# Patient Record
Sex: Male | Born: 1960 | Race: White | Hispanic: No | Marital: Married | Smoking: Current every day smoker
Health system: Southern US, Community
[De-identification: ages and names within clinical notes are randomized; demographics above are authoritative.]

## PROBLEM LIST (undated history)

## (undated) DIAGNOSIS — D6959 Other secondary thrombocytopenia: Secondary | ICD-10-CM

## (undated) DIAGNOSIS — K7030 Alcoholic cirrhosis of liver without ascites: Secondary | ICD-10-CM

## (undated) DIAGNOSIS — G40209 Localization-related (focal) (partial) symptomatic epilepsy and epileptic syndromes with complex partial seizures, not intractable, without status epilepticus: Secondary | ICD-10-CM

## (undated) DIAGNOSIS — F1010 Alcohol abuse, uncomplicated: Secondary | ICD-10-CM

## (undated) DIAGNOSIS — C220 Liver cell carcinoma: Secondary | ICD-10-CM

## (undated) DIAGNOSIS — L03115 Cellulitis of right lower limb: Secondary | ICD-10-CM

## (undated) DIAGNOSIS — E669 Obesity, unspecified: Secondary | ICD-10-CM

## (undated) DIAGNOSIS — H359 Unspecified retinal disorder: Secondary | ICD-10-CM

## (undated) DIAGNOSIS — R569 Unspecified convulsions: Secondary | ICD-10-CM

## (undated) DIAGNOSIS — I10 Essential (primary) hypertension: Secondary | ICD-10-CM

## (undated) DIAGNOSIS — Z22322 Carrier or suspected carrier of Methicillin resistant Staphylococcus aureus: Secondary | ICD-10-CM

## (undated) DIAGNOSIS — F329 Major depressive disorder, single episode, unspecified: Secondary | ICD-10-CM

## (undated) DIAGNOSIS — Z720 Tobacco use: Secondary | ICD-10-CM

## (undated) DIAGNOSIS — Z95828 Presence of other vascular implants and grafts: Secondary | ICD-10-CM

## (undated) DIAGNOSIS — G4730 Sleep apnea, unspecified: Secondary | ICD-10-CM

## (undated) DIAGNOSIS — I719 Aortic aneurysm of unspecified site, without rupture: Secondary | ICD-10-CM

## (undated) DIAGNOSIS — H35381 Toxic maculopathy, right eye: Secondary | ICD-10-CM

## (undated) DIAGNOSIS — H02403 Unspecified ptosis of bilateral eyelids: Secondary | ICD-10-CM

## (undated) DIAGNOSIS — L97909 Non-pressure chronic ulcer of unspecified part of unspecified lower leg with unspecified severity: Secondary | ICD-10-CM

## (undated) DIAGNOSIS — K766 Portal hypertension: Secondary | ICD-10-CM

## (undated) DIAGNOSIS — G621 Alcoholic polyneuropathy: Secondary | ICD-10-CM

## (undated) DIAGNOSIS — F1210 Cannabis abuse, uncomplicated: Secondary | ICD-10-CM

## (undated) DIAGNOSIS — K7682 Hepatic encephalopathy: Secondary | ICD-10-CM

## (undated) DIAGNOSIS — Z961 Presence of intraocular lens: Secondary | ICD-10-CM

## (undated) DIAGNOSIS — N529 Male erectile dysfunction, unspecified: Secondary | ICD-10-CM

## (undated) DIAGNOSIS — J449 Chronic obstructive pulmonary disease, unspecified: Secondary | ICD-10-CM

## (undated) DIAGNOSIS — L03116 Cellulitis of left lower limb: Secondary | ICD-10-CM

## (undated) DIAGNOSIS — Z9109 Other allergy status, other than to drugs and biological substances: Secondary | ICD-10-CM

## (undated) DIAGNOSIS — K7290 Hepatic failure, unspecified without coma: Secondary | ICD-10-CM

## (undated) DIAGNOSIS — E785 Hyperlipidemia, unspecified: Secondary | ICD-10-CM

## (undated) DIAGNOSIS — F419 Anxiety disorder, unspecified: Secondary | ICD-10-CM

## (undated) DIAGNOSIS — F32A Depression, unspecified: Secondary | ICD-10-CM

## (undated) HISTORY — PX: EYE SURGERY: SHX253

## (undated) HISTORY — PX: COLONOSCOPY: SHX174

## (undated) HISTORY — PX: LIVER SURGERY: SHX698

## (undated) HISTORY — PX: OTHER SURGICAL HISTORY: SHX169

## (undated) HISTORY — PX: TIPS PROCEDURE: SHX808

## (undated) HISTORY — PX: ESOPHAGOGASTRODUODENOSCOPY: SHX1529

## (undated) HISTORY — PX: BREAST SURGERY: SHX581

---

## 2007-09-19 ENCOUNTER — Emergency Department (HOSPITAL_COMMUNITY): Admission: EM | Admit: 2007-09-19 | Discharge: 2007-09-19 | Payer: Self-pay | Admitting: Emergency Medicine

## 2007-09-20 ENCOUNTER — Encounter (INDEPENDENT_AMBULATORY_CARE_PROVIDER_SITE_OTHER): Payer: Self-pay | Admitting: Emergency Medicine

## 2007-09-20 ENCOUNTER — Ambulatory Visit (HOSPITAL_COMMUNITY): Admission: RE | Admit: 2007-09-20 | Discharge: 2007-09-20 | Payer: Self-pay | Admitting: Emergency Medicine

## 2007-09-20 ENCOUNTER — Ambulatory Visit: Payer: Self-pay | Admitting: Surgery

## 2008-02-04 ENCOUNTER — Emergency Department (HOSPITAL_COMMUNITY): Admission: EM | Admit: 2008-02-04 | Discharge: 2008-02-05 | Payer: Self-pay | Admitting: Emergency Medicine

## 2010-04-27 LAB — TROPONIN I: Troponin I: <0.01 ng/mL (ref 0.00–0.06)

## 2010-04-27 LAB — URINALYSIS, ROUTINE W REFLEX MICROSCOPIC
Glucose, UA: NEGATIVE mg/dL
Nitrite: NEGATIVE
Protein, ur: NEGATIVE mg/dL
pH: 7 (ref 5.0–8.0)

## 2010-04-27 LAB — BASIC METABOLIC PANEL
BUN: 11 mg/dL (ref 6–23)
CO2: 26 mEq/L (ref 19–32)
Chloride: 108 mEq/L (ref 96–112)
Glucose, Bld: 103 mg/dL — ABNORMAL HIGH (ref 70–99)
Potassium: 4.5 mEq/L (ref 3.5–5.1)

## 2010-04-27 LAB — DIFFERENTIAL
Eosinophils Absolute: 0.3 10*3/uL (ref 0.0–0.7)
Eosinophils Relative: 3 % (ref 0–5)
Lymphocytes Relative: 42 % (ref 12–46)
Lymphs Abs: 3.2 10*3/uL (ref 0.7–4.0)
Monocytes Relative: 12 % (ref 3–12)

## 2010-04-27 LAB — CBC
HCT: 32.3 % — ABNORMAL LOW (ref 39.0–52.0)
MCV: 100.3 fL — ABNORMAL HIGH (ref 78.0–100.0)
Platelets: 109 10*3/uL — ABNORMAL LOW (ref 150–400)
RDW: 13.7 % (ref 11.5–15.5)

## 2010-04-27 LAB — POCT CARDIAC MARKERS
CKMB, poc: 2.2 ng/mL (ref 1.0–8.0)
CKMB, poc: 2.4 ng/mL (ref 1.0–8.0)
Myoglobin, poc: 52.5 ng/mL (ref 12–200)

## 2010-04-27 LAB — D-DIMER, QUANTITATIVE: D-Dimer, Quant: 0.27 ug/mL-FEU (ref 0.00–0.48)

## 2010-04-27 LAB — PROTIME-INR: INR: 1.2 (ref 0.00–1.49)

## 2010-05-25 ENCOUNTER — Inpatient Hospital Stay: Payer: Self-pay | Admitting: Internal Medicine

## 2010-05-25 DIAGNOSIS — R7989 Other specified abnormal findings of blood chemistry: Secondary | ICD-10-CM

## 2010-05-25 DIAGNOSIS — I517 Cardiomegaly: Secondary | ICD-10-CM

## 2010-07-12 ENCOUNTER — Emergency Department: Payer: Self-pay | Admitting: Emergency Medicine

## 2010-07-19 ENCOUNTER — Inpatient Hospital Stay: Payer: Self-pay | Admitting: Internal Medicine

## 2010-07-20 DIAGNOSIS — R7989 Other specified abnormal findings of blood chemistry: Secondary | ICD-10-CM

## 2010-08-25 ENCOUNTER — Emergency Department: Payer: Self-pay | Admitting: Emergency Medicine

## 2010-10-14 LAB — COMPREHENSIVE METABOLIC PANEL
ALT: 59 — ABNORMAL HIGH
AST: 73 — ABNORMAL HIGH
Albumin: 3.4 — ABNORMAL LOW
CO2: 28
Chloride: 106
Creatinine, Ser: 0.5
GFR calc Af Amer: >60
GFR calc non Af Amer: >60
Potassium: 4.1
Sodium: 140
Total Bilirubin: 2.2 — ABNORMAL HIGH

## 2010-10-14 LAB — URINALYSIS, ROUTINE W REFLEX MICROSCOPIC
Hgb urine dipstick: NEGATIVE
Nitrite: NEGATIVE
Protein, ur: NEGATIVE
Specific Gravity, Urine: 1.012
Urobilinogen, UA: 1

## 2010-10-14 LAB — CBC
Platelets: 108 — ABNORMAL LOW
RBC: 3.79 — ABNORMAL LOW
WBC: 6.3

## 2010-10-14 LAB — AMMONIA: Ammonia: 63 — ABNORMAL HIGH

## 2010-10-14 LAB — DIFFERENTIAL
Basophils Absolute: 0.1
Eosinophils Absolute: 0.2
Eosinophils Relative: 3
Lymphocytes Relative: 38
Monocytes Absolute: 0.9

## 2010-10-14 LAB — APTT: aPTT: 36

## 2011-05-28 ENCOUNTER — Emergency Department: Payer: Self-pay | Admitting: Emergency Medicine

## 2011-05-28 LAB — CBC
HCT: 42.2 % (ref 40.0–52.0)
HGB: 14.7 g/dL (ref 13.0–18.0)
MCH: 37.6 pg — ABNORMAL HIGH (ref 26.0–34.0)
MCV: 108 fL — ABNORMAL HIGH (ref 80–100)
RBC: 3.91 10*6/uL — ABNORMAL LOW (ref 4.40–5.90)
RDW: 14.3 % (ref 11.5–14.5)

## 2011-05-28 LAB — URINALYSIS, COMPLETE
Bilirubin,UR: NEGATIVE
Ketone: NEGATIVE
Nitrite: NEGATIVE
Ph: 6 (ref 4.5–8.0)
Protein: 30
RBC,UR: 2 /HPF (ref 0–5)
Specific Gravity: 1.017 (ref 1.003–1.030)
Squamous Epithelial: NONE SEEN

## 2011-05-28 LAB — DRUG SCREEN, URINE
Cannabinoid 50 Ng, Ur ~~LOC~~: POSITIVE (ref ?–50)
Cocaine Metabolite,Ur ~~LOC~~: NEGATIVE (ref ?–300)
Methadone, Ur Screen: NEGATIVE (ref ?–300)
Phencyclidine (PCP) Ur S: NEGATIVE (ref ?–25)

## 2011-05-28 LAB — COMPREHENSIVE METABOLIC PANEL
Albumin: 3 g/dL — ABNORMAL LOW (ref 3.4–5.0)
Anion Gap: 9 (ref 7–16)
Chloride: 105 mmol/L (ref 98–107)
Co2: 27 mmol/L (ref 21–32)
Creatinine: 0.57 mg/dL — ABNORMAL LOW (ref 0.60–1.30)
EGFR (African American): >60
EGFR (Non-African Amer.): >60
Glucose: 102 mg/dL — ABNORMAL HIGH (ref 65–99)
Osmolality: 278 (ref 275–301)
SGOT(AST): 123 U/L — ABNORMAL HIGH (ref 15–37)
SGPT (ALT): 61 U/L
Total Protein: 7.6 g/dL (ref 6.4–8.2)

## 2011-05-28 LAB — SALICYLATE LEVEL: Salicylates, Serum: <1.7 mg/dL

## 2011-05-28 LAB — ETHANOL
Ethanol %: 0.261 % — ABNORMAL HIGH (ref 0.000–0.080)
Ethanol: 261 mg/dL
Ethanol: 155 mg/dL

## 2011-07-10 ENCOUNTER — Inpatient Hospital Stay: Payer: Self-pay | Admitting: Internal Medicine

## 2011-07-10 LAB — COMPREHENSIVE METABOLIC PANEL
Albumin: 2.7 g/dL — ABNORMAL LOW (ref 3.4–5.0)
Alkaline Phosphatase: 154 U/L — ABNORMAL HIGH (ref 50–136)
Anion Gap: 9 (ref 7–16)
Co2: 27 mmol/L (ref 21–32)
Creatinine: 0.58 mg/dL — ABNORMAL LOW (ref 0.60–1.30)
EGFR (African American): >60
Glucose: 123 mg/dL — ABNORMAL HIGH (ref 65–99)
Osmolality: 277 (ref 275–301)
Potassium: 3.3 mmol/L — ABNORMAL LOW (ref 3.5–5.1)
SGPT (ALT): 51 U/L

## 2011-07-10 LAB — CBC
HGB: 12.3 g/dL — ABNORMAL LOW (ref 13.0–18.0)
MCH: 37.4 pg — ABNORMAL HIGH (ref 26.0–34.0)
MCV: 105 fL — ABNORMAL HIGH (ref 80–100)
RDW: 13.7 % (ref 11.5–14.5)
WBC: 5.8 10*3/uL (ref 3.8–10.6)

## 2011-07-10 LAB — URINALYSIS, COMPLETE
Bilirubin,UR: NEGATIVE
Glucose,UR: NEGATIVE mg/dL (ref 0–75)
Ph: 8 (ref 4.5–8.0)
RBC,UR: 4 /HPF (ref 0–5)
Specific Gravity: 1.024 (ref 1.003–1.030)
Squamous Epithelial: NONE SEEN

## 2011-07-10 LAB — DRUG SCREEN, URINE
Amphetamines, Ur Screen: NEGATIVE (ref ?–1000)
Cocaine Metabolite,Ur ~~LOC~~: NEGATIVE (ref ?–300)
MDMA (Ecstasy)Ur Screen: NEGATIVE (ref ?–500)
Methadone, Ur Screen: NEGATIVE (ref ?–300)
Opiate, Ur Screen: NEGATIVE (ref ?–300)
Tricyclic, Ur Screen: NEGATIVE (ref ?–1000)

## 2011-07-10 LAB — ACETAMINOPHEN LEVEL: Acetaminophen: <2 ug/mL

## 2011-07-10 LAB — ETHANOL
Ethanol %: <0.003 % (ref 0.000–0.080)
Ethanol: <3 mg/dL

## 2011-07-10 LAB — MAGNESIUM: Magnesium: 1.8 mg/dL

## 2011-07-10 LAB — CK TOTAL AND CKMB (NOT AT ARMC)
CK, Total: 906 U/L — ABNORMAL HIGH (ref 35–232)
CK-MB: 3.9 ng/mL — ABNORMAL HIGH (ref 0.5–3.6)
CK-MB: 3.8 ng/mL — ABNORMAL HIGH (ref 0.5–3.6)

## 2011-07-10 LAB — TROPONIN I: Troponin-I: 0.26 ng/mL — ABNORMAL HIGH

## 2011-07-11 LAB — CBC WITH DIFFERENTIAL/PLATELET
Basophil #: 0.2 10*3/uL — ABNORMAL HIGH (ref 0.0–0.1)
Basophil %: 3.3 %
Eosinophil #: 0 10*3/uL (ref 0.0–0.7)
HCT: 30.1 % — ABNORMAL LOW (ref 40.0–52.0)
HGB: 10.6 g/dL — ABNORMAL LOW (ref 13.0–18.0)
Lymphocyte %: 32.5 %
MCH: 36.8 pg — ABNORMAL HIGH (ref 26.0–34.0)
Monocyte %: 11.7 %
Neutrophil %: 51.7 %
RBC: 2.89 10*6/uL — ABNORMAL LOW (ref 4.40–5.90)
RDW: 14.5 % (ref 11.5–14.5)
WBC: 4.7 10*3/uL (ref 3.8–10.6)

## 2011-07-11 LAB — COMPREHENSIVE METABOLIC PANEL
Anion Gap: 7 (ref 7–16)
BUN: 6 mg/dL — ABNORMAL LOW (ref 7–18)
Bilirubin,Total: 8.8 mg/dL — ABNORMAL HIGH (ref 0.2–1.0)
Calcium, Total: 7.5 mg/dL — ABNORMAL LOW (ref 8.5–10.1)
Creatinine: 0.74 mg/dL (ref 0.60–1.30)
EGFR (African American): >60
EGFR (Non-African Amer.): >60
Osmolality: 285 (ref 275–301)
SGPT (ALT): 44 U/L

## 2011-07-11 LAB — MAGNESIUM: Magnesium: 1.6 mg/dL — ABNORMAL LOW

## 2011-07-11 LAB — CK TOTAL AND CKMB (NOT AT ARMC): CK, Total: 590 U/L — ABNORMAL HIGH (ref 35–232)

## 2011-07-11 LAB — PHENYTOIN LEVEL, TOTAL: Dilantin: 8.7 ug/mL — ABNORMAL LOW (ref 10.0–20.0)

## 2011-07-11 LAB — POTASSIUM: Potassium: 3.6 mmol/L (ref 3.5–5.1)

## 2011-07-12 ENCOUNTER — Ambulatory Visit: Payer: Self-pay | Admitting: Internal Medicine

## 2011-07-12 LAB — CBC WITH DIFFERENTIAL/PLATELET
Basophil #: 0.1 10*3/uL (ref 0.0–0.1)
Eosinophil #: 0 10*3/uL (ref 0.0–0.7)
Eosinophil %: 1 %
HCT: 29.5 % — ABNORMAL LOW (ref 40.0–52.0)
Lymphocyte %: 28.1 %
MCH: 36.8 pg — ABNORMAL HIGH (ref 26.0–34.0)
MCHC: 34.6 g/dL (ref 32.0–36.0)
Monocyte %: 11.8 %
RDW: 14.8 % — ABNORMAL HIGH (ref 11.5–14.5)

## 2011-07-12 LAB — BASIC METABOLIC PANEL
BUN: 4 mg/dL — ABNORMAL LOW (ref 7–18)
Co2: 24 mmol/L (ref 21–32)
Creatinine: 0.66 mg/dL (ref 0.60–1.30)
EGFR (African American): >60
EGFR (Non-African Amer.): >60
Glucose: 108 mg/dL — ABNORMAL HIGH (ref 65–99)
Sodium: 141 mmol/L (ref 136–145)

## 2011-07-13 LAB — BASIC METABOLIC PANEL
BUN: 4 mg/dL — ABNORMAL LOW (ref 7–18)
Calcium, Total: 7.3 mg/dL — ABNORMAL LOW (ref 8.5–10.1)
Chloride: 109 mmol/L — ABNORMAL HIGH (ref 98–107)
Creatinine: 0.26 mg/dL — ABNORMAL LOW (ref 0.60–1.30)
EGFR (African American): >60
EGFR (Non-African Amer.): >60
Osmolality: 271 (ref 275–301)
Potassium: 3.8 mmol/L (ref 3.5–5.1)

## 2011-07-13 LAB — URINALYSIS, COMPLETE
Ketone: NEGATIVE
Leukocyte Esterase: NEGATIVE
Protein: NEGATIVE
Specific Gravity: 1.018 (ref 1.003–1.030)
Squamous Epithelial: NONE SEEN

## 2011-07-14 LAB — BASIC METABOLIC PANEL
BUN: 5 mg/dL — ABNORMAL LOW (ref 7–18)
Calcium, Total: 7.5 mg/dL — ABNORMAL LOW (ref 8.5–10.1)
Chloride: 104 mmol/L (ref 98–107)
Co2: 25 mmol/L (ref 21–32)
Creatinine: 0.88 mg/dL (ref 0.60–1.30)
EGFR (African American): >60
Sodium: 134 mmol/L — ABNORMAL LOW (ref 136–145)

## 2011-07-14 LAB — CBC WITH DIFFERENTIAL/PLATELET
Basophil #: 0.1 10*3/uL (ref 0.0–0.1)
Eosinophil %: 0.8 %
HGB: 10.4 g/dL — ABNORMAL LOW (ref 13.0–18.0)
Lymphocyte %: 22 %
Monocyte %: 19.7 %
Neutrophil %: 56.4 %
Platelet: 29 10*3/uL — CL (ref 150–440)
RBC: 2.87 10*6/uL — ABNORMAL LOW (ref 4.40–5.90)
RDW: 14.9 % — ABNORMAL HIGH (ref 11.5–14.5)
WBC: 4.8 10*3/uL (ref 3.8–10.6)

## 2011-07-14 LAB — URINE CULTURE

## 2011-07-15 LAB — CBC WITH DIFFERENTIAL/PLATELET
Basophil #: 0.1 10*3/uL (ref 0.0–0.1)
Eosinophil #: 0.1 10*3/uL (ref 0.0–0.7)
Lymphocyte #: 1.2 10*3/uL (ref 1.0–3.6)
Lymphocyte %: 23.6 %
MCH: 36.8 pg — ABNORMAL HIGH (ref 26.0–34.0)
MCHC: 34.4 g/dL (ref 32.0–36.0)
Neutrophil #: 2.5 10*3/uL (ref 1.4–6.5)
Neutrophil %: 48.2 %
Platelet: 34 10*3/uL — ABNORMAL LOW (ref 150–440)
RDW: 15.2 % — ABNORMAL HIGH (ref 11.5–14.5)
WBC: 5.1 10*3/uL (ref 3.8–10.6)

## 2011-07-15 LAB — SODIUM: Sodium: 138 mmol/L (ref 136–145)

## 2011-07-15 LAB — POTASSIUM: Potassium: 4.1 mmol/L (ref 3.5–5.1)

## 2011-07-16 LAB — CULTURE, BLOOD (SINGLE)

## 2011-07-17 LAB — CBC WITH DIFFERENTIAL/PLATELET
Basophil #: 0.1 10*3/uL (ref 0.0–0.1)
Eosinophil #: 0.1 10*3/uL (ref 0.0–0.7)
HGB: 9.3 g/dL — ABNORMAL LOW (ref 13.0–18.0)
Lymphocyte #: 1.9 10*3/uL (ref 1.0–3.6)
Lymphocyte %: 37 %
MCH: 37.1 pg — ABNORMAL HIGH (ref 26.0–34.0)
MCHC: 34.6 g/dL (ref 32.0–36.0)
Monocyte %: 19.9 %
Neutrophil #: 2 10*3/uL (ref 1.4–6.5)
Neutrophil %: 39.1 %
Platelet: 67 10*3/uL — ABNORMAL LOW (ref 150–440)
RDW: 15.3 % — ABNORMAL HIGH (ref 11.5–14.5)

## 2011-07-17 LAB — CULTURE, BLOOD (SINGLE)

## 2011-07-21 LAB — CULTURE, BLOOD (SINGLE)

## 2011-07-21 LAB — EXPECTORATED SPUTUM ASSESSMENT W GRAM STAIN, RFLX TO RESP C

## 2011-08-12 ENCOUNTER — Ambulatory Visit: Payer: Self-pay | Admitting: Internal Medicine

## 2011-10-19 HISTORY — PX: CATARACT EXTRACTION: SUR2

## 2011-11-14 ENCOUNTER — Emergency Department: Payer: Self-pay | Admitting: Internal Medicine

## 2011-11-14 LAB — DRUG SCREEN, URINE
Amphetamines, Ur Screen: NEGATIVE (ref ?–1000)
Barbiturates, Ur Screen: NEGATIVE (ref ?–200)
Cocaine Metabolite,Ur ~~LOC~~: NEGATIVE (ref ?–300)
Methadone, Ur Screen: NEGATIVE (ref ?–300)
Opiate, Ur Screen: NEGATIVE (ref ?–300)
Phencyclidine (PCP) Ur S: NEGATIVE (ref ?–25)
Tricyclic, Ur Screen: NEGATIVE (ref ?–1000)

## 2011-12-19 ENCOUNTER — Emergency Department: Payer: Self-pay | Admitting: Emergency Medicine

## 2011-12-19 LAB — COMPREHENSIVE METABOLIC PANEL
Albumin: 2.8 g/dL — ABNORMAL LOW (ref 3.4–5.0)
Anion Gap: 5 — ABNORMAL LOW (ref 7–16)
BUN: 3 mg/dL — ABNORMAL LOW (ref 7–18)
Calcium, Total: 8.2 mg/dL — ABNORMAL LOW (ref 8.5–10.1)
EGFR (African American): >60
Glucose: 104 mg/dL — ABNORMAL HIGH (ref 65–99)
Potassium: 4 mmol/L (ref 3.5–5.1)
SGOT(AST): 30 U/L (ref 15–37)
Sodium: 140 mmol/L (ref 136–145)

## 2011-12-19 LAB — CBC
HCT: 38.1 % — ABNORMAL LOW (ref 40.0–52.0)
HGB: 13.4 g/dL (ref 13.0–18.0)
MCH: 35.5 pg — ABNORMAL HIGH (ref 26.0–34.0)
MCHC: 35.1 g/dL (ref 32.0–36.0)
MCV: 101 fL — ABNORMAL HIGH (ref 80–100)
RDW: 14.4 % (ref 11.5–14.5)

## 2011-12-19 LAB — AMMONIA: Ammonia, Plasma: 59 mcmol/L — ABNORMAL HIGH (ref 11–32)

## 2012-02-22 HISTORY — PX: CATARACT EXTRACTION: SUR2

## 2012-03-04 ENCOUNTER — Emergency Department: Payer: Self-pay | Admitting: Emergency Medicine

## 2012-03-04 LAB — HEPATIC FUNCTION PANEL A (ARMC)
Albumin: 2.9 g/dL — ABNORMAL LOW (ref 3.4–5.0)
Bilirubin, Direct: 0.6 mg/dL — ABNORMAL HIGH (ref 0.00–0.20)
Bilirubin,Total: 1.7 mg/dL — ABNORMAL HIGH (ref 0.2–1.0)
SGOT(AST): 47 U/L — ABNORMAL HIGH (ref 15–37)
SGPT (ALT): 28 U/L (ref 12–78)
Total Protein: 6 g/dL — ABNORMAL LOW (ref 6.4–8.2)

## 2012-03-04 LAB — BASIC METABOLIC PANEL
Anion Gap: 7 (ref 7–16)
Chloride: 113 mmol/L — ABNORMAL HIGH (ref 98–107)
Co2: 25 mmol/L (ref 21–32)
Creatinine: 0.73 mg/dL (ref 0.60–1.30)
EGFR (African American): >60
EGFR (Non-African Amer.): >60
Sodium: 145 mmol/L (ref 136–145)

## 2012-03-04 LAB — CBC
HCT: 35.1 % — ABNORMAL LOW (ref 40.0–52.0)
MCH: 34.6 pg — ABNORMAL HIGH (ref 26.0–34.0)
MCHC: 34.8 g/dL (ref 32.0–36.0)
MCV: 99 fL (ref 80–100)
Platelet: 84 10*3/uL — ABNORMAL LOW (ref 150–440)
RBC: 3.54 10*6/uL — ABNORMAL LOW (ref 4.40–5.90)

## 2012-03-04 LAB — PROTIME-INR
INR: 1.2
Prothrombin Time: 16 secs — ABNORMAL HIGH (ref 11.5–14.7)

## 2012-03-04 LAB — APTT: Activated PTT: 36.9 secs — ABNORMAL HIGH (ref 23.6–35.9)

## 2012-05-08 ENCOUNTER — Emergency Department: Payer: Self-pay | Admitting: Emergency Medicine

## 2012-05-08 LAB — CBC WITH DIFFERENTIAL/PLATELET
Basophil %: 1.3 %
Eosinophil #: 0.5 10*3/uL (ref 0.0–0.7)
Eosinophil %: 6.8 %
HCT: 37 % — ABNORMAL LOW (ref 40.0–52.0)
HGB: 12.9 g/dL — ABNORMAL LOW (ref 13.0–18.0)
Lymphocyte %: 40.5 %
MCH: 34.8 pg — ABNORMAL HIGH (ref 26.0–34.0)
MCHC: 34.9 g/dL (ref 32.0–36.0)
MCV: 100 fL (ref 80–100)
Monocyte #: 1 x10 3/mm (ref 0.2–1.0)
Monocyte %: 13.4 %
Neutrophil #: 2.7 10*3/uL (ref 1.4–6.5)
Neutrophil %: 38 %
Platelet: 87 10*3/uL — ABNORMAL LOW (ref 150–440)
RBC: 3.71 10*6/uL — ABNORMAL LOW (ref 4.40–5.90)
RDW: 15.5 % — ABNORMAL HIGH (ref 11.5–14.5)
WBC: 7.2 10*3/uL (ref 3.8–10.6)

## 2012-05-08 LAB — COMPREHENSIVE METABOLIC PANEL
Albumin: 3.2 g/dL — ABNORMAL LOW (ref 3.4–5.0)
Alkaline Phosphatase: 134 U/L (ref 50–136)
Bilirubin,Total: 2.9 mg/dL — ABNORMAL HIGH (ref 0.2–1.0)
Calcium, Total: 8.7 mg/dL (ref 8.5–10.1)
Co2: 30 mmol/L (ref 21–32)
EGFR (African American): >60
EGFR (Non-African Amer.): >60
Glucose: 92 mg/dL (ref 65–99)
SGOT(AST): 42 U/L — ABNORMAL HIGH (ref 15–37)
SGPT (ALT): 31 U/L (ref 12–78)
Total Protein: 6.3 g/dL — ABNORMAL LOW (ref 6.4–8.2)

## 2012-08-31 ENCOUNTER — Emergency Department: Payer: Self-pay | Admitting: Unknown Physician Specialty

## 2012-08-31 LAB — DRUG SCREEN, URINE
Benzodiazepine, Ur Scrn: NEGATIVE (ref ?–200)
Cocaine Metabolite,Ur ~~LOC~~: NEGATIVE (ref ?–300)
Methadone, Ur Screen: NEGATIVE (ref ?–300)
Opiate, Ur Screen: NEGATIVE (ref ?–300)
Tricyclic, Ur Screen: NEGATIVE (ref ?–1000)

## 2012-08-31 LAB — CBC
HCT: 38.9 % — ABNORMAL LOW (ref 40.0–52.0)
MCHC: 36.1 g/dL — ABNORMAL HIGH (ref 32.0–36.0)
Platelet: 116 10*3/uL — ABNORMAL LOW (ref 150–440)
RBC: 3.89 10*6/uL — ABNORMAL LOW (ref 4.40–5.90)
RDW: 14 % (ref 11.5–14.5)
WBC: 10 10*3/uL (ref 3.8–10.6)

## 2012-08-31 LAB — COMPREHENSIVE METABOLIC PANEL
Albumin: 3.2 g/dL — ABNORMAL LOW (ref 3.4–5.0)
Anion Gap: 8 (ref 7–16)
BUN: 8 mg/dL (ref 7–18)
Calcium, Total: 8.6 mg/dL (ref 8.5–10.1)
Co2: 26 mmol/L (ref 21–32)
Creatinine: 0.66 mg/dL (ref 0.60–1.30)
Glucose: 85 mg/dL (ref 65–99)
Osmolality: 283 (ref 275–301)
Potassium: 3.5 mmol/L (ref 3.5–5.1)
Sodium: 143 mmol/L (ref 136–145)
Total Protein: 6.4 g/dL (ref 6.4–8.2)

## 2012-08-31 LAB — URINALYSIS, COMPLETE
Bilirubin,UR: NEGATIVE
Blood: NEGATIVE
Ketone: NEGATIVE
Leukocyte Esterase: NEGATIVE
Nitrite: NEGATIVE
Protein: 30

## 2012-08-31 LAB — TSH: Thyroid Stimulating Horm: 0.32 u[IU]/mL — ABNORMAL LOW

## 2012-08-31 LAB — ETHANOL: Ethanol: 362 mg/dL

## 2012-09-02 ENCOUNTER — Emergency Department: Payer: Self-pay | Admitting: Emergency Medicine

## 2012-09-02 LAB — COMPREHENSIVE METABOLIC PANEL
Anion Gap: 8 (ref 7–16)
BUN: 5 mg/dL — ABNORMAL LOW (ref 7–18)
Calcium, Total: 8 mg/dL — ABNORMAL LOW (ref 8.5–10.1)
EGFR (African American): >60
EGFR (Non-African Amer.): >60
Glucose: 109 mg/dL — ABNORMAL HIGH (ref 65–99)
Potassium: 3.3 mmol/L — ABNORMAL LOW (ref 3.5–5.1)
SGPT (ALT): 44 U/L (ref 12–78)
Total Protein: 6.7 g/dL (ref 6.4–8.2)

## 2012-09-02 LAB — CBC
HCT: 39 % — ABNORMAL LOW (ref 40.0–52.0)
MCH: 36 pg — ABNORMAL HIGH (ref 26.0–34.0)
MCHC: 36.2 g/dL — ABNORMAL HIGH (ref 32.0–36.0)
WBC: 8.6 10*3/uL (ref 3.8–10.6)

## 2012-09-02 LAB — URINALYSIS, COMPLETE
Bacteria: NONE SEEN
Blood: NEGATIVE
Ketone: NEGATIVE
Nitrite: NEGATIVE
Ph: 5 (ref 4.5–8.0)
RBC,UR: 1 /HPF (ref 0–5)
Specific Gravity: 1.01 (ref 1.003–1.030)
WBC UR: 2 /HPF (ref 0–5)

## 2012-09-02 LAB — ETHANOL
Ethanol %: 0.399 % (ref 0.000–0.080)
Ethanol: 399 mg/dL

## 2012-09-03 LAB — ETHANOL: Ethanol %: 0.184 % — ABNORMAL HIGH (ref 0.000–0.080)

## 2012-10-21 ENCOUNTER — Inpatient Hospital Stay: Payer: Self-pay | Admitting: Internal Medicine

## 2012-10-21 LAB — COMPREHENSIVE METABOLIC PANEL
Bilirubin,Total: 1.6 mg/dL — ABNORMAL HIGH (ref 0.2–1.0)
Chloride: 108 mmol/L — ABNORMAL HIGH (ref 98–107)
EGFR (Non-African Amer.): >60
Glucose: 182 mg/dL — ABNORMAL HIGH (ref 65–99)
Osmolality: 281 (ref 275–301)
Potassium: 3.6 mmol/L (ref 3.5–5.1)
SGOT(AST): 38 U/L — ABNORMAL HIGH (ref 15–37)
Sodium: 139 mmol/L (ref 136–145)
Total Protein: 5.6 g/dL — ABNORMAL LOW (ref 6.4–8.2)

## 2012-10-21 LAB — PROTIME-INR: INR: 1.3

## 2012-10-21 LAB — CBC
HCT: 32.9 % — ABNORMAL LOW (ref 40.0–52.0)
Platelet: 67 10*3/uL — ABNORMAL LOW (ref 150–440)
WBC: 6 10*3/uL (ref 3.8–10.6)

## 2012-10-22 LAB — CBC WITH DIFFERENTIAL/PLATELET
Basophil #: 0.1 10*3/uL (ref 0.0–0.1)
Basophil %: 0.9 %
HCT: 30.9 % — ABNORMAL LOW (ref 40.0–52.0)
HGB: 11.3 g/dL — ABNORMAL LOW (ref 13.0–18.0)
Lymphocyte #: 2.3 10*3/uL (ref 1.0–3.6)
MCV: 97 fL (ref 80–100)
Monocyte #: 1.1 x10 3/mm — ABNORMAL HIGH (ref 0.2–1.0)
Monocyte %: 15.1 %
Neutrophil #: 3.3 10*3/uL (ref 1.4–6.5)
RBC: 3.19 10*6/uL — ABNORMAL LOW (ref 4.40–5.90)
RDW: 13.9 % (ref 11.5–14.5)
WBC: 7.3 10*3/uL (ref 3.8–10.6)

## 2012-10-22 LAB — BASIC METABOLIC PANEL
BUN: 7 mg/dL (ref 7–18)
Calcium, Total: 7.5 mg/dL — ABNORMAL LOW (ref 8.5–10.1)
Co2: 26 mmol/L (ref 21–32)
EGFR (African American): >60
Glucose: 113 mg/dL — ABNORMAL HIGH (ref 65–99)
Osmolality: 273 (ref 275–301)
Potassium: 3.4 mmol/L — ABNORMAL LOW (ref 3.5–5.1)

## 2012-10-23 LAB — BASIC METABOLIC PANEL
Anion Gap: 4 — ABNORMAL LOW (ref 7–16)
BUN: 8 mg/dL (ref 7–18)
Calcium, Total: 8.3 mg/dL — ABNORMAL LOW (ref 8.5–10.1)
Co2: 28 mmol/L (ref 21–32)
EGFR (African American): >60
Glucose: 100 mg/dL — ABNORMAL HIGH (ref 65–99)
Osmolality: 278 (ref 275–301)
Potassium: 3.5 mmol/L (ref 3.5–5.1)
Sodium: 140 mmol/L (ref 136–145)

## 2012-10-23 LAB — CBC WITH DIFFERENTIAL/PLATELET
Basophil %: 0.8 %
Eosinophil #: 0.4 10*3/uL (ref 0.0–0.7)
HCT: 29.4 % — ABNORMAL LOW (ref 40.0–52.0)
HGB: 10.9 g/dL — ABNORMAL LOW (ref 13.0–18.0)
Monocyte %: 16.1 %
Neutrophil #: 2.7 10*3/uL (ref 1.4–6.5)
Platelet: 70 10*3/uL — ABNORMAL LOW (ref 150–440)
RBC: 3.09 10*6/uL — ABNORMAL LOW (ref 4.40–5.90)
RDW: 13.5 % (ref 11.5–14.5)
WBC: 6.1 10*3/uL (ref 3.8–10.6)

## 2012-10-23 LAB — VANCOMYCIN, TROUGH: Vancomycin, Trough: 6 ug/mL — ABNORMAL LOW (ref 10–20)

## 2012-10-24 ENCOUNTER — Emergency Department: Payer: Self-pay | Admitting: Emergency Medicine

## 2012-10-24 LAB — URINALYSIS, COMPLETE
Bacteria: NONE SEEN
Bilirubin,UR: NEGATIVE
Leukocyte Esterase: NEGATIVE
Ph: 6 (ref 4.5–8.0)
Protein: NEGATIVE
RBC,UR: 8 /HPF (ref 0–5)
Specific Gravity: 1.028 (ref 1.003–1.030)
Squamous Epithelial: NONE SEEN
WBC UR: 1 /HPF (ref 0–5)

## 2012-10-24 LAB — CBC
HGB: 11.3 g/dL — ABNORMAL LOW (ref 13.0–18.0)
MCH: 34.8 pg — ABNORMAL HIGH (ref 26.0–34.0)
MCHC: 36.3 g/dL — ABNORMAL HIGH (ref 32.0–36.0)
MCV: 96 fL (ref 80–100)
RBC: 3.25 10*6/uL — ABNORMAL LOW (ref 4.40–5.90)
RDW: 13.5 % (ref 11.5–14.5)
WBC: 8.8 10*3/uL (ref 3.8–10.6)

## 2012-10-24 LAB — COMPREHENSIVE METABOLIC PANEL
Albumin: 2.5 g/dL — ABNORMAL LOW (ref 3.4–5.0)
BUN: 8 mg/dL (ref 7–18)
Co2: 26 mmol/L (ref 21–32)
EGFR (Non-African Amer.): >60
SGOT(AST): 40 U/L — ABNORMAL HIGH (ref 15–37)
SGPT (ALT): 26 U/L (ref 12–78)

## 2012-10-26 LAB — CULTURE, BLOOD (SINGLE)

## 2013-04-06 ENCOUNTER — Emergency Department: Payer: Self-pay | Admitting: Emergency Medicine

## 2013-04-06 LAB — BASIC METABOLIC PANEL
ANION GAP: 5 — AB (ref 7–16)
BUN: 5 mg/dL — AB (ref 7–18)
CALCIUM: 8.8 mg/dL (ref 8.5–10.1)
CHLORIDE: 108 mmol/L — AB (ref 98–107)
Co2: 26 mmol/L (ref 21–32)
Creatinine: 0.72 mg/dL (ref 0.60–1.30)
EGFR (African American): >60
EGFR (Non-African Amer.): >60
Glucose: 103 mg/dL — ABNORMAL HIGH (ref 65–99)
OSMOLALITY: 275 (ref 275–301)
Potassium: 4.1 mmol/L (ref 3.5–5.1)
Sodium: 139 mmol/L (ref 136–145)

## 2013-04-06 LAB — CBC WITH DIFFERENTIAL/PLATELET
Basophil #: 0.1 10*3/uL (ref 0.0–0.1)
Basophil %: 1 %
EOS ABS: 0.2 10*3/uL (ref 0.0–0.7)
EOS PCT: 2.6 %
HCT: 40.3 % (ref 40.0–52.0)
HGB: 14.2 g/dL (ref 13.0–18.0)
Lymphocyte #: 1.6 10*3/uL (ref 1.0–3.6)
Lymphocyte %: 26.7 %
MCH: 33.1 pg (ref 26.0–34.0)
MCHC: 35.2 g/dL (ref 32.0–36.0)
MCV: 94 fL (ref 80–100)
MONOS PCT: 9.7 %
Monocyte #: 0.6 x10 3/mm (ref 0.2–1.0)
Neutrophil #: 3.7 10*3/uL (ref 1.4–6.5)
Neutrophil %: 60 %
Platelet: 122 10*3/uL — ABNORMAL LOW (ref 150–440)
RBC: 4.28 10*6/uL — AB (ref 4.40–5.90)
RDW: 15.2 % — ABNORMAL HIGH (ref 11.5–14.5)
WBC: 6.2 10*3/uL (ref 3.8–10.6)

## 2013-04-06 LAB — URINALYSIS, COMPLETE
BACTERIA: NONE SEEN
Bilirubin,UR: NEGATIVE
Blood: NEGATIVE
Glucose,UR: NEGATIVE mg/dL (ref 0–75)
KETONE: NEGATIVE
Leukocyte Esterase: NEGATIVE
Nitrite: NEGATIVE
PH: 8 (ref 4.5–8.0)
Protein: 30
RBC,UR: 1 /HPF (ref 0–5)
SPECIFIC GRAVITY: 1.02 (ref 1.003–1.030)
Squamous Epithelial: NONE SEEN
WBC UR: 1 /HPF (ref 0–5)

## 2013-04-06 LAB — ETHANOL: Ethanol %: <0.003 % (ref 0.000–0.080)

## 2013-04-06 LAB — DRUG SCREEN, URINE
Amphetamines, Ur Screen: NEGATIVE (ref ?–1000)
BENZODIAZEPINE, UR SCRN: NEGATIVE (ref ?–200)
Barbiturates, Ur Screen: NEGATIVE (ref ?–200)
CANNABINOID 50 NG, UR ~~LOC~~: NEGATIVE (ref ?–50)
Cocaine Metabolite,Ur ~~LOC~~: NEGATIVE (ref ?–300)
MDMA (ECSTASY) UR SCREEN: NEGATIVE (ref ?–500)
METHADONE, UR SCREEN: NEGATIVE (ref ?–300)
OPIATE, UR SCREEN: NEGATIVE (ref ?–300)
Phencyclidine (PCP) Ur S: NEGATIVE (ref ?–25)
Tricyclic, Ur Screen: NEGATIVE (ref ?–1000)

## 2013-04-06 LAB — AMMONIA: Ammonia, Plasma: 86 mcmol/L — ABNORMAL HIGH (ref 11–32)

## 2013-04-06 LAB — TROPONIN I: TROPONIN-I: 0.13 ng/mL — AB

## 2013-06-06 DIAGNOSIS — Z9109 Other allergy status, other than to drugs and biological substances: Secondary | ICD-10-CM

## 2013-06-06 HISTORY — DX: Other allergy status, other than to drugs and biological substances: Z91.09

## 2013-08-16 ENCOUNTER — Inpatient Hospital Stay: Payer: Self-pay | Admitting: Internal Medicine

## 2013-08-16 LAB — COMPREHENSIVE METABOLIC PANEL
ALK PHOS: 109 U/L
ALT: 39 U/L
Albumin: 2.9 g/dL — ABNORMAL LOW (ref 3.4–5.0)
Anion Gap: 7 (ref 7–16)
BILIRUBIN TOTAL: 1.8 mg/dL — AB (ref 0.2–1.0)
BUN: 7 mg/dL (ref 7–18)
CALCIUM: 8.2 mg/dL — AB (ref 8.5–10.1)
CHLORIDE: 109 mmol/L — AB (ref 98–107)
Co2: 24 mmol/L (ref 21–32)
Creatinine: 0.69 mg/dL (ref 0.60–1.30)
EGFR (Non-African Amer.): >60
GLUCOSE: 110 mg/dL — AB (ref 65–99)
Osmolality: 278 (ref 275–301)
POTASSIUM: 4.2 mmol/L (ref 3.5–5.1)
SGOT(AST): 42 U/L — ABNORMAL HIGH (ref 15–37)
Sodium: 140 mmol/L (ref 136–145)
TOTAL PROTEIN: 6.2 g/dL — AB (ref 6.4–8.2)

## 2013-08-16 LAB — URINALYSIS, COMPLETE
Bacteria: NONE SEEN
Bilirubin,UR: NEGATIVE
GLUCOSE, UR: NEGATIVE mg/dL (ref 0–75)
Ketone: NEGATIVE
Leukocyte Esterase: NEGATIVE
Nitrite: NEGATIVE
Ph: 7 (ref 4.5–8.0)
Protein: 30
RBC,UR: 2 /HPF (ref 0–5)
Specific Gravity: 1.018 (ref 1.003–1.030)
Squamous Epithelial: <1
WBC UR: NONE SEEN /HPF (ref 0–5)

## 2013-08-16 LAB — CBC WITH DIFFERENTIAL/PLATELET
Basophil #: 0.1 10*3/uL (ref 0.0–0.1)
Basophil %: 0.8 %
EOS ABS: 0.2 10*3/uL (ref 0.0–0.7)
Eosinophil %: 3.2 %
HCT: 38.1 % — ABNORMAL LOW (ref 40.0–52.0)
HGB: 13.1 g/dL (ref 13.0–18.0)
LYMPHS ABS: 1.9 10*3/uL (ref 1.0–3.6)
LYMPHS PCT: 25.9 %
MCH: 34.3 pg — ABNORMAL HIGH (ref 26.0–34.0)
MCHC: 34.5 g/dL (ref 32.0–36.0)
MCV: 100 fL (ref 80–100)
Monocyte #: 0.7 x10 3/mm (ref 0.2–1.0)
Monocyte %: 9.7 %
NEUTROS ABS: 4.4 10*3/uL (ref 1.4–6.5)
Neutrophil %: 60.4 %
PLATELETS: 106 10*3/uL — AB (ref 150–440)
RBC: 3.83 10*6/uL — ABNORMAL LOW (ref 4.40–5.90)
RDW: 14.9 % — AB (ref 11.5–14.5)
WBC: 7.2 10*3/uL (ref 3.8–10.6)

## 2013-08-16 LAB — AMMONIA: Ammonia, Plasma: 151 mcmol/L — ABNORMAL HIGH (ref 11–32)

## 2013-08-16 LAB — PROTIME-INR
INR: 1.3
PROTHROMBIN TIME: 15.7 s — AB (ref 11.5–14.7)

## 2013-08-16 LAB — TROPONIN I: TROPONIN-I: 0.1 ng/mL — AB

## 2013-08-16 LAB — APTT: Activated PTT: 34.3 secs (ref 23.6–35.9)

## 2013-08-17 LAB — DRUG SCREEN, URINE
Amphetamines, Ur Screen: NEGATIVE (ref ?–1000)
BARBITURATES, UR SCREEN: NEGATIVE (ref ?–200)
Benzodiazepine, Ur Scrn: POSITIVE (ref ?–200)
COCAINE METABOLITE, UR ~~LOC~~: NEGATIVE (ref ?–300)
Cannabinoid 50 Ng, Ur ~~LOC~~: POSITIVE (ref ?–50)
MDMA (ECSTASY) UR SCREEN: NEGATIVE (ref ?–500)
Methadone, Ur Screen: NEGATIVE (ref ?–300)
Opiate, Ur Screen: NEGATIVE (ref ?–300)
Phencyclidine (PCP) Ur S: NEGATIVE (ref ?–25)
TRICYCLIC, UR SCREEN: NEGATIVE (ref ?–1000)

## 2013-08-17 LAB — TROPONIN I
Troponin-I: 0.1 ng/mL — ABNORMAL HIGH
Troponin-I: 0.12 ng/mL — ABNORMAL HIGH

## 2013-08-17 LAB — CK-MB
CK-MB: 5.4 ng/mL — AB (ref 0.5–3.6)
CK-MB: 4.9 ng/mL — ABNORMAL HIGH (ref 0.5–3.6)
CK-MB: 3.6 ng/mL (ref 0.5–3.6)

## 2013-08-17 LAB — MAGNESIUM
MAGNESIUM: 1.3 mg/dL — AB
MAGNESIUM: 1.9 mg/dL

## 2013-08-17 LAB — ETHANOL
Ethanol %: <0.003 % (ref 0.000–0.080)
Ethanol: <3 mg/dL

## 2013-08-17 LAB — PHOSPHORUS: Phosphorus: 2.8 mg/dL (ref 2.5–4.9)

## 2013-08-18 LAB — AMMONIA: Ammonia, Plasma: 79 mcmol/L — ABNORMAL HIGH (ref 11–32)

## 2013-08-18 LAB — CBC WITH DIFFERENTIAL/PLATELET
BASOS PCT: 0.5 %
Basophil #: 0 10*3/uL (ref 0.0–0.1)
EOS ABS: 0.4 10*3/uL (ref 0.0–0.7)
Eosinophil %: 5.4 %
HCT: 36.1 % — AB (ref 40.0–52.0)
HGB: 12.7 g/dL — ABNORMAL LOW (ref 13.0–18.0)
LYMPHS ABS: 1.9 10*3/uL (ref 1.0–3.6)
LYMPHS PCT: 28.7 %
MCH: 35.1 pg — ABNORMAL HIGH (ref 26.0–34.0)
MCHC: 35 g/dL (ref 32.0–36.0)
MCV: 100 fL (ref 80–100)
MONO ABS: 0.9 x10 3/mm (ref 0.2–1.0)
Monocyte %: 13.5 %
Neutrophil #: 3.5 10*3/uL (ref 1.4–6.5)
Neutrophil %: 51.9 %
PLATELETS: 79 10*3/uL — AB (ref 150–440)
RBC: 3.61 10*6/uL — ABNORMAL LOW (ref 4.40–5.90)
RDW: 14.6 % — ABNORMAL HIGH (ref 11.5–14.5)
WBC: 6.8 10*3/uL (ref 3.8–10.6)

## 2013-08-18 LAB — MAGNESIUM: MAGNESIUM: 1.8 mg/dL

## 2013-08-18 LAB — BASIC METABOLIC PANEL
Anion Gap: 6 — ABNORMAL LOW (ref 7–16)
BUN: 6 mg/dL — ABNORMAL LOW (ref 7–18)
Calcium, Total: 7.6 mg/dL — ABNORMAL LOW (ref 8.5–10.1)
Chloride: 109 mmol/L — ABNORMAL HIGH (ref 98–107)
Co2: 24 mmol/L (ref 21–32)
Creatinine: 0.7 mg/dL (ref 0.60–1.30)
EGFR (African American): >60
EGFR (Non-African Amer.): >60
Glucose: 88 mg/dL (ref 65–99)
Osmolality: 275 (ref 275–301)
Potassium: 3.4 mmol/L — ABNORMAL LOW (ref 3.5–5.1)
Sodium: 139 mmol/L (ref 136–145)

## 2013-08-18 LAB — PHOSPHORUS: Phosphorus: 3.1 mg/dL (ref 2.5–4.9)

## 2013-08-19 LAB — PLATELET COUNT: Platelet: 85 10*3/uL — ABNORMAL LOW (ref 150–440)

## 2013-08-20 ENCOUNTER — Ambulatory Visit: Payer: Self-pay | Admitting: Neurology

## 2013-08-20 LAB — PLATELET COUNT: PLATELETS: 80 10*3/uL — AB (ref 150–440)

## 2013-09-21 ENCOUNTER — Ambulatory Visit: Payer: Self-pay | Admitting: Internal Medicine

## 2013-09-24 LAB — CBC CANCER CENTER
Bands: 3 %
Basophil: 1 %
Comment - H1-Com1: NORMAL
Eosinophil: 5 %
HCT: 40.6 % (ref 40.0–52.0)
HGB: 14.1 g/dL (ref 13.0–18.0)
LYMPHS PCT: 34 %
MCH: 33.5 pg (ref 26.0–34.0)
MCHC: 34.7 g/dL (ref 32.0–36.0)
MCV: 97 fL (ref 80–100)
Monocytes: 9 %
Platelet: 110 x10 3/mm — ABNORMAL LOW (ref 150–440)
RBC: 4.2 10*6/uL — ABNORMAL LOW (ref 4.40–5.90)
RDW: 13.7 % (ref 11.5–14.5)
Segmented Neutrophils: 45 %
Variant Lymphocyte: 3 %
WBC: 7.8 x10 3/mm (ref 3.8–10.6)

## 2013-09-24 LAB — PROTIME-INR
INR: 1.3
Prothrombin Time: 16.4 secs — ABNORMAL HIGH (ref 11.5–14.7)

## 2013-09-24 LAB — FIBRIN DEGRADATION PROD.(ARMC ONLY): Fibrin Degradation Prod.: <10 ug/ml (ref 2.1–7.7)

## 2013-09-24 LAB — APTT: ACTIVATED PTT: 35.8 s (ref 23.6–35.9)

## 2013-09-24 LAB — FOLATE: Folic Acid: 21.1 ng/mL (ref 3.1–100.0)

## 2013-09-24 LAB — LACTATE DEHYDROGENASE: LDH: 224 U/L (ref 85–241)

## 2013-09-24 LAB — FIBRINOGEN: Fibrinogen: 269 mg/dL (ref 210–470)

## 2013-09-24 LAB — IRON AND TIBC
IRON BIND. CAP.(TOTAL): 206 ug/dL — AB (ref 250–450)
Iron Saturation: 82 %
Iron: 169 ug/dL (ref 65–175)
UNBOUND IRON-BIND. CAP.: 37 ug/dL

## 2013-09-24 LAB — RETICULOCYTES
ABSOLUTE RETIC COUNT: 0.0694 10*6/uL (ref 0.019–0.186)
Reticulocyte: 1.65 % (ref 0.4–3.1)

## 2013-09-24 LAB — FERRITIN: FERRITIN (ARMC): 318 ng/mL (ref 8–388)

## 2013-09-29 LAB — URINE IEP, RANDOM

## 2013-10-02 LAB — PROT IMMUNOELECTROPHORES(ARMC)

## 2013-10-11 ENCOUNTER — Ambulatory Visit: Payer: Self-pay | Admitting: Internal Medicine

## 2013-11-09 ENCOUNTER — Inpatient Hospital Stay: Payer: Self-pay | Admitting: Internal Medicine

## 2013-11-09 LAB — URINALYSIS, COMPLETE
Bilirubin,UR: NEGATIVE
Glucose,UR: NEGATIVE mg/dL (ref 0–75)
Hyaline Cast: 3
KETONE: NEGATIVE
Leukocyte Esterase: NEGATIVE
Nitrite: NEGATIVE
PH: 5 (ref 4.5–8.0)
Protein: NEGATIVE
RBC,UR: <1 /HPF (ref 0–5)
Specific Gravity: 1.006 (ref 1.003–1.030)
Squamous Epithelial: NONE SEEN
WBC UR: 1 /HPF (ref 0–5)

## 2013-11-09 LAB — CBC
HCT: 40.4 % (ref 40.0–52.0)
HGB: 14.1 g/dL (ref 13.0–18.0)
MCH: 34.1 pg — ABNORMAL HIGH (ref 26.0–34.0)
MCHC: 34.9 g/dL (ref 32.0–36.0)
MCV: 98 fL (ref 80–100)
Platelet: 124 10*3/uL — ABNORMAL LOW (ref 150–440)
RBC: 4.14 10*6/uL — AB (ref 4.40–5.90)
RDW: 14 % (ref 11.5–14.5)
WBC: 8.4 10*3/uL (ref 3.8–10.6)

## 2013-11-09 LAB — COMPREHENSIVE METABOLIC PANEL
ALBUMIN: 3.4 g/dL (ref 3.4–5.0)
ANION GAP: 6 — AB (ref 7–16)
AST: 39 U/L — AB (ref 15–37)
Alkaline Phosphatase: 109 U/L
BUN: 5 mg/dL — AB (ref 7–18)
Bilirubin,Total: 1.7 mg/dL — ABNORMAL HIGH (ref 0.2–1.0)
CALCIUM: 8.2 mg/dL — AB (ref 8.5–10.1)
CHLORIDE: 107 mmol/L (ref 98–107)
CO2: 29 mmol/L (ref 21–32)
Creatinine: 0.72 mg/dL (ref 0.60–1.30)
EGFR (African American): >60
GLUCOSE: 118 mg/dL — AB (ref 65–99)
OSMOLALITY: 281 (ref 275–301)
Potassium: 4.1 mmol/L (ref 3.5–5.1)
SGPT (ALT): 40 U/L
Sodium: 142 mmol/L (ref 136–145)
Total Protein: 6.7 g/dL (ref 6.4–8.2)

## 2013-11-09 LAB — AMMONIA: AMMONIA, PLASMA: 69 umol/L — AB (ref 11–32)

## 2013-11-10 LAB — BASIC METABOLIC PANEL
Anion Gap: 8 (ref 7–16)
BUN: 7 mg/dL (ref 7–18)
CALCIUM: 7.8 mg/dL — AB (ref 8.5–10.1)
CHLORIDE: 108 mmol/L — AB (ref 98–107)
CREATININE: 0.86 mg/dL (ref 0.60–1.30)
Co2: 25 mmol/L (ref 21–32)
EGFR (African American): >60
Glucose: 123 mg/dL — ABNORMAL HIGH (ref 65–99)
Osmolality: 281 (ref 275–301)
POTASSIUM: 3.2 mmol/L — AB (ref 3.5–5.1)
SODIUM: 141 mmol/L (ref 136–145)

## 2013-11-10 LAB — MAGNESIUM: MAGNESIUM: 1.2 mg/dL — AB

## 2013-11-10 LAB — CBC WITH DIFFERENTIAL/PLATELET
BASOS PCT: 0.7 %
Basophil #: 0.1 10*3/uL (ref 0.0–0.1)
Eosinophil #: 0.1 10*3/uL (ref 0.0–0.7)
Eosinophil %: 0.8 %
HCT: 36.3 % — AB (ref 40.0–52.0)
HGB: 12.6 g/dL — ABNORMAL LOW (ref 13.0–18.0)
Lymphocyte #: 2.6 10*3/uL (ref 1.0–3.6)
Lymphocyte %: 25.5 %
MCH: 33.4 pg (ref 26.0–34.0)
MCHC: 34.7 g/dL (ref 32.0–36.0)
MCV: 96 fL (ref 80–100)
Monocyte #: 1.2 x10 3/mm — ABNORMAL HIGH (ref 0.2–1.0)
Monocyte %: 11.4 %
Neutrophil #: 6.3 10*3/uL (ref 1.4–6.5)
Neutrophil %: 61.6 %
Platelet: 108 10*3/uL — ABNORMAL LOW (ref 150–440)
RBC: 3.77 10*6/uL — ABNORMAL LOW (ref 4.40–5.90)
RDW: 14.1 % (ref 11.5–14.5)
WBC: 10.2 10*3/uL (ref 3.8–10.6)

## 2013-11-10 LAB — AMMONIA: Ammonia, Plasma: 43 mcmol/L — ABNORMAL HIGH (ref 11–32)

## 2013-11-15 LAB — CULTURE, BLOOD (SINGLE)

## 2014-03-20 ENCOUNTER — Ambulatory Visit: Payer: Self-pay | Admitting: Podiatry

## 2014-05-03 NOTE — Discharge Summary (Signed)
PATIENT NAME:  Adrian Hale, Adrian Hale MR#:  [id number] DATE OF BIRTH:  07/08/1960  DATE OF ADMISSION:  10/21/2012 DATE OF DISCHARGE:  10/24/2012  ADMITTING DIAGNOSIS:  Left lower extremity swelling and redness.   DISCHARGE DIAGNOSES: 1.  Left lower extremity swelling and redness due to cellulitis, now improved.  2.  Bilateral headaches, possible tension headaches. Previous history of migraines but appears atypical for migraines. CT scan of the head was negative.  3.  End-stage liver disease with liver cirrhosis.  4.  Hypertension.  5.  History of alcohol abuse, none now.  6.  Mild anemia, likely due to anemia of chronic disease.  7.  Status post failed TIPS procedure.  8.  History of recurrent admissions for alcoholic gastritis and intoxication in the past.  9.  Tobacco abuse. The patient was counseled regarding smoking cessation.  10.  History of right breast lump status post partial resection.   CONSULTANTS: None.   PERTINENT LABS AND EVALUATIONS: Admitting glucose 182, BUN 10, creatinine 0.78, sodium 139, potassium 3.6, chloride was 108, CO2 is 28, calcium 281. LFTs showed a total protein of 5.6, albumin at 2.8, bilirubin total 1.6, AST is 36. WBC 6.0, hemoglobin 11.8, platelet count was 67, on discharge it was 94. INR 1.3. Blood cultures x 2 no growth. CT scan of the head showed no acute abnormality.  Ultrasound of the left lower extremity showed no DVT.   HOSPITAL COURSE: Please refer to H and P done by the admitting physician. The patient is a 54 year old white male with history of liver cirrhosis, hepatic encephalopathy, status post failed TIPS procedure in the past, hypertension, who presented with left lower extremity swelling and erythema and pain. The patient was diagnosed with acute cellulitis. He was started on IV antibiotics. He had a Doppler of his lower extremity that was negative. The patient's swelling and erythema improved slowly as the days progressed. Now his leg is doing much  better. Pain is significantly reduced. Currently, he is stable for discharge to home.   DISCHARGE MEDICATIONS:  Nadolol 20 daily, Xifaxan 550 one tab p.o. b.i.d., Nexium 40 mg 1 tab p.o. daily, folic acid 1 tab p.o. daily, Cialis 5 mg daily as needed, calcium plus vitamin D 600 one tab p.o. daily, lactulose 30 mL b.i.d., Lasix 40 daily, melatonin 10 mg at bedtime, milk thistle capsules 1 capsule daily, potassium gluconate 1 tab p.o. daily, trazodone 100 at bedtime, sertraline 25 daily, Proventil 2 puffs 4 times a day as needed, Lidocaine 5% topical ointment to apply as needed, amoxicillin and clavulanic acid 1 tab p.o. q. 12, Cleocin 300 q. 6, antibiotics to be continued for 7 days, probiotic while on antibiotics.   DIET: Low-sodium, low-fat, low-cholesterol.   ACTIVITY: As tolerated.   FOLLOW-UP: With primary M.D. in 1 to 2 weeks. Follow-up with [facility] neurology in 1 to 2 weeks for headaches.   TIME SPENT: 35 minutes spent on the discharge.     ____________________________ Lafonda Mosses. Posey Pronto, MD shp:dp D: 10/25/2012 08:27:02 ET T: 10/25/2012 08:56:45 ET JOB#: [id number]  cc: Leylany Nored H. Posey Pronto, MD, <Dictator> Alric Seton MD ELECTRONICALLY SIGNED 11/04/2012 10:23

## 2014-05-03 NOTE — Consult Note (Signed)
PATIENT NAME:  Adrian Hale, Adrian Hale MR#:  [id number] DATE OF BIRTH:  1960/07/30  DATE OF CONSULTATION:  09/01/2012  CONSULTING PHYSICIAN:  Gonzella Lex, MD  IDENTIFYING INFORMATION: A 54 year old man who was brought into the Emergency Room last night by law enforcement after being found intoxicated in his automobile.   CHIEF COMPLAINT: "I started drinking."  REASON FOR CONSULT:  For  evaluation for appropriate disposition.   HISTORY OF PRESENT ILLNESS: The patient tells me that yesterday he got in a fight with his wife. Actually it was probably the day before yesterday. In any case, they got into some kind of an argument. He says that he then went out for a drive and decided impulsively he was not going to go home. Instead, he pulled over to the side of the road and drank a pint of liquor and passed out. After giving a little more history, he clarified that there was a little bit more to it. He has actually relapsed into drinking for the last couple of days and that was what the fight with his wife was about. Apparently he got agitated and damaged some property at home but was not assaultive or threatening to her. The patient was then picked up by law enforcement who found him intoxicated. By the time I interviewed him, the patient had sobered up. He tells me that up until the last couple of days, his mood has been good. He had been sleeping well. He has not had any kind of suicidal or homicidal ideation and felt like things were going well. He cannot really pinpoint why he relapsed, but he did. He had been sober for about nine months prior to that and had been going to [employer] regularly. It sounds like since he just got married about a month ago, maybe there has been some extra stress on him related to that. When he was intoxicated at least one document says that he had made suicidal statements, but he totally denies any suicidal ideation to me and his wife does not report any concern that he is  dangerous to himself or others. He is not taking any other psychiatric medicine that he can recall right now. He is just getting primary care treatment. Denies that he is using any other drugs.   PAST PSYCHIATRIC HISTORY: Long history of alcohol dependence but says that he has been able to maintain extended periods of sobriety. He is very familiar with 12 step programs and has a sponsor in place. He was at [facility] about a year ago, which is where met his wife. He has had detox admissions here at our hospital, in the past. He denies that he has been in psychiatric hospitals for anything except detox. He says he has taken Zoloft in the past, but he cannot really explain what for. Denies any history of suicide attempts. Denies ever being violent in the past.   SOCIAL HISTORY: The patient used to work as an Chief Financial Officer, but has not worked in the last several years. He is applying for disability, possibly related to his liver disease. His wife is also on disability. They live by themselves. No one else in the home.   PAST MEDICAL HISTORY: The patient has liver disease, possibly cirrhosis related to his history of alcohol abuse. He says he is supposed to be taking lactulose, but he has not been able to afford it recently and so he has been somewhat noncompliant with his medicine. He could not remember his other  medicine but we have a more full list here, most of which are supplements, but also says Zoloft 25 mg a day, trazodone 100 mg at night, Lasix, Nadolol, Xifaxan and Nexium.   ALLERGIES: No known drug allergies.   FAMILY HISTORY: Denies any family history.   SUBSTANCE ABUSE HISTORY: As noted above, long history of alcohol dependence, however, he says he has never had an alcohol withdrawal seizure and denies ever having had delirium tremens. Denies abusing any other drugs.   REVIEW OF SYSTEMS: Feeling guilty and sorry for himself about sorry what he did. A little bit fatigued. Not feeling tremulous, however,  not nauseous.  He has been able to eat fine. He is not dizzy. No sign of delirium. No other acute physical symptoms.   MENTAL STATUS EXAMINATION: The patient is a somewhat disheveled gentleman, who is alert and oriented and cooperative with the interview. Eye contact good. Psychomotor activity normal. Speech normal rate, tone and volume. Affect is a little bit anxious and sad, but reactive. Mood stated as being okay. Thoughts are lucid. No sign of loosening of associations or delusions. Denies hallucinations. Denies any suicidal or homicidal ideation. Able to state multiple positive things in his life to live for. Alert and oriented x4. Normal intelligence reasonable judgment and insight.   LABORATORY RESULTS: Drug screen is all negative. Urinalysis unremarkable. Chest x-ray unremarkable. TSH low at 0.32, but free thyroxine normal. When he came in last night his alcohol level was 362. He is bilirubin is elevated at 2.9, AST elevated at 51, albumin low at 3.2. All potentially consistent with cirrhosis. Platelet count low at 116, hematocrit low at 38.9.   ASSESSMENT: A 54 year old man with alcohol dependence recent relapse, but currently no sign of seizure or oncoming delirium. He is sorry for what he did and has an appropriate plan and understanding of the importance of sobriety. He is not acting in a dangerous manner currently. We spoke to his wife, who thinks that the best thing would be for him to come home. She is not afraid that he is going to be a danger to her or himself. She is familiar with his substance abuse problem. She intends to drive him to his AA meetings and make sure that he is not drinking. The patient has been educated about the dangers of substance abuse and the importance of sobriety and agrees to continue following up. No change to medicine. I have reviewed the case with the Emergency Room doctor who agrees and I have taken him off of involuntary commitment papers.   DIAGNOSIS,  PRINCIPAL AND PRIMARY:  AXIS I: Alcohol dependence.   SECONDARY DIAGNOSES: AXIS I: Substance-induced mood disorder.  AXIS II: No diagnosis.  AXIS III: Cirrhosis.  AXIS IV: Moderate to severe from this conflict with his wife and his chronic illness and lack of a resources.  AXIS V: Functioning at time of evaluation is 35.     ____________________________ Gonzella Lex, MD jtc:dp D: 09/01/2012 13:04:41 ET T: 09/01/2012 13:22:06 ET JOB#: [id number]  cc: Gonzella Lex, MD, <Dictator> Gonzella Lex MD ELECTRONICALLY SIGNED 09/04/2012 9:25

## 2014-05-03 NOTE — Consult Note (Signed)
Brief Consult Note: Diagnosis: alcohol dependence.   Patient was seen by consultant.   Recommend further assessment or treatment.   Comments: Psychiatry: PAtient seen and evaluated. He wants to go home but I am not inclined to discharge him without talking to his wife. Called her and had to leave voice mail requesting call back. Pt under IVC but sobered up and not delirious. Will table decision while waiting to get in touch with wife.  Electronic Signatures: Gonzella Lex (MD)  (Signed 22-Aug-14 12:16)  Authored: Brief Consult Note   Last Updated: 22-Aug-14 12:16 by Gonzella Lex (MD)

## 2014-05-03 NOTE — H&P (Signed)
PATIENT NAME:  Adrian Hale, Adrian Hale MR#:  [id number] DATE OF BIRTH:  [birth year]  DATE OF ADMISSION:  10/21/2012  PRIMARY CARE PHYSICIAN: Dr. Mariana Arn.  REFERRING PHYSICIAN: Dr. Joni Fears.   CHIEF COMPLAINT: Left lower extremity swelling and redness.   HISTORY OF PRESENT ILLNESS: The patient is a 54 year old Caucasian male with past medical history of cirrhosis, hepatic encephalopathy, status post failed TIPS procedure in the past, hypertension and history of alcohol intoxication, who is presenting to the ER with the chief complaint of left lower extremity swelling and redness. The patient is reporting that until Thursday, his left lower extremity was intact and  he could not recall the any traumas or any injuries. Thursday evening, he started developing pain in his left lower extremity and subsequently he developed redness. Progressively, it is getting more and more swollen and the patient is having difficulty with ambulation. Denies any fever, but complaining of chills. The patient was evaluated by dermatologist at [facility] on Thursday afternoon regarding his left great toe callus and he has suggested the patient to follow up with podiatry as an outpatient. In the ER, the patient was given morphine for pain, and also left lower extremity venous Dopplers were done to rule out DVT. Eventually, hospitalist team was called to admit the patient for left lower extremity cellulitis after ruling out DVT. The patient denies any chest pain or shortness of breath. No other complaints. No similar complaints in the past.   PAST MEDICAL HISTORY:  1.  Liver cirrhosis with history of hepatic encephalopathy, status post failed TIPS procedure. 2.  Recurrent admissions for alcoholic gastritis and intoxication. 3.  Hypertension. 4.  Tobacco abuse. 5.  Prior history of behavior health admission for alcohol detox.   PAST SURGICAL HISTORY:  1.  History of TIPS procedure, which apparently failed. 2.  History of right  breast lump resection. Records unavailable.  ALLERGIES: No known drug allergies.   PSYCHOSOCIAL HISTORY: Lives at home with wife. Still smokes 1/2 pack a day. Still drinking alcohol and his last drink was on August 22. Denies any illicit drug usage.   FAMILY HISTORY: Mother and sisters have history of hypertension.   HOME MEDICATIONS: Xifaxan 550 mg 1 tablet 2 times a day, trazodone 100 mg once daily,  Proventil 2 puff inhalations q.6 hours as needed, Nexium 40 mg once daily, nadolol 20 mg once daily, milk thistle 1 capsule p.o. once daily, melatonin 5 mg 2 tablets once daily, lactulose 30 mL p.o. 2 times a day, furosemide 20 mg once daily, folic acid 0.8 mg once a day, Cialis 5 mg once daily, calcium 1 tablet p.o. once daily.  REVIEW OF SYSTEMS:   CONSTITUTIONAL: Denies any fever. Complaining of chills. Denies any fatigue.  EYES: Denies blurry vision or glaucoma.  ENT: Denies epistaxis or discharge.  RESPIRATORY:  Denies cough or COPD.  CARDIOVASCULAR: No chest pain or palpitations.  GASTROINTESTINAL: Denies nausea, vomiting, diarrhea.  GENITOURINARY: No dysuria or hematuria.  ENDOCRINE: Denies any polyuria, nocturia or thyroid problems.  HEMATOLOGIC AND LYMPHATIC: Denies any anemia or easy bruising.  INTEGUMENTARY: Denies acne, rash, lesions, except for left lower extremity swelling and redness.  MUSCULOSKELETAL: Complaining of left lower extremity pain. Denies any gout.  NEUROLOGIC: Denies vertigo, ataxia, dementia.  PSYCHIATRIC: Denies any ADD or OCD.   PHYSICAL EXAMINATION: VITAL SIGNS: Temperature 98.5, pulse 94, respirations 18, blood pressure 145/67, pulse oximetry 96%.  GENERAL APPEARANCE: Not in acute distress. Moderately built and nourished.  HEENT: Normocephalic, atraumatic. Pupils are equally  reacting to light and accommodation. No scleral icterus. No sinus tenderness. No postnasal drip. Moist mucous membranes.  NECK: Supple. No JVD. No thyromegaly. No lymphadenopathy.   LUNGS: Clear to auscultation bilaterally. No accessory muscle usage. No anterior chest wall tenderness on palpation.  CARDIAC: S1, S2 normal. Regular rate and rhythm. No murmurs.  GASTROINTESTINAL: Soft, obese. Bowel sounds are positive in all 4 quadrants. Nontender. Positive hepatosplenomegaly. No rebound tenderness.  NEUROLOGIC: Awake, alert, oriented x 3. Motor and sensory grossly intact. Following verbal commands.  EXTREMITIES: Left lower extremity is edematous, tender, erythematous. No discharge is noticed.  LABORATORY AND IMAGING STUDIES: Glucose 182, BUN 10, creatinine 0.78, sodium 139, potassium 3.6, chloride 108, CO2 of 28, GFR greater than 60. Anion gap is 3. Serum osmolality 281. Calcium 8.1. LFTs: Total protein is 5.6, albumin 2.8; bilirubin total is 1.6, AST 38, ALT 28, alkaline phosphatase 91. WBC 6.0, hemoglobin 11.8, hematocrit 32.9. PT 16.6. INR is 1.3. Activated PTT is 38.5. Left lower extremity venous Dopplers: No DVT is present.   ASSESSMENT AND PLAN: A 54 year old Caucasian male brought into the Emergency Room for left lower extremity swelling and redness associated with pain, will be admitted with the following assessment and plan:  1.  Left lower extremity cellulitis: Ruled out deep vein thrombosis. Will admit him to med/surg floor and will give him vancomycin IV.  2.  End-stage liver disease/liver cirrhosis: Will resume his home medications including Xifaxan.  3.  Hypertension: Continue home medications.  4.  History of alcohol and tobacco abuse.  5.  He is full code. Wife is the medical power of attorney.   Diagnosis and plan of care was in detail with the patient. He is aware of the plan.   ____________________________ Nicholes Mango, MD ag:jm D: 10/21/2012 13:42:14 ET T: 10/21/2012 17:13:16 ET JOB#: [id number]  cc: Nicholes Mango, MD, <Dictator> Dr. Erick Blinks MD ELECTRONICALLY SIGNED 10/22/2012 16:16

## 2014-05-04 NOTE — H&P (Signed)
PATIENT NAME:  Adrian Hale, Adrian Hale MR#:  [id number] DATE OF BIRTH:  Dec 26, 1960  DATE OF ADMISSION:  08/16/2013  REFERRING PHYSICIAN: Dr. Delman Kitten.   PRIMARY CARE PHYSICIAN:  [facility].   GASTROINTESTINAL: [facility] as well.  CHIEF COMPLAINT: Altered mental status, confusion, aphasia.   HISTORY OF PRESENT ILLNESS: This is a 54 year old male with known history of cirrhosis, hepatic encephalopathy, status post failed TIPS procedure in the past, hypertension, history of alcohol abuse intoxication in the past. The patient quit drinking more than 1 year ago, the patient was brought by his wife today due to complaints of confusion, altered mental status and aphasia. The wife reports the patient slept at 3:00 p.m., woke up at 5:00. He sounded more confused to her commands. This kept progressing through the day. As well, he had some generalized weakness. As well, she reports she noticed some aphasia and questionable weakness in the right side, but when the patient presented to the ED, he was noticed only to have aphasia, but his neurologic exam was nonfocal secondary to his altered mental status. The patient during CT of the head he did have generalized tonic seizures, witnessed by the staff. The patient required intubation. After that, CT head did not show any acute findings. The patient was loaded with Keppra, his ammonia level was found to be significantly high at 151, wife thought he was taking his lactulose, but at the same time she does not follow. In the past he has history of noncompliance of  not taking his lactulose as well. She denies him having any fever, any chills, any cough, any productive sputum any chest pain as well. The patient was found to have mildly elevated troponin level at 0.1. Reviewing his old labs it appears to be chronically elevated. He has no acute changes in his EKG.   PAST MEDICAL HISTORY:  1. Liver cirrhosis with hepatic encephalopathy status post failed TIPS procedure.   2. Recurrent admissions for alcoholic gastritis intoxication in the past, but he quit alcohol 1 year.  3. Hypertension.  4. Tobacco abuse.  5. History of admissions for alcohol detox in the past. 6. History of hepatic encephalopathy.  7. Hepatitis.  8. Cataracts.  9. History of compliance issues with his medication.   SOCIAL HISTORY: Lives at home with his wife. Quit drinking 1 year ago. Still smokes.   ALLERGIES: No known drug allergies.   FAMILY HISTORY: Significant for hypertension.   HOME MEDICATIONS: Wife does not have a list of his home medication. She does not know what he is taking. She only knows he is on lactulose, but she does not know the rest of his medications.   REVIEW OF SYSTEMS: Unable to obtain secondary to patient's altered mental status and sedation.   PHYSICAL EXAMINATION:  VITAL SIGNS: Pulse 76, respiratory rate 20, blood pressure 126/60, saturating 100% on oxygen, assist control mode 550 tidal volume of, PEEP of 5, respiratory rate 16.  GENERAL: Well-nourished male who looks comfortable in bed, sedated, intubated.  HEENT: Head atraumatic, normocephalic. Pupils are equal and reactive to light. Pink conjunctivae. Anicteric sclerae. Intubated with oral gastric tube. No nasal discharge or bleed noted.  NECK: Supple. No thyromegaly. No JVD. No carotid bruits, no lymphoadenopathy. CHEST: Good air entry bilaterally. No wheezing, rales, rhonchi.  CARDIOVASCULAR: S1, S2 heard. No rubs, murmur or gallops.  ABDOMEN: Obese, soft, nontender, nondistended. Bowel sounds present. No fluid wave or shifting, dullness noticed.  EXTREMITIES: No edema. No clubbing. No cyanosis. Pedal pulses +  2 bilaterally. The patient had a small ulceration at the left great toe.  PSYCHIATRIC: Unable to evaluate as the patient is sedated and unable to evaluate as the patient is intubated.  MUSCULOSKELETAL: No joint effusion or erythema.  SKIN: Normal skin turgor. Warm and dry.   PERTINENT  LABORATORY DATA: Glucose 110, BUN 7, creatinine 0.69, sodium 140, potassium 4.2, chloride 109, CO2 24, ammonia 151, total protein 6.2, total bilirubin 1.8, albumin 2.9, alkaline phosphatase 109, AST 42. ALT 39. Troponin 0.10. White blood cells 7.2, hemoglobin 13.1, hematocrit 38.1, platelets 106,000. INR 1.3. Urinalysis negative for leukocyte esterase and nitrite.   IMAGING STUDIES: CT head without contrast showing no acute intracranial finding or intracranial atherosclerosis.   ASSESSMENT AND PLAN:  1. Acute encephalopathy. The patient presents with altered mental status with initially thought to be due to CVA as he has had some aphasia and questionable weakness, so he was started on aspirin per rectum. As well, he had questionable seizure activity while in the CT scan, so he was started on IV Keppra as ED physician discussed with oncology on call, Dr. Tamala Julian. As well, he had elevated ammonia level most likely due to noncompliance with lactulose, so his acute encephalopathy can be multifactorial. So we will continue him on aspirin. We will continue with Keppra, with await neurology evaluation to see if they want to do EEG while the patient is on sedation. As well as consulting neurology service we will continue with lactulose and (Dictation Anomaly) <MISSING TEXT> . We will monitor ammonia levels closely.  2. Acute respiratory failure. The patient was intubated for airway protection secondary to his encephalopathy. We will continue with full ventilatory support. ABG was noted, we will consult pulmonary service for ventilator management. We will continue the patient on sedation. We will repeat ABG in a.m.  3. Elevated troponins. Chronically elevated at baseline. Will monitor closely. No EKG changes. Will cycle cardiac enzymes.  4. Tobacco abuse.   5. History of hepatic encephalopathy. We will continue with Zithromax and lactulose.  6. Deep vein thrombosis prophylaxis. Subcutaneous heparin.   CODE STATUS:  Discussed with wife. The patient is full code.   TOTAL CRITICAL CARE TIME SPENT ON ADMISSION AND PATIENT CARE: 60 minutes.     ____________________________ Albertine Patricia, MD dse:jh D: 08/17/2013 00:56:58 ET T: 08/17/2013 01:28:59 ET JOB#: [id number]  cc: Albertine Patricia, MD, <Dictator> Yoshito Gaza Graciela Husbands MD ELECTRONICALLY SIGNED 08/17/2013 7:35

## 2014-05-04 NOTE — H&P (Signed)
PATIENT NAME:  Adrian Hale, Adrian Hale MR#:  [id number] DATE OF BIRTH:  [birth year]  DATE OF ADMISSION:  11/09/2013  PRIMARY CARE PROVIDER: [facility].  EMERGENCY DEPARTMENT REFERRING PHYSICIAN: Dr. Joni Fears.   CHIEF COMPLAINT: Confusion.  HISTORY OF PRESENT ILLNESS: The patient is a 54 year old white male with history of liver cirrhosis, hepatic encephalopathy, status post failed TIPS procedure in the past, hypertension, history of alcohol abuse, who is brought to the hospital acting confused. The patient according to his wife is not making any sense. Here in the ED, the patient is confused, not able to give any history. According to her he does take his medications by himself and normally has 3-4 bowel movements with the lactulose. The patient's ammonia during last hospitalization was in the 151 range. Today was 35. The patient at this point is confused and unable to give me any other history.   PAST MEDICAL HISTORY: Significant for:  1. Liver cirrhosis with hepatic encephalopathy, status post failed TIPS procedure. 2. Recurrent admissions for alcoholic gastritis in the past. 3. History of alcohol abuse, quit about a year ago.  4. Hypertension.  5. Tobacco abuse.  6. History of hepatic encephalopathy.  7. History of hepatitis.  8. Cataracts.  9. COPD. 10. History of noncompliance with medications in the past.   SOCIAL HISTORY: Lives at home with his wife. Quit drinking about more than a year ago. Still smoking.   ALLERGIES: None.   FAMILY HISTORY: Significant for hypertension.   MEDICATIONS: He is on vitamin E 1000 international units daily, vitamin B12 at 1000 mcg daily, Ventolin 2 puffs q.4 p.r.n., trazodone 100 mg 1-1/2 to 2 tabs at bedtime, spironolactone 400 mg at bedtime, propranolol 20 daily, once a day vitamin daily, Nexium 40 daily, Nasonex 50 mcg 2 sprays to each nostril daily, milk thistle oral tablets 1 tab p.o. daily, melatonin 10 mg at bedtime, lactulose 30 mL 1-2 times a  day, Lasix 40 mg 1 tab p.o. b.i.d., folic acid 0.8 one tab p.o. daily, iron sulfate 325 p.o. daily, chondroitin and glucosamine 1 tab p.o. daily, Cal-Citrate with vitamin D 1 tab p.o. daily,   PAST MEDICAL HISTORY: History of sleep apnea does not wear a CPAP at nighttime.   REVIEW OF SYSTEMS: Unobtainable due to the patient being confused.   PHYSICAL EXAMINATION:  VITAL SIGNS: Temperature 98.9, pulse 55, respirations 18, blood pressure 143/75, O2 of 96%. GENERAL: The patient is an obese white male in no acute distress.  HEENT: Head atraumatic, normocephalic. Pupils equally round, reactive to light and accommodation. There is no conjunctival pallor. No scleral icterus. Nasal exam shows no drainage or ulceration. External ear exam shows no erythema or drainage.  NECK: Supple without any thyromegaly. No JVD. No carotid bruits. No lymphadenopathy.  LUNGS: Clear to auscultation bilaterally without any rales, rhonchi, wheezing.  CARDIOVASCULAR: S1, S2 positive. No murmurs, rubs, clicks, or gallops.  ABDOMEN: Obese, soft, nontender, nondistended. Positive bowel sounds x 4, no fluid  of shifting, dullness noticed.  EXTREMITIES: No clubbing, cyanosis, edema, no cyanosis, 2+ pulses.  PSYCHIATRIC: The patient currently not anxious, but not oriented to place, person, or time.  NEUROLOGICAL: Cranial nerves II through XII grossly appear intact. Moving all extremities.  MUSCULOSKELETAL: There is no erythema or swelling.  SKIN: No rash.   LABORATORY DATA: Glucose 118, BUN 5, creatinine 0.772, sodium 142, potassium 4.1, chloride 107, CO2 is 29, calcium 8.2, ammonia level is 69. LFTs: Total protein 6.7, albumin 3.4, bilirubin total 1.7, alkaline phosphatase 109,  AST 39, ALT 40. WBC 8.4, hemoglobin 14.1, platelet count 124,000. Urinalysis: Nitrites negative. Leukocytes negative.   CT scan of the head without contrast showed no acute abnormality.   ASSESSMENT AND PLAN: The patient is a 54 year old white male  with history of liver cirrhosis with confusion.  1. Acute encephalopathy, likely due to hepatic encephalopathy. At this time, we will increase his lactulose to t.i.d., start him on Xifaxan. Monitor his mental status.  2. Liver cirrhosis. Continue spironolactone and Lasix.  3. Gastroesophageal reflux disease. We will continue his proton pump inhibitors.  4. Hypertension. We will continue propranolol.  5. Chronic obstructive pulmonary disease. We will continue p.r.n. albuterol.  6. Miscellaneous. He will be on sequential compression devices for deep vein thrombosis.  TIME SPENT: 55 minutes on this patient.    ____________________________ Lafonda Mosses. Posey Pronto, MD shp:lt D: 11/09/2013 17:36:39 ET T: 11/09/2013 18:36:38 ET JOB#: [id number]  cc: Jahson Emanuele H. Posey Pronto, MD, <Dictator> Alric Seton MD ELECTRONICALLY SIGNED 11/28/2013 19:23

## 2014-05-04 NOTE — Consult Note (Signed)
PATIENT NAME:  Adrian Hale, Adrian Hale MR#:  [id number] DATE OF BIRTH:  1960-07-22  DATE OF CONSULTATION:  04/06/2013  CONSULTING PHYSICIAN:  Robert Sunga A. Posey Pronto, MD  PRIMARY CARE PHYSICIAN: Dr. Hortencia Pilar at [facility].   CHIEF COMPLAINT: Confusion and slurred speech.   HISTORY OF PRESENT ILLNESS: [name] is a 54 year old Caucasian gentleman with past medical history of chronic alcoholism with history of hepatic encephalopathy, cirrhosis, COPD, sleep apnea, history of hepatitis and ongoing tobacco abuse, comes to the Emergency Room accompanied by the wife. The patient is supposed to take lactulose on a daily basis; however, he quit taking the last 3 to 4 days, and the patient's wife was at home after breakfast this morning, the patient went to bed and took a nap, woke up with feeling somewhat confused and wife  noticed that the patient was trying to find words, but was having some slurred speech. The wife felt the patient was a little confused, gave him a dose of lactulose. The patient did not have any further symptoms. He did feel weak overall; however, did not have any focal weakness. Currently, in the Emergency Room, the patient is feeling back to baseline. He tells me he is feeling better and wants to consider going home. On evaluation in Emergency Room, CT scan of the head was negative. The patient is hemodynamically stable. There is no focal neuro deficit suggestive of CVA.   PAST MEDICAL HISTORY: 1.  Sleep apnea.  2.  Chronic obstructive pulmonary disease.  3.  Cirrhosis of liver secondary to alcohol abuse.  4.  Chronic alcoholism, quit 7 months ago.  5.  History of hepatic encephalopathy. The patient has [facility] where he follows. He is on chronic Xifaxan and lactulose.  6.  Hepatic encephalopathy.  7.  History of hepatitis.  8.  Cataracts.  9.  History of compliance issues with his medications, per daughter's report.   SOCIAL HISTORY: Lives at home with wife. Quit drinking 7 months  ago. Smokes around a pack a day. Denies any other drug use.   MEDICATIONS:  Needs to be reconciled by pharmacy tech at this time.   ALLERGIES: No known drug allergies.   FAMILY HISTORY: Mother and sister have history of hypertension   REVIEW OF SYSTEMS:   CONSTITUTIONAL:  No fever, fatigue, weakness. EYES:  No blurred or double vision, glaucoma. ENT: No tinnitus, ear pain, hearing loss or difficulty swallowing.  RESPIRATORY: No cough, wheeze, hemoptysis or COPD.  CARDIOVASCULAR: No chest pain, orthopnea, edema or hypertension.  GASTROINTESTINAL: No nausea, vomiting, diarrhea, abdominal pain, hematemesis or GERD.  GENITOURINARY: No dysuria, hematuria or frequency.  ENDOCRINE: No polyuria, nocturia or thyroid problems.  HEMATOLOGY: No anemia, easy bruising or bleeding.  MUSCULOSKELETAL: No arthritis, cramps, gout or swelling.  NEUROLOGIC: Positive for some confusion earlier, which has cleared up. No dysarthria noted. No numbness or vertigo.  PSYCHIATRIC: No anxiety, depression or bipolar disorder. All other systems reviewed and negative.   PHYSICAL EXAMINATION: GENERAL: The patient is awake, alert, oriented x 3, not in acute distress.  VITAL SIGNS: Afebrile. Pulse 70. Blood pressure is 166/70. Pulse ox is 96% on room air.  HEENT: Atraumatic, normocephalic. Pupils: PERRLA. EOM intact. Oral mucosa is moist.  NECK: Supple. No JVD. No carotid bruit.  LUNGS: Clear to auscultation bilaterally. No rales, rhonchi, respiratory distress or labored breathing.  CARDIOVASCULAR: Both heart sounds are normal. Rate, rhythm regular. PMI not lateralized. Chest nontender.  EXTREMITIES: Good pedal pulses, good femoral pulses. Trace  lower extremity edema.  ABDOMEN: Obese, soft, nontender. No organomegaly. Positive bowel sounds. Cannot appreciate any fluid thrill.  NEUROLOGIC: The patient is right-handed. Speech clear. No dysarthria. Cranial nerves III through XII appear grossly intact. No facial droop. No  slurred speech. Motor system: 4+/5 in all  4 extremities. Reflexes 1+ in both upper and lower extremities. Plantars are downgoing.    Essentially, neuro exam appears grossly nonfocal.  SKIN: Warm and dry.   LABORATORY DATA: UA negative for UTI. Urine drug screen negative.   CT of the head shows chronic ethmoid and sphenoid sinusitis. No significant intracranial abnormality. 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 CBC within normal limits and platelet count 122. Glucose is 103. BUN is 5. Creatinine is 0.72. Troponin is 0.1. Ammonia is 86. Serum ethanol is less than 0.003.   EKG shows normal sinus rhythm.   ASSESSMENT: A 54 year old [name] with history of chronic cirrhosis of liver with chronic hepatitis and history of hepatic encephalopathy, along with history of sleep apnea and chronic obstructive pulmonary disease, comes into the Emergency Room with:  1.  Confusion with mild dysarthria, likely suspected due to mild hepatic encephalopathy. The patient's ammonia level stays anywhere from 70s to 100; however, the patient has stopped taking his lactulose for the last several days. The patient's wife resumed lactulose today. Currently, the patient is back to his baseline without any symptoms of slurring or any weakness. Neuro exam appears intact without any neuro deficit. It appears to be more likely due to his mild hepatic encephalopathy than TIA. The patient will receive 1 dose of aspirin, enteric-coated, in the Emergency Room. CT of the head was negative. The patient was again advised on continuing his  lactulose as directed, along with his Xifaxan, which is prescribed by primary GI physicians for his chronic alcoholic cirrhosis and history of chronic hepatic encephalopathy.  2.  History of chronic alcoholic cirrhosis of  liver. We will resume home meds, which is Lasix, nadolol, spironolactone, Xifaxan and lactulose.  3.  Gastroesophageal reflux disease. Continue  Nexium.     4.  Chronic obstructive pulmonary disease. The patient appears to be stable, as far as his  sats.  The patient has history of tobacco abuse. He is recommended on smoking cessation, about 3 minutes spent counseling.   The patient requested he be discharged to go home, since he is feeling back to baseline. He is worried about his social and financial situation at home. Wife feels comfortable  taking the patient home. I did advise the patient to come back to the Emergency Room if his symptoms worsen. At present, he appears at baseline to be discharged to go home. The patient's daughter, who was present in the Emergency Room, did mention about the patient's noncompliance with lactulose. I did again advise the patient to keep his routine of his lactulose and other meds for his cirrhosis of liver. The patient will be discharged back to home. He will follow up with Dr. Hortencia Pilar next week and keep up appointment with GI doctors at [facility] on his scheduled appointment in May.   The above was discussed with the patient. The patient's wife and daughter were agreeable to it. No change in medications. The patient will be resuming back all his home medications.   TIME SPENT: 50 minutes.   ____________________________ Hart Rochester Posey Pronto, MD sap:dmm D: 04/06/2013 19:32:37 ET T: 04/06/2013 20:08:17 ET JOB#: [id number]  cc: Jishnu Jenniges A. Posey Pronto, MD, <Dictator> Kerin Perna,  MD Ilda Basset MD ELECTRONICALLY SIGNED 04/19/2013 16:23

## 2014-05-04 NOTE — Discharge Summary (Signed)
PATIENT NAME:  Adrian Hale, Adrian Hale MR#:  [id number] DATE OF BIRTH:  1960-05-29  DATE OF ADMISSION:  11/09/2013 DATE OF DISCHARGE:  11/10/2013  ADMISSION DIAGNOSIS: Hepatic encephalopathy.   DISCHARGE DIAGNOSES:  1.  Hepatic encephalopathy.  2.  History of liver cirrhosis due to alcohol disease.   CONSULTATIONS: None.   PERTINENT LABORATORY DATA: At discharge ammonia level was 43, sodium 141, potassium 3.2, chloride 108, bicarbonate 25, BUN 7, creatinine 0.86, glucose 123. White blood cells 10.2, hemoglobin 12.6, hematocrit 37, platelets are 108,000. Magnesium 1.2. Blood cultures negative to date.   HOSPITAL COURSE: A 54 year old male who was slightly confused at home, brought in by his wife due to encephalopathy. He was found to have hepatic encephalopathy with elevated ammonia level. For further details, please refer to the H and P.   Hepatic encephalopathy: The patient's lactulose was increased from b.i.d. to t.i.d.  His mental status has actually improved. He is back to baseline. We will increase lactulose just for a few more days to t.i.d. and the go back to b.i.d. Within this time frame, he should follow up with his PCP.   Liver cirrhosis secondary to alcohol dependence: The patient was continued on Lasix and Aldactone. He no longer drinks alcohol.   Hypomagnesemia and hypokalemia, which was repleted prior to discharge.   Low-grade fever: No signs of infection. He was on empiric antibiotics, which we will continue.   Essential hypertension: At this time, we will not adjust any medications, but will need close followup with PCP.   DISCHARGE MEDICATIONS:  1.  Rifaximin 550 mg b.i.d.  2.  Nexium 40 mg daily.  3.  Folic acid 0.8 daily.  4.  Milk thistle daily.  5.  Propanolol 20 mg daily.  6.  Nasonex 50 mcg 2 sprays daily as needed.  7.  Chondroitin glucosamine 1 tablet daily.  8.  Vitamin B12 at 1000 mcg daily.  9.  Calcium with D 1 tablet daily.  10.  Lasix 40 mg b.i.d.  11.   Melatonin 10 mg at bedtime.  12.  Ventolin HFA 2 puffs 4 times a day p.r.n.  13.  Trazodone 100 mg 1.5 tablets to 2 tablets at bedtime.  14.  Spironolactone 25 mg 4 tablets daily.  15.  One-A-Day multivitamin 1 tablet daily.  16.  Ferrous sulfate 325 mg daily.  17.  Vitamin D at 1000 international units daily.  18.  Lactulose 30 mL q. 8 hours x 6 days then b.i.d.  19.  Augmentin 1 tablet p.o. b.i.d. for 5 days.   DISCHARGE DIET: Low sodium, low fat, low cholesterol.   DISCHARGE ACTIVITY: As tolerated.   DISCHARGE FOLLOWUP: The patient will follow up Dr. Hortencia Pilar in 1 week.   TIME SPENT: Approximately 35 minutes.   The patient was stable for discharge.    ____________________________ Quanita Barona P. Benjie Karvonen, MD spm:ts D: 11/10/2013 11:25:02 ET T: 11/10/2013 23:18:19 ET JOB#: [id number]  cc: Jeffrey Voth P. Benjie Karvonen, MD, <Dictator> Kerin Perna, MD Donell Beers Ryosuke Ericksen MD ELECTRONICALLY SIGNED 11/11/2013 10:36

## 2014-05-04 NOTE — Consult Note (Signed)
Referring Physician:  Albertine Patricia   Primary Care Physician:  Albertine Patricia Bells, [address] [place], [place] [address], [place] [phone]  Reason for Consult: Admit Date: 17-Aug-2013  Chief Complaint: confusion  Reason for Consult: behavior changes   History of Present Illness: History of Present Illness:   54 yo RHD M presents to [facility] secondary to behavior changes over the past 2-3 days.  Wife states that he was acting funny with behavior.  She also notes that he did have some R sided weakness as well.  These episodes are also associated with headaches in which he had multiple episodes in the past as well.  He had an outpatient EEG for these episodes at [facility] and was pending results.  These episodes are different from when he becomes sick from his liver dysfunction.  In the ER, pt had a witnessed tonic clonic seizure which required him to get 77m of Versed.  ROS:  Review of Systems   unobtainable secondary to mental status  Past Medical/Surgical Hx:  Sleep Apnea:   COPD:   Cirrhosis:   alcoholism:   hepatic encephalopathy:   Hepatitis:   cateracts: 2013  Other, see comments: liver stent.  Past Medical/ Surgical Hx:  Past Medical History reviewed by me as above   Past Surgical History reviewed by me as above   Home Medications: Medication Instructions Last Modified Date/Time  folic acid 0.8 mg oral tablet 1 tab(s) orally once a day 07-Aug-15 00:08  milk thistle oral capsule 1 cap(s) orally once a day 07-Aug-15 00:08  Xifaxan 550 mg oral tablet 1 tab(s) orally 2 times a day 07-Aug-15 00:08  Nexium 40 mg oral delayed release capsule 1 cap(s) orally once a day (in the morning) 07-Aug-15 00:08  propranolol 20 mg oral tablet 1 tab(s) orally once a day (in the morning) 07-Aug-15 00:08  Nasonex 50 mcg/inh nasal spray 2 spray(s) in each nostril once a day as needed 07-Aug-15 00:08  chondroitin-glucosamine 1 tab(s) orally once a  day 07-Aug-15 00:08  Vitamin B12 1000 mcg oral tablet 1 tab(s) orally once a day 07-Aug-15 00:08  Calcitrate with D 1 tab(s) orally once a day *6388m 07-Aug-15 00:08  furosemide 40 mg oral tablet 1 tab(s) orally 2 times a day 07-Aug-15 00:08  lactulose 10 g/15 mL oral syrup 30 milliliter(s) orally 1 to 2 times a day 07-Aug-15 00:08  lidocaine topical 5% topical ointment Apply topically to affected area once a day 07-Aug-15 00:08  melatonin 10 mg oral tablet, disintegrating 1 tab(s) orally once a day (at bedtime) 07-Aug-15 00:08  potassium chloride 20 mEq oral tablet, extended release 1 tab(s) orally 2 times a day 07-Aug-15 00:08  Ventolin HFA CFC free 90 mcg/inh inhalation aerosol 2 puff(s) inhaled 4 times a day as needed for shortness of breath/ wheezing  07-Aug-15 00:08  trazodone 100 mg oral tablet 1.5 to 2 tab(s) orally once a day (at bedtime) 07-Aug-15 00:08  spironolactone 25 mg oral tablet 4 tabs (10012morally once a day with food. 07-Aug-15 00:08  One-A-Day 50+ Multiple Vitamins oral tablet 1 tab(s) orally once a day 07-Aug-15 00:08  ferrous sulfate 325 mg oral tablet 1 tab(s) orally once a day with breakfast. 07-Aug-15 00:08   Allergies:  No Known Allergies:   Allergies:  Allergies NKDA   Social/Family History: Employment Status: disabled  Lives With: spouse  Living Arrangements: apartment  Social History: formed EtOH abuse but quite about 2 years ago, no  illicits, no tob  Family History: no seizures or stroke per wife   Vital Signs: **Vital Signs.:   07-Aug-15 04:52  Vital Signs Type Routine  Temperature Temperature (F) 97.5  Celsius 36.3  Temperature Source oral  Pulse Pulse 54  Pulse source if not from Vital Sign Device per cardiac monitor  Respirations Respirations 16  Systolic BP Systolic BP 443  Diastolic BP (mmHg) Diastolic BP (mmHg) 65  Mean BP 78  BP Source  if not from Vital Sign Device non-invasive  Pulse Ox % Pulse Ox % 100  Pulse Ox Activity Level   At rest  Oxygen Delivery Ventilator Assisted    13:00  Vital Signs Type Routine   Physical Exam: General: overweight, moderate distress  HEENT: normocephalic, sclera nonicteric, oropharynx clear  Neck: supple, no JVD, no bruits  Chest: CTA B, no wheezing, good movement  Cardiac: tachycardic, no murmur, good pulses  Extremities: no C/C/E, FROM   Neurologic Exam: Mental Status: intubated, sedated, does not open or follow, localizes, GCS 7T  Cranial Nerves: pupils 90m B and reactive, corneals B, good Dolls, intact cough  Motor Exam: localizes L>R, nl tone, no tremor  Deep Tendon Reflexes: slightly more brisk on R, mute Plantars B  Sensory Exam: grimaces to pain in all 4 ext  Coordination: untestable   Lab Results: Hepatic:  06-Aug-15 21:11   Bilirubin, Total  1.8  Alkaline Phosphatase 109 (46-116 NOTE: New Reference Range 07/31/13)  SGPT (ALT) 39 (14-63 NOTE: New Reference Range 07/31/13)  SGOT (AST)  42  Total Protein, Serum  6.2  Albumin, Serum  2.9  Routine Chem:  06-Aug-15 21:11   Ammonia, Plasma  151 (Result(s) reported on 16 Aug 2013 at 09:52PM.)  Glucose, Serum  110  BUN 7  Creatinine (comp) 0.69  Sodium, Serum 140  Potassium, Serum 4.2  Chloride, Serum  109  CO2, Serum 24  Calcium (Total), Serum  8.2  Osmolality (calc) 278  eGFR (African American) >60  eGFR (Non-African American) >60 (eGFR values <674mmin/1.73 m2 may be an indication of chronic kidney disease (CKD). Calculated eGFR is useful in patients with stable renal function. The eGFR calculation will not be reliable in acutely ill patients when serum creatinine is changing rapidly. It is not useful in  patients on dialysis. The eGFR calculation may not be applicable to patients at the low and high extremes of body sizes, pregnant women, and vegetarians.)  Anion Gap 7  07-Aug-15 05:45   Magnesium, Serum  1.3 (1.8-2.4 THERAPEUTIC RANGE: 4-7 mg/dL TOXIC: > 10 mg/dL  -----------------------)   Phosphorus, Serum 2.8 (Result(s) reported on 17 Aug 2013 at 11:02AM.)  Result Comment TROPONIN - RESULTS VERIFIED BY REPEAT TESTING.  - PREV. C/ 08-16-13 '@2218'  BY JEM..AMarland KitchenO  Result(s) reported on 17 Aug 2013 at 06:27AM.  Cardiac:  07-Aug-15 05:45   CPK-MB, Serum 3.6 (Result(s) reported on 17 Aug 2013 at 06:24AM.)  Troponin I  0.12 (0.00-0.05 0.05 ng/mL or less: NEGATIVE  Repeat testing in 3-6 hrs  if clinically indicated. >0.05 ng/mL: POTENTIAL  MYOCARDIAL INJURY. Repeat  testing in 3-6 hrs if  clinically indicated. NOTE: An increase or decrease  of 30% or more on serial  testing suggests a  clinically important change)  Routine UA:  06-Aug-15 21:46   Color (UA) Yellow  Clarity (UA) Clear  Glucose (UA) Negative  Bilirubin (UA) Negative  Ketones (UA) Negative  Specific Gravity (UA) 1.018  Blood (UA) 1+  pH (UA) 7.0  Protein (UA) 30 mg/dL  Nitrite (UA) Negative  Leukocyte Esterase (UA) Negative (Result(s) reported on 16 Aug 2013 at 10:10PM.)  RBC (UA) 2 /HPF  WBC (UA) NONE SEEN  Bacteria (UA) NONE SEEN  Epithelial Cells (UA) <1 /HPF (Result(s) reported on 16 Aug 2013 at 10:10PM.)  Routine Coag:  06-Aug-15 21:11   Prothrombin  15.7  INR 1.3 (INR reference interval applies to patients on anticoagulant therapy. A single INR therapeutic range for coumarins is not optimal for all indications; however, the suggested range for most indications is 2.0 - 3.0. Exceptions to the INR Reference Range may include: Prosthetic heart valves, acute myocardial infarction, prevention of myocardial infarction, and combinations of aspirin and anticoagulant. The need for a higher or lower target INR must be assessed individually. Reference: The Pharmacology and Management of the Vitamin K  antagonists: the seventh ACCP Conference on Antithrombotic and Thrombolytic Therapy. IBBCW.8889 Sept:126 (3suppl): [id number]. A HCT value >55% may artifactually increase the PT.  In one study,  the  increase was an average of 25%. Reference:  "Effect on Routine and Special Coagulation Testing Values of Citrate Anticoagulant Adjustment in Patients with High HCT Values." American Journal of Clinical Pathology 2006;126:400-405.)  Activated PTT (APTT) 34.3 (A HCT value >55% may artifactually increase the APTT. In one study, the increase was an average of 19%. Reference: "Effect on Routine and Special Coagulation Testing Values of Citrate Anticoagulant Adjustment in Patients with High HCT Values." American Journal of Clinical Pathology 2006;126:400-405.)  Routine Hem:  06-Aug-15 21:11   WBC (CBC) 7.2  RBC (CBC)  3.83  Hemoglobin (CBC) 13.1  Hematocrit (CBC)  38.1  Platelet Count (CBC)  106  MCV 100  MCH  34.3  MCHC 34.5  RDW  14.9  Neutrophil % 60.4  Lymphocyte % 25.9  Monocyte % 9.7  Eosinophil % 3.2  Basophil % 0.8  Neutrophil # 4.4  Lymphocyte # 1.9  Monocyte # 0.7  Eosinophil # 0.2  Basophil # 0.1 (Result(s) reported on 16 Aug 2013 at 09:42PM.)   Radiology Results: CT:    06-Aug-15 22:06, CT Head Without Contrast  CT Head Without Contrast   REASON FOR EXAM:    aphasia  COMMENTS:       PROCEDURE: CT  - CT HEAD WITHOUT CONTRAST  - Aug 16 2013 10:06PM     CLINICAL DATA:  Code stroke.Aphasia.    EXAM:  CT HEAD WITHOUT CONTRAST    TECHNIQUE:  Contiguous axial images were obtained from the base of the skull  through the vertex without intravenous contrast.    COMPARISON:  04/06/2013  FINDINGS:  Skull and Sinuses:Negative for fracture or destructive process.  Diffuse inflammatory mucosal thickening in the paranasal sinuses.  Nasopharyngealfluid related to intubation.    Orbits: Minimal imaging.  No abnormality.    Brain: No evidence of acute abnormality, such as acute infarction,  hemorrhage, hydrocephalus, or mass lesion/mass effect. 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 No surrounding soft  tissue mass. Carotid siphon  atherosclerosis.    These results were called by telephone at the time of interpretation  on 08/16/2013 at 10:31 pm to Dr. Delman Kitten , who verbally  acknowledged these results.   IMPRESSION:  1. No acute intracranial findings.  2. Intracranial atherosclerosis.      Electronically Signed    By: Jorje Guild M.D.    On: 08/16/2013 22:32         Verified By: Gilford Silvius, M.D.,   Radiology Impression: Radiology Impression: CT of  head personally reviewed by me and normal   Impression/Recommendations: Recommendations:   prior notes reviewed by me reviewed by me   Seizure-  these appear to be complex partial after seeing pts previous EEG and would explain headaches and behavior changes; concern for underlying lesion as cause Encephalopathy-  moderate, likely liver and sedation related R sided weakness-  likely Todds from 1.  but must r/o underlying mass continue Keppra 570m BID for now but plan to wean load Vimpat 2045mIV now then 10050m12h must keep Mg > 2 continue Lactulose and Xifafin needs MRI of brain w/wo contrast once extubated would wean sedation tomorrow and try to extubate pt in am even if he is not following commands will follow  Electronic Signatures: SmiJamison NeighborD)  (Signed 07-Aug-15 14:06)  Authored: REFERRING PHYSICIAN, Primary Care Physician, Consult, History of Present Illness, Review of Systems, PAST MEDICAL/SURGICAL HISTORY, HOME MEDICATIONS, ALLERGIES, Social/Family History, NURSING VITAL SIGNS, Physical Exam-, LAB RESULTS, RADIOLOGY RESULTS, Recommendations   Last Updated: 07-Aug-15 14:06 by SmiJamison NeighborD)

## 2014-05-04 NOTE — Discharge Summary (Signed)
PATIENT NAME:  Adrian Hale, Adrian Hale MR#:  [id number] DATE OF BIRTH:  03-13-1960  DATE OF ADMISSION:  08/16/2013 DATE OF DISCHARGE:  08/21/2013  PRESENTING COMPLAINT: Altered mental status, confusion and aphasia.   DISCHARGE DIAGNOSES:  1. New onset seizures.  2. History of hepatic encephalopathy.  3. History of cirrhosis.  4. History of chronic obstructive pulmonary disease.   CONDITION ON DISCHARGE: Fair.   MEDICATIONS:  1. Xifaxan 550 one tablet b.i.d.  2. Nexium 40 mg daily.  3. Folic acid 0.8 mg p.o. daily.  4. Milk thistle 1 p.o. daily.  5. Propanolol 20 mg daily.  6. Nasonex 50 mcg 2 sprays each nostril daily.  7. Chondroitin/glucosamine 1 tablet daily.  8. Vitamin B12 1000 mcg p.o. daily.  9. Calcium with vitamin D 1 tablet daily.  10. Lasix 40 mg b.i.d.  11. Lactulose 30 mL 1 to 2 times a day.  12. Lidocaine topical apply to affected area daily.  13. Melatonin 10 mg at bedtime.  14. K-Dur 20 mEq b.i.d.  15. Ventolin HFA 2 puffs 4 times a day as needed.  16. Trazodone 100 mg 1/2 tablet to 2 tablets at bedtime.  17. Spirolactone 25 mg 4 tablets p.o. daily.  18. Multivitamin p.o. daily.  19. Ferrous sulfate 325 p.o. daily.  20. Keppra 500 mg b.i.d.   DISCHARGE DIET: Low-sodium.   FOLLOWUP: With [facility] primary care physician. The patient has an appointment for next week.   CONSULTS:  1. Neurology consultation, Dr. Tamala Julian, Dr. Mallie Mussel.  2. Pulmonary consultation, Dr. Mortimer Fries, for ventilator management.   BRIEF SUMMARY OF HOSPITAL COURSE: Adrian Hale is a 54 year old gentleman with history of liver cirrhosis, hepatic encephalopathy, history of TIPS, which failed, came to the hospital due to altered mental status and confusion. He was admitted with:   1. Acute encephalopathy/altered mental status likely multifactorial related to hepatic encephalopathy and postictal from a seizure. He was intubated on the ventilator for airway protection. CT head was negative. He was continued on  lactulose and Xifaxan with decreasing ammonia levels. He was also on IV Keppra and IV Vimpat; however, Vimpat was discontinued after discussing with neurology. EEG results were noted, no seizure, no epileptiform activity was noted. MRI brain showed small left cerebellar cavernous angioma which per Neuro not a focus of seizures. Overall, the patient improved.  2. History of cirrhosis of liver with hepatic encephalopathy. He remained stable. Ammonia trending down. Continue lactulose and Xifaxan along with spironolactone and Lasix.  3. Acute respiratory failure in the setting of seizure disorder. The patient was intubated for airway protection, extubated and doing well.  4. Chronic thrombocytopenia due to chronic liver disease, stable.   Hospital stay otherwise remained stable.   CODE STATUS: The patient remained a Full Code.   TIME SPENT: 40 minutes.   ____________________________ Hart Rochester Posey Pronto, MD sap:JT D: 08/21/2013 14:58:21 ET T: 08/21/2013 18:01:01 ET JOB#: [id number]  cc: Gabriella Guile A. Posey Pronto, MD, <Dictator> [facility] [place] Ilda Basset MD ELECTRONICALLY SIGNED 08/22/2013 13:54

## 2014-05-05 NOTE — Consult Note (Signed)
Brief Consult Note: Diagnosis: Alcohol dependence, hepatic encephalopathy.   Patient was seen by consultant.   Consult note dictated.   Recommend further assessment or treatment.   Orders entered.   Comments: Adrian Hale has a long h/o alcoholism. He continues to drink in spite of alcoholic liver disease and encephalopathy. He was admitted for alcohol withdrawal seizures and AMS. The patient has legal problems and could take treatment instead of going to jail but prefers to go to jail.  Adrian Hale does not feel like "talking" today. He feels "good".  PLAN: 1. PLease continue CIWA protocol.  2. The patient seems marginally interested in participation in alcohol rehab program. Given his situation, I will make a referral to [facility] facility. He will likely be discharged before bed available but will have a choice to participate.   3. We contacted his probation officer. There is likely arest warrant.   4. I will follow up.  Electronic Signatures: Orson Slick (MD)  (Signed 02-Jul-13 13:04)  Authored: Brief Consult Note   Last Updated: 02-Jul-13 13:04 by Orson Slick (MD)
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
## 2014-05-05 NOTE — Consult Note (Signed)
Comments   I met with pt and his sister, Adrian Hale. Pt will either go to jail at discharge or home with his sister, Adrian Hale. Pt says that ultimately he will return to his home in [place]. Sister feels that it is doubtful that pt can maintain sobriety if he returns to this environment but she also realizes that this is pt's decision. We discussed pt's request to be a full code but not to be maintained by artificial means if care were futile. Sister is in agreement with this. At present, pt's decision-maker would be his wife (legally separated but not divorced). Pt would like for sisiter, Adrian Hale, to be his HCPOA. Chaplain to see pt to complete paper work.   Electronic Signatures: Estephania Licciardi, Izora Gala (MD)  (Signed 01-Jul-13 13:13)  Authored: Palliative Care   Last Updated: 01-Jul-13 13:13 by Rosangela Fehrenbach, Izora Gala (MD)

## 2014-05-05 NOTE — Discharge Summary (Signed)
PATIENT NAME:  Adrian Hale, Adrian Hale MR#:  [id number] DATE OF BIRTH:  December 12, 1960  DATE OF ADMISSION:  07/10/2011 DATE OF DISCHARGE:  07/17/2011  PRIMARY CARE PHYSICIAN: Dr. Robyn Haber in [place]:  1. Alcohol withdrawal.  2. Alcohol withdrawal seizure.  3. Bacteremia with Lactococcus garvieae and possible aspiration pneumonia.  4. Tobacco abuse.  5. Cirrhosis with thrombocytopenia and coagulopathy and hepatic encephalopathy.  6. Elevated troponin.  7. Hypokalemia and hypomagnesemia.  MEDICATIONS ON DISCHARGE:  1. Calcium and vitamin D daily.  2. Folic acid 0.8 mg daily. 3. Milk thistle oral capsule daily.  4. Nadolol 20 mg daily.  5. Xifaxan 550 mg twice a day. 6. Red yeast rice 600 mg daily. 7. Potassium gluconate 595 mg daily.  8. Melatonin 3 mg orally daily.  9. Nexium 40 mg daily.  10. Keppra 500 mg daily for six days then stop.  11. Lactulose 30 mL b.i.d.  12. Amoxicillin 500 mg every eight hours for 10 more days.  13. Nicotine patch one patch transdermal daily.   DIET: Low sodium diet.   ACTIVITY: Activity as tolerated.   FOLLOW UP: Follow up in one week with Dr. Robyn Haber in [place].  REASON FOR ADMISSION: Patient is admitted 06/29, came in with possible delirium tremens.   HISTORY OF PRESENT ILLNESS: 54 year old man with alcoholic cirrhosis, hepatic encephalopathy, failed TIPS procedure, ongoing alcohol and tobacco abuse, came in after recurrent seizures at home. Patient was given Ativan in the Emergency Room. Was also noted to have a fever and positive troponin. Is admitted possible delirium tremens with alcohol withdrawal seizure, hepatic encephalopathy. Admitted to the Intensive Care Unit, started on Precedex drip and CIWA protocol. He was given IV fosphenytoin for seizure. Lactulose enemas were ordered for hepatic encephalopathy. Potassium was replaced.   CONSULTATIONS DURING THE HOSPITAL COURSE:  1. Dr. Ermalinda Memos from palliative care.   2. Dr. Bary Leriche from psychiatry. 3. Dr. Michaela Corner from neurology. 4. Dr. Clayborn Bigness from infectious disease.   LABORATORY, DIAGNOSTIC, AND RADIOLOGICAL DATA: EKG showed normal sinus rhythm, 97 beats per minute, nonspecific ST-T wave changes. Magnesium 0.9 on admission. Troponin 9.79, salicylates less than 1.7. INR 1.9. White blood cell count 5.8, hemoglobin and hematocrit 12.3 and 34.7, platelet count 41. Ethanol level less than 3. BMP showed glucose 123, BUN 6, creatinine 0.58, sodium 139, potassium 3.3, chloride 103, CO2 27, calcium 7.9. Liver function tests: Total bilirubin up at 9.1, alkaline phosphatase 154, ALT 51, AST 122, albumin 2.7. Urine toxicology positive for cannabis. Acetaminophen less than 2. Urinalysis 1+ blood. Initial blood cultures on 06/29 were negative. Chest x-ray: No acute cardiopulmonary disease. Right ribs unilaterally no displaced rib fracture. CT scan of the head showed no acute intracranial process. Next troponin borderline at 0.34. Ammonia 48. Magnesium replaced up to 1.7. Dilantin on the 30th 8.7. Third troponin 0.29. On 06/30 platelet count dipped down to 25. Blood cultures on 07/02 grew out Lactococcus garvieae in all four bottles pansensitive. Urine culture repeat was negative. MRI of the brain showed no acute intracranial pathology; that was with and without contrast. Repeat chest x-ray showed findings suggest developing pneumonia left upper lobe. Ultrasound of the abdomen showed liver is enlarged, multiple cysts, varices present, enlarged spleen, sludge in the gallbladder but no Murphy's sign. Repeat blood cultures on 07/04 were negative. Platelet count upon discharge 67. Hemoglobin upon discharge 9.3. White blood cell count upon discharge 5.2.   HOSPITAL COURSE PER PROBLEM LIST:  1. For the patient's alcohol withdrawal,  possible delirium tremens, the patient was put on CIWA protocol. Nursing protocol driven. Upon discharge no signs of withdrawal. Walked around well  with me in the room and yesterday in the hallway. 2. Alcohol withdrawal seizure. Initially patient was placed on fosphenytoin and when the patient had a fever this was changed over to [place] in case this was a drug fever. Dr. Loletta Specter from neurology saw and believed there was no reason to keep the seizure medication going. He advised to taper down off the Keppra over a week period and then stop.  3. Bacteremia with Lactococcus garvieae and possible aspiration pneumonia. I am not sure how he developed this. He spiked a fever of 102 on 07/02 and blood cultures were drawn and positive. Patient was seen in consultation by Dr. Clayborn Bigness. Patient was initially placed on Zosyn and Zithromax and then vancomycin was added. Once the cultures were positive the Zosyn was stopped and he was kept on vancomycin and Zithromax by Dr. Clayborn Bigness and then Dr. Clayborn Bigness changed it over to amoxicillin upon discharge since this is pansensitive. Patient's white count is normal. Will need another 10 days of antibiotic. Patient was afebrile. Temperature 98.4 upon discharge.  4. Tobacco abuse. Smoking cessation counseling done during the hospitalization. Nicotine patch applied.  5. Cirrhosis with thrombocytopenia, coagulopathy, hepatic encephalopathy: I explained to the patient if he wants to stay above ground he must stop drinking, the alcohol is slowly killing him. He will continue his Nadolol, Xifaxan, lactulose and follow up with his medical doctor.  6. Elevated troponin, most likely supply demand ischemia, from the seizure.  7. Hypokalemia and hypomagnesemia, this was replaced IV and orally during the hospitalization and he will go on his usual oral medications.   PROGNOSIS: Overall prognosis poor especially if he goes back to drinking.   TIME SPENT ON DISCHARGE: 35 minutes.   ____________________________ Tana Conch. Leslye Peer, MD rjw:cms D: 07/17/2011 13:25:38 ET T: 07/19/2011 12:15:13 ET JOB#: [id number]  cc: Tana Conch. Leslye Peer,  MD, <Dictator> Dr. Robyn Haber in [place] MD ELECTRONICALLY SIGNED 07/26/2011 13:14

## 2014-05-05 NOTE — Consult Note (Signed)
PATIENT NAME:  Adrian Hale, Adrian Hale MR#:  [id number] DATE OF BIRTH:  [birth year]  DATE OF CONSULTATION:  07/16/2011  REFERRING PHYSICIAN:  Dr. Ether Griffins CONSULTING PHYSICIAN:  Heinz Knuckles. Akiko Schexnider, MD  REASON FOR CONSULTATION: Bacteremia.   HISTORY OF PRESENT ILLNESS: The patient is a 54 year old white man with a past history significant for alcohol abuse with alcoholic cirrhosis who was admitted on 06/29 with alcohol withdrawal seizures and DTs. He was found to be having seizures at home and was brought by EMS to the Emergency Room. He was given Ativan and was noted to have a fever on admission as well. He was subsequently admitted to the hospital. He was initially not on any antibiotics and blood cultures from admission were negative. His temperature curve showed some low-grade temperatures in the 99 to 100 range until 07/02 when he spiked a fever to 102.2 and then subsequently went as high as 103. He has been started on aztreonam and Zosyn and vancomycin has been added. He had blood cultures at that time which grew Lactococcus garvieae. A urinalysis both on 07/02 and on admission showed no evidence for infection. Urine culture from 07/02 was negative. He had repeat blood cultures from 07/04 which show no growth to date. The patient was alert and oriented, but had difficulty giving much of a symptom report from the time that he was admitted due to seizures and his confusion at that time. Currently, he has complaints of pain on the right side of his chest and is concerned with a possible rib fracture. He denies any current change in his bowels. He denies any further seizures since admission.   ALLERGIES: None.   PAST MEDICAL HISTORY:  1. Alcohol abuse.  2. Alcoholic cirrhosis with encephalopathy.  3. Status post TIPS which was unsuccessful.  4. Hypertension.   SOCIAL HISTORY: The patient owns a home and he has boarders who live with him. He drinks about a pint of hard liquor per day. He smokes 1/2 pack  of cigarettes per day. He has a cat at home.   FAMILY HISTORY: Positive for hypertension and stroke.  REVIEW OF SYSTEMS: He was unable to provide history of what was going on prior to his admission. He had fevers very recently and currently has some malaise. Despite being able to talk and answer questions, it was difficult to get a good history from him.   PHYSICAL EXAMINATION:  VITAL SIGNS: T-max of 103, T-current 99.5, pulse 73, blood pressure 106/67, 95% on room air.   GENERAL: A 54 year old white man in no acute distress.   HEENT: Normocephalic, atraumatic. Pupils equal and reactive to light. Extraocular motion intact. Sclerae, conjunctivae, and lids without evidence for emboli or petechiae. Oropharynx shows no erythema or exudate. Teeth and gums are in fair condition.   NECK: Supple. Full range of motion. Midline trachea. No lymphadenopathy. No thyromegaly.   LUNGS: Clear to auscultation bilaterally with good air movement. No focal consolidation.   HEART: Regular rate and rhythm without murmur, rub, or gallop.   ABDOMEN: Soft, mildly distended. No hepatosplenomegaly. No hernia is noted. No tenderness.   EXTREMITIES: No evidence for tenosynovitis.   SKIN: Positive scleral icterus. No significant jaundice. No rashes noted. No stigmata of endocarditis, specifically no Janeway lesions or Osler nodes.   NEUROLOGIC: The patient was awake and interactive. He was oriented x3.   PSYCHIATRIC: Mood and affect appeared normal.   LABORATORY, DIAGNOSTIC, AND RADIOLOGICAL DATA: BUN 5. Creatinine 0.88, bicarbonate 25, anion gap of 5. AST  110, ALT 44, alkaline phosphatase 132, total bilirubin 8.8, total protein 5.6, albumin 2.4. Tox screen on admission was positive for cannabinoids. White count from yesterday was 5.1, hemoglobin 9.9, platelet count of 34, ANC of 2.5. Blood cultures from admission show no growth. Blood cultures from 07/02 are growing four out of four bottles positive for Lactococcus  garvieae. Urine culture from 07/02 was negative. Urinalysis from admission and 07/02 are unremarkable. Blood cultures from 07/04 show no growth to date. A chest x-ray from admission shows no acute cardiopulmonary disease. Right rib x-rays showed no displaced rib fracture. A CT scan of the head without contrast showed no acute intracranial process. MRI of the brain with and without contrast from 07/02 showed no acute intracranial process. Chest x-ray from 07/02 showed possible mild congestive heart failure present and possible left upper lobe infiltrate developing. Abdominal ultrasound showed enlarged liver with heterogeneous echotexture and multiple cysts with bidirectional portal venous flow and varices present. The spleen was enlarged. There was sludge in the gallbladder, but no positive Murphy sign. Pancreas was unremarkable.   IMPRESSION: This is a 54 year old white man with a history of alcoholic cirrhosis admitted with alcohol withdrawal seizures who had Lactococcus garvieae in the blood.   RECOMMENDATIONS:  1. His initial blood cultures were negative. He had fever of 102 on 07/02. Repeat blood cultures grew Lactococcus. He was started on antibiotics and subsequent blood cultures are negative to date. Lactococcus garvieae is often found as a pathogen in fish, but has been found to cause human disease including endocarditis (see article placed in the chart). There is little to suggest that he has endocarditis, given that he had a negative culture on admission, but with his cirrhosis and his fever spike, I would treat him for bacteremia.  2. His fever is resolved and he has never had leukocytosis. Susceptibilities are pending. They are strains of Lactococcus garvieae which are very resistant to antibiotics (see second article in chart). We will need to wait for the sensitivities to return. Macrolides and vancomycin tend to have good activity.  3. Will continue vancomycin and azithromycin. Will stop the  Zosyn.  4. We will follow his fever curve.   This is a moderately complex infectious disease case. Thank you very much for involving me in Mr. Mastro care.   ____________________________ Heinz Knuckles. Hunter Bachar, MD meb:ap D: 07/16/2011 14:23:44 ET T: 07/16/2011 15:12:55 ET JOB#: [id number]  cc: Heinz Knuckles. Delcia Spitzley, MD, <Dictator> Jnyah Brazee E Curtiss Mahmood MD ELECTRONICALLY SIGNED 07/20/2011 12:09

## 2014-05-05 NOTE — Consult Note (Signed)
Impression: 54yo WM w/ h/o alcoholic cirrhosis admitted with EtOH withdrawal seizures who has Lactococcus garvieae in the blood.  His initial blood cultures were negative.  He had fever >102 on 7/2.  Repeat BCx grew the Lactococcus.  He was started on antibiotics and subsequent BCx are negative.  L. garvieae is often found as a pathogen in fish, but has been found to cause human disease, including endocarditis (see article placed in chart).  There is little to suggest that the has endocarditis given that he had negative cultures on admission, but with his cirrhosis and his fever spike, I would treat him for bacteremia. His fever has resolved and he never had leukocytosis.  Susceptibilities are pending.  There are strains of Lactococcus garvieae which are very resistant to antibiotics.  Will need to wait until the sensitivities return.  Macrolides and vanco tend to have good activity. Will continue vanco and azithro.  Will stop the zosyn. Follow fever curve.  Electronic Signatures: Shankar Silber, Heinz Knuckles (MD)  (Signed on 05-Jul-13 14:09)  Authored  Last Updated: 05-Jul-13 14:09 by Io Dieujuste, Heinz Knuckles (MD)

## 2014-05-05 NOTE — H&P (Signed)
PATIENT NAME:  Adrian Hale, AGUAS MR#:  [id number] DATE OF BIRTH:  1960/11/09  DATE OF ADMISSION:  07/10/2011  CHIEF COMPLAINT: Delirium tremens.   HISTORY OF PRESENT ILLNESS: Mr. Stai is a 54 year old white male with a history of alcoholic cirrhosis with hepatic encephalopathy and prior failed TIPS procedures, with ongoing alcohol and tobacco abuse who was brought from home by EMS after his nephew witnessed 8 or 9 hours of recurrent seizures at home. Per nephew the gentleman had a court date on Wednesday, which he did not keep. Apparently he showed up for his court date but left before he was summoned and walked home. There was no witnessed alcohol use at that time, but there was opportunity per nephew. The nephew followed him home and at some point on Wednesday evening forced him to refrain from any continued alcohol use and has been with him apparently since Wednesday. For the last 12 hours, he has noticed recurrent seizures, but the patient has been conversant and cooperative. It is unclear what finally instigated the call to EMS. Apparently there is no other closer family member due to the patient's ongoing alcoholism. In the ED, he was noted to have a witnessed seizure and has received four doses of Ativan 1 mg x3 followed by a 2 mg dose by the ER physician. He was also noted to have a fever to 103 rectally and positive troponins. He underwent a head CT which was negative and a chest x-ray and rib films which were also negative for fractures and pneumonia. In the ED, he was observed to have altered mental status. He was able to answer simple questions in one-word answers, but continues to exhibit signs and symptoms of delirium tremens.   PAST MEDICAL HISTORY:  1. Cirrhosis with hepatic encephalopathy status post failed TIPS procedure, year unclear.  2. Recurrent admissions for alcoholic gastritis and alcoholic intoxication.  3. Prior behavioral health admissions for detox.  4. Hypertension.   5. Tobacco abuse.   PAST SURGICAL HISTORY:  1. History of TIPS procedure which apparently failed. 2. History of right breast lump resection, records unavailable.  DRUG ALLERGIES: No known drug allergies.   HOME MEDICATIONS:  1. Xifaxan 550 mg 1 tablet twice daily.  2. Vitamin B1. 3. Vitamin B12. 4. Pantoprazole 40 mg daily.  5. Neomycin 1000 mg every six hours. 6. Nadolol 20 mg once daily.  7. Multivitamin. 8. Milk thistle. 9. Lactulose 30 mL every six hours. 10. Glucosamine/chondroitin sulfate. 11. Folic acid 1 mg daily.  12. Ferrous sulfate 1 tablet daily. 13. Calcium 600+ daily.   HOSPITALIZATIONS: His last hospitalization was in July 2012 for alcohol intoxication. He was referred for outpatient rehab as he did not meet criteria for inpatient detox at that time. He also had an admission in May 3500 for alcoholic gastritis.   FAMILY HISTORY: Notable for hypertension and stroke. There are no first degree family members available for further questioning at this time. He does have a sister who is a Therapist, sports who is en route from [place].   SOCIAL HISTORY: He lives alone. He continues to drink about a pint of vodka daily and smoke a 1/2 pack of cigarettes daily. His last alcohol use is unknown, but may have been Wednesday, which is three days prior to admission.  REVIEW OF SYSTEMS: Not available due to the patient's mental status.   PHYSICAL EXAMINATION:   GENERAL: This is a middle-aged male who is currently delirious, lying in bed. He does make eye contact.  ADMISSION VITAL SIGNS: Temperature 100.9 and repeat 103 rectal, pulse sinus tach at 108 to 120, respirations 24 to 30, systolic blood pressure initially 148/84 and repeat 114/51, and pulse oximetry ranges from 98 to 91% with 2 liters.   HEENT: Pupils are pinpoint and reactive. Sclerae are mildly icteric. Oropharynx is notable for dry mucosa and poor dentition.   NECK: Supple without lymphadenopathy, JVD, thyromegaly carotids, or  carotid bruits.   LUNGS: Clear anteriorly.   CARDIOVASCULAR: Tachycardic with a systolic ejection murmur, nonradiating. Chest wall appears to be nontender. He has intact pedal pulses and no lower extremity edema.   ABDOMEN: Distended. Appears to be nontender. Hypoactive bowel sounds. No evidence of hepatosplenomegaly.   EXTREMITIES: He is moving all four extremities but does not follow commands.   SKIN: Skin is warm and dry without rashes or lesions. There are scattered bruises on his arms.   LYMPH: There is no cervical, axillary, inguinal, or supraclavicular lymphadenopathy.   NEUROLOGICAL: He is able to protect his airway. Deep tendon reflexes are intact. Sensation appears to be intact. He does grimace to pain. He is able to speak, but answers with one word questions due to the agitation and disorientation.   ADMISSION LABORATORY, DIAGNOSTIC AND RADIOLOGIC DATA: Sodium 139, potassium 3.3, chloride 103, bicarbonate 27, BUN 6, creatinine 0.5, glucose 123, total protein 6.3, albumin 2.7, total bilirubin 9.1, alkaline phosphatase 154, AST 122, and ALT 51. CK 496, CK-MB 3.9, and troponin I 0.26. Urine drug screen was positive for marijuana. Alcohol level was less than 3. CBC: Hemoglobin 12.3, white count 5.8, hematocrit 34.7, platelets 41, and MCV 105. PT 22.2 and INR 1.9.   Urinalysis has 1+ blood, 100 protein, trace bacteria, and 2 white cells.   Acetaminophen level is less than 2. Salicylate level is less than 1.7.   CT of head shows no evidence of midline shift, mass effect, or extra axial fluid collections. Nothing acute.  One view of the chest shows no acute pulmonary disease. There are old left lateral rib fractures and cephalization of pulmonary vasculature.  Unilateral right ribs shows no displaced rib fractures and no pneumothorax. There is a stent overlying the right upper quadrant, likely representing a TIPS shunt.   ASSESSMENT AND PLAN:  1. Delirium tremens secondary to  alcoholic withdrawal seizures complicated by hepatic encephalopathy with chronic hyperammonemia. Admit to the Intensive Care Unit. He able to protect his airway and answering simple questions so he was not intubated in the emergency room. He was given a dose of Haldol and retention enema was ordered prior to leaving the ER. I have discussed his prognosis with his nephew and his nephew has called his sister who is a Therapist, sports who is on her way from [place]. CIWA protocol has been ordered. We will continue lactulose retention enemas every six hours and p.r.n. IV Ativan. Fosphenytoin has been ordered by the ED physician, but at the time of dictation, had not been given yet.  2. Hepatic encephalopathy. Again lactulose enemas have been ordered. He has chronic hyperammonemia from cirrhosis. He is status post failed TIPS procedure.  3. Hypokalemia. We will check a stat magnesium and replace as needed.  4. Elevated troponin secondary to demand ischemia from recurrent seizures. He has a prior echo from July 2012 admission showing normal ejection fraction and no evidence of cardiomyopathy.  5. Thrombocytopenia secondary to chronic alcohol use and cirrhosis.  6. Prognosis. Poor given his ongoing alcohol abuse and lack of adequate support due to alienation of  family. I plan to discuss end-of-life issues with his sister when she is here and determine whether she would be comfortable with hospice referral as there is no good outcome for this poor man given his advanced disease.   ESTIMATED ICU CARE: 60 minutes. ____________________________ Deborra Medina, MD tt:slb D: 07/10/2011 14:48:01 ET T: 07/10/2011 15:32:23 ET JOB#: [id number]  cc: Deborra Medina, MD, <Dictator> Deborra Medina MD ELECTRONICALLY SIGNED 07/19/2011 13:18

## 2014-05-05 NOTE — Consult Note (Signed)
Brief Consult Note: Diagnosis: Alcohol dependence, hepatic encephalopathy.   Patient was seen by consultant.   Consult note dictated.   Recommend further assessment or treatment.   Orders entered.   Comments: Mr. Scholz has a long h/o alcoholism. He continues to drink in spite of alcoholic liver disease and encephalopathy. He was admitted for alcohol withdrawal seizures and AMS. The patient has legal problems and could take treatment instead of going to jail but prefers to go to jail.  PLAN: 1. PLease continue CIWA protocol.  2. The patient seems marginally interested in participation in alcohol rehab program. Given his situation, I will make a referral to [facility] facility. He will likely be discharged before bed available but will have a choice to participate.   3. We contacted his probation officer. There is likely arest warrant.   4. I will follow up.  Electronic Signatures: Orson Slick (MD)  (Signed 01-Jul-13 18:05)  Authored: Brief Consult Note   Last Updated: 01-Jul-13 18:05 by Orson Slick (MD)

## 2014-05-05 NOTE — Consult Note (Signed)
PATIENT NAME:  Adrian Hale, SOULE MR#:  [id number] DATE OF BIRTH:  Jan 17, 1960  DATE OF CONSULTATION:  07/12/2011  REFERRING PHYSICIAN:  Deborra Medina, MD  CONSULTING PHYSICIAN:  Dasan Hardman B. Jenson Beedle, MD  REASON FOR CONSULTATION: To evaluate a patient with severe alcoholism.   IDENTIFYING DATA: Adrian Hale is a 54 year old man with history of alcoholism.   CHIEF COMPLAINT: "I don't know what to do".   HISTORY OF PRESENT ILLNESS: Adrian Hale has a long history of alcoholism with alcoholic liver disease and hepatic encephalopathy. He has been hospitalized at [facility] for alcohol intoxification before. Reportedly he has been drinking heavily lately. This is due to added stress of ongoing divorce. Divorce has not been finalized yet but apparently it's very close to completion. The patient had criminal court date last Wednesday. He failed to appear. There may be an arrest warrant out on him already. He reportedly showed up for court but left before he was summoned. He went home and his nephew urged him to stop drinking. As a result, the patient developed alcohol withdrawal seizures. His nephew watched him for many hours, close to 100, having recurrent seizures. He eventually brought him to the Emergency Room. In the Emergency Room, he had more witnessed seizures. The patient was admitted to CCU. He confirms heavy drinking lately. He is slightly confused and his sister helps him with the details during the interview. The patient is on probation and as a part of a deal he was offered choice of going to jail for 60 days or going for alcohol treatment. The patient appears to choose jail over treatment. This is mostly because if he served his time his legal trouble would be over. He seems to be very picky about substance abuse program he would like to participate in. Unfortunately, he has no health insurance and no financial resources. Therefore, his choices are very limited. He denies symptoms  of depression, anxiety, or psychosis. There is no other than alcohol substance use.   PAST PSYCHIATRIC HISTORY: He was at the [facility] in [place] twice. The longest period of sobriety was six months when he participated in a residential program. He denies ever being treated for psychiatric problems. There were no hospitalizations. No suicide attempts. In addition to [facility], he has been to [facility] in [place] twice. He has been to [facility] and some other programs in [place].   FAMILY PSYCHIATRIC HISTORY: Mother with alcoholism. One sister with alcoholism. No history of completed suicides in the family.   PAST MEDICAL HISTORY:  1. Alcoholic liver disease. 2. Hepatic encephalopathy. 3. Hypertension. 4. Status post TIPS placement.   DRUG ALLERGIES: No known drug allergies.   MEDICATIONS ON ADMISSION:  1. Xifaxan 550 mg twice daily. 2. Red Yeast Rice 600 mg daily. 3. Propranolol 20 mg daily.  4. Potassium gluconate 295 mg daily.  5. Nexium 40 mg daily.  6. Nadolol 20 mg daily. 7. Melatonin 3 mg at night. 8. Folic acid 0.8 mg daily. 9. Calcium 600 Plus Vitamin D daily.   MEDICATIONS AT THE TIME OF CONSULTATION:  1. CIWA protocol. 2. Lactulose every six hours. 3. Nadolol 20 mg daily.  4. Protonix 40 mg IV. 5. Norco as needed.  6. Dilantin 100 mg q.8 hours. 7. Vitamin K 5 mg daily. 8. Potassium chloride 20 mEq daily.   SOCIAL HISTORY: He is separated from his wife, soon to be divorced. Property has been divided but the patient needs to go to a  lawyer's office to sign the papers. Divorce will be final automatic after that. He is on probation and has been in touch with his probation officer. We called him too. They are rather upset with his poor compliance with their recommendation. There may be arrest warrant out already. He is living by himself. One of his sisters has been helping. She has healthcare power-of-attorney. She believes she needs  power-of-attorney in order to continue helping him especially if the patient goes for treatment or to jail. He used to work as an Chief Financial Officer. Has not been employed for a year. He reports drinking a pint of vodka daily.   REVIEW OF SYSTEMS: CONSTITUTIONAL: No fevers or chills. Positive for fatigue. EYES: No double or blurred vision. ENT: No hearing loss. RESPIRATORY: No shortness of breath or cough. CARDIOVASCULAR: No chest pain or orthopnea. GASTROINTESTINAL: No abdominal pain, nausea, vomiting, or diarrhea. GU: No incontinence or frequency. ENDOCRINE: No heat or cold intolerance. LYMPHATIC: No anemia or easy bruising. INTEGUMENTARY: No acne or rash. MUSCULOSKELETAL: No muscle or joint pain. NEUROLOGIC: No tingling or weakness. PSYCHIATRIC: See history of present illness for details.   PHYSICAL EXAMINATION:   VITAL SIGNS: Blood pressure 117/70, pulse 70, respirations 19, temperature 98.8.   GENERAL: This is an obese male seen in CCU in no acute distress. The rest of the physical examination is deferred to his primary attending.   LABORATORY DATA: Glucose 108, BUN 4, creatinine 0.66, sodium 141, potassium 3.4. LFTs total protein 5.6, albumin 2.4, total bilirubin 8.8, alkaline phosphatase 132, AST 110, ALT 44. Serum Dilantin 8.7. Urine tox screen positive for cannabinoids. CBC white blood count 5, red blood count 2.78, hemoglobin 10.2, platelet count 23, MCV 106. Urinalysis is not suggestive of urinary tract infection. Blood culture negative.   MENTAL STATUS EXAMINATION: The patient is alert and oriented to person, place, time, and situation although he does not recall any details leading to his admission. He understands how he ended up in the hospital. He is pleasant, polite, and cooperative. He is wearing hospital gown. He maintains good eye contact. His speech is soft. Mood is okay with flat affect. Thought processing is logical and goal oriented. Thought content he denies suicidal or homicidal ideation.  There are no delusions or paranoia. There are no auditory or visual hallucinations. His cognition is grossly intact. His insight and judgment are questionable.   SUICIDE RISK ASSESSMENT: This is a patient with lifelong history of alcoholism who was brought to the hospital with symptoms of alcohol withdrawal. He denies thoughts of hurting himself or others but has extremely poor insight into his problems, refuses treatment and has been drinking himself to death.   INITIAL DIAGNOSES:  AXIS I:  1. Alcohol dependence. 2. Alcoholic encephalopathy.   AXIS II: Deferred.   AXIS III:  1. Hypertension. 2. Alcoholic liver disease. 3. Pancytopenia.   AXIS IV: Substance abuse, employment, financial, housing, access to care, primary support, legal, ongoing divorce.   AXIS V: GAF 35.   PLAN:  1. I spoke extensively with the patient and his sister. They both seem to be picky about his alcohol treatment. The patient is uncertain if he wants one. He would much rather go to jail for 60 days. He may have his wish fulfilled if arrest warrant is out there for him. The sister would prefer for the patient go to treatment rather than jail but she does not support referral to [facility] facility in [place]. Unfortunately, this is the only viable option for  someone without health insurance. It is possible that he is already sentenced to substance abuse treatment through the court system to a program called [facility]. The patient could go to other places, [place], [place] if he has cash available.  2. Please continue CIWA protocol.  3. I think that I will refer this patient to [facility] whether he likes it or not. It usually takes several days to obtain a bed, sometimes longer than that. He will likely be discharged to home with his sister to take care of his divorce and legal problems before checking himself into [facility] facility in [place].  4. I will follow-up.     ____________________________ Wardell Honour. Bary Leriche, MD jbp:drc D: 07/12/2011 17:58:15 ET T: 07/13/2011 09:41:35 ET JOB#: [id number]  cc: Ajeet Casasola B. Bary Leriche, MD, <Dictator> Clovis Fredrickson MD ELECTRONICALLY SIGNED 07/14/2011 19:58

## 2014-05-05 NOTE — Consult Note (Signed)
Brief Consult Note: Diagnosis: Alcohol dependence, hepatic encephalopathy, delirium -alcohol withdrawal and medical resolving..   Patient was seen by consultant.   Consult note dictated.   Recommend further assessment or treatment.   Orders entered.   Discussed with Attending MD.   Comments: Mr. Faria has a long h/o alcoholism. He continues to drink in spite of alcoholic liver disease and encephalopathy. He was admitted for alcohol withdrawal seizures and AMS. The patient has legal problems and could take ubstance abuse treatment instead of going to jail.  Mr. Eppinger is alert and fully oriented today. He is rather emotional as his roommates/renters have been mean to him on the phone today. He is also worried about possible infection after talking to Dr. Clayborn Bigness today. He is still undecided about substance treatment. He forgot all about legal problems and feels he needs to go home to pay his bills.   PLAN: 1. The patient seems marginally interested in participation in alcohol rehab program. The patient has no financial resources of his own or health insurance.  2. We made [facility] referral and his bed could be available anytime.   3. We discussed [facility], long term residential treatment program.    4. His probation officer is "fed up". There is likely an arest warrant.   5. I will follow up.  Electronic Signatures: Orson Slick (MD)  (Signed 05-Jul-13 23:25)  Authored: Brief Consult Note   Last Updated: 05-Jul-13 23:25 by Orson Slick (MD)

## 2014-10-22 ENCOUNTER — Encounter: Payer: Self-pay | Admitting: Anesthesiology

## 2014-10-23 ENCOUNTER — Encounter
Admission: RE | Admit: 2014-10-23 | Discharge: 2014-10-23 | Disposition: A | Discharge status: Home / Self Care | Payer: Medicare Other | Source: Ambulatory Visit | Attending: Podiatry | Admitting: Podiatry

## 2014-10-23 DIAGNOSIS — Z01818 Encounter for other preprocedural examination: Secondary | ICD-10-CM | POA: Insufficient documentation

## 2014-10-23 DIAGNOSIS — M2012 Hallux valgus (acquired), left foot: Secondary | ICD-10-CM | POA: Diagnosis not present

## 2014-10-23 HISTORY — DX: Cellulitis of right lower limb: L03.115

## 2014-10-23 HISTORY — DX: Cellulitis of left lower limb: L03.116

## 2014-10-23 HISTORY — DX: Anxiety disorder, unspecified: F41.9

## 2014-10-23 HISTORY — DX: Sleep apnea, unspecified: G47.30

## 2014-10-23 HISTORY — DX: Unspecified convulsions: R56.9

## 2014-10-23 LAB — DIFFERENTIAL
BASOS ABS: 0.1 10*3/uL (ref 0–0.1)
Basophils Relative: 1 %
Eosinophils Absolute: 0.3 10*3/uL (ref 0–0.7)
Eosinophils Relative: 3 %
LYMPHS ABS: 2.2 10*3/uL (ref 1.0–3.6)
Monocytes Absolute: 1.2 10*3/uL — ABNORMAL HIGH (ref 0.2–1.0)
NEUTROS ABS: 4.7 10*3/uL (ref 1.4–6.5)
Neutrophils Relative %: 56 %

## 2014-10-23 LAB — HEPATIC FUNCTION PANEL
ALT: 23 U/L (ref 17–63)
AST: 32 U/L (ref 15–41)
Albumin: 3.4 g/dL — ABNORMAL LOW (ref 3.5–5.0)
Alkaline Phosphatase: 83 U/L (ref 38–126)
BILIRUBIN DIRECT: 0.5 mg/dL (ref 0.1–0.5)
BILIRUBIN TOTAL: 3.2 mg/dL — AB (ref 0.3–1.2)
Indirect Bilirubin: 2.7 mg/dL — ABNORMAL HIGH (ref 0.3–0.9)
Total Protein: 6.5 g/dL (ref 6.5–8.1)

## 2014-10-23 LAB — BASIC METABOLIC PANEL
ANION GAP: 7 (ref 5–15)
BUN: 8 mg/dL (ref 6–20)
CHLORIDE: 105 mmol/L (ref 101–111)
CO2: 27 mmol/L (ref 22–32)
Calcium: 8.9 mg/dL (ref 8.9–10.3)
Creatinine, Ser: 0.67 mg/dL (ref 0.61–1.24)
GFR calc Af Amer: >60 mL/min (ref >60)
GFR calc non Af Amer: >60 mL/min (ref >60)
GLUCOSE: 91 mg/dL (ref 65–99)
POTASSIUM: 3.9 mmol/L (ref 3.5–5.1)
Sodium: 139 mmol/L (ref 135–145)

## 2014-10-23 LAB — CBC
HEMATOCRIT: 41 % (ref 40.0–52.0)
Hemoglobin: 14.4 g/dL (ref 13.0–18.0)
MCH: 34.5 pg — ABNORMAL HIGH (ref 26.0–34.0)
MCHC: 35.2 g/dL (ref 32.0–36.0)
MCV: 98.1 fL (ref 80.0–100.0)
Platelets: 98 10*3/uL — ABNORMAL LOW (ref 150–440)
RBC: 4.18 MIL/uL — ABNORMAL LOW (ref 4.40–5.90)
RDW: 13.6 % (ref 11.5–14.5)
WBC: 8.4 10*3/uL (ref 3.8–10.6)

## 2014-10-23 LAB — SURGICAL PCR SCREEN
MRSA, PCR: NEGATIVE
Staphylococcus aureus: NEGATIVE

## 2014-10-23 NOTE — Patient Instructions (Signed)
  Your procedure is scheduled on: October 30, 2014(Wednesday) Report to Day Surgery.[facility]) To find out your arrival time please call [phone] between 1PM - 3PM on October 29, 2014(Tuesday).  Remember: Instructions that are not followed completely may result in serious medical risk, up to and including death, or upon the discretion of your surgeon and anesthesiologist your surgery may need to be rescheduled.    __x__ 1. Do not eat food or drink liquids after midnight. No gum chewing or hard candies.     ____ 2. No Alcohol for 24 hours before or after surgery.   ____ 3. Bring all medications with you on the day of surgery if instructed.    ___x_ 4. Notify your doctor if there is any change in your medical condition     (cold, fever, infections).     Do not wear jewelry, make-up, hairpins, clips or nail polish.  Do not wear lotions, powders, or perfumes. You may wear deodorant.  Do not shave 48 hours prior to surgery. Men may shave face and neck.  Do not bring valuables to the hospital.    [facility] is not responsible for any belongings or valuables.               Contacts, dentures or bridgework may not be worn into surgery.  Leave your suitcase in the car. After surgery it may be brought to your room.  For patients admitted to the hospital, discharge time is determined by your                treatment team.   Patients discharged the day of surgery will not be allowed to drive home.   Please read over the following fact sheets that you were given:   MRSA Information and Surgical Site Infection Prevention   ____ Take these medicines the morning of surgery with A SIP OF WATER:    1. Keppra  2. Nexium  3. Propranolol    ____ Fleet Enema (as directed)   _x___ Use CHG Soap as directed  _x___ Use inhalers on the day of surgery(Use Albuterol inhaler the morning of surgery and bring to hospital)  ____ Stop metformin 2 days prior to surgery    ____ Take 1/2 of usual  insulin dose the night before surgery and none on the morning of surgery.   ____ Stop Coumadin/Plavix/aspirin on   ____ Stop Anti-inflammatories on    __x__ Stop supplements until after surgery.  (Stop Milk Thistle until after surgery, and bring Lactulose to hospital the day of surgery)  ____ Bring C-Pap to the hospital.

## 2014-10-30 ENCOUNTER — Ambulatory Visit: Payer: Medicare Other | Admitting: Anesthesiology

## 2014-10-30 ENCOUNTER — Encounter: Admission: RE | Payer: Self-pay | Source: Ambulatory Visit

## 2014-10-30 ENCOUNTER — Ambulatory Visit: Admission: RE | Admit: 2014-10-30 | Payer: Self-pay | Source: Ambulatory Visit | Admitting: Podiatry

## 2014-10-30 ENCOUNTER — Encounter: Payer: Self-pay | Admitting: *Deleted

## 2014-10-30 ENCOUNTER — Encounter
Admission: RE | Disposition: A | Discharge status: Home / Self Care | Payer: Self-pay | Source: Ambulatory Visit | Attending: Podiatry

## 2014-10-30 ENCOUNTER — Ambulatory Visit
Admission: RE | Admit: 2014-10-30 | Discharge: 2014-10-30 | Disposition: A | Discharge status: Home / Self Care | Payer: Medicare Other | Source: Ambulatory Visit | Attending: Podiatry | Admitting: Podiatry

## 2014-10-30 DIAGNOSIS — I10 Essential (primary) hypertension: Secondary | ICD-10-CM | POA: Insufficient documentation

## 2014-10-30 DIAGNOSIS — Z823 Family history of stroke: Secondary | ICD-10-CM | POA: Insufficient documentation

## 2014-10-30 DIAGNOSIS — G312 Degeneration of nervous system due to alcohol: Secondary | ICD-10-CM | POA: Diagnosis not present

## 2014-10-30 DIAGNOSIS — Z6835 Body mass index (BMI) 35.0-35.9, adult: Secondary | ICD-10-CM | POA: Insufficient documentation

## 2014-10-30 DIAGNOSIS — Z800 Family history of malignant neoplasm of digestive organs: Secondary | ICD-10-CM | POA: Insufficient documentation

## 2014-10-30 DIAGNOSIS — K7030 Alcoholic cirrhosis of liver without ascites: Secondary | ICD-10-CM | POA: Insufficient documentation

## 2014-10-30 DIAGNOSIS — G4730 Sleep apnea, unspecified: Secondary | ICD-10-CM | POA: Diagnosis not present

## 2014-10-30 DIAGNOSIS — L97529 Non-pressure chronic ulcer of other part of left foot with unspecified severity: Secondary | ICD-10-CM | POA: Insufficient documentation

## 2014-10-30 DIAGNOSIS — Z9842 Cataract extraction status, left eye: Secondary | ICD-10-CM | POA: Insufficient documentation

## 2014-10-30 DIAGNOSIS — Z818 Family history of other mental and behavioral disorders: Secondary | ICD-10-CM | POA: Diagnosis not present

## 2014-10-30 DIAGNOSIS — E669 Obesity, unspecified: Secondary | ICD-10-CM | POA: Diagnosis not present

## 2014-10-30 DIAGNOSIS — Z9849 Cataract extraction status, unspecified eye: Secondary | ICD-10-CM | POA: Diagnosis not present

## 2014-10-30 DIAGNOSIS — K766 Portal hypertension: Secondary | ICD-10-CM | POA: Insufficient documentation

## 2014-10-30 DIAGNOSIS — J449 Chronic obstructive pulmonary disease, unspecified: Secondary | ICD-10-CM | POA: Diagnosis not present

## 2014-10-30 DIAGNOSIS — Z811 Family history of alcohol abuse and dependence: Secondary | ICD-10-CM | POA: Insufficient documentation

## 2014-10-30 DIAGNOSIS — Z9109 Other allergy status, other than to drugs and biological substances: Secondary | ICD-10-CM | POA: Diagnosis not present

## 2014-10-30 DIAGNOSIS — M2012 Hallux valgus (acquired), left foot: Secondary | ICD-10-CM | POA: Diagnosis not present

## 2014-10-30 DIAGNOSIS — D6959 Other secondary thrombocytopenia: Secondary | ICD-10-CM | POA: Diagnosis not present

## 2014-10-30 DIAGNOSIS — E785 Hyperlipidemia, unspecified: Secondary | ICD-10-CM | POA: Insufficient documentation

## 2014-10-30 DIAGNOSIS — Z7951 Long term (current) use of inhaled steroids: Secondary | ICD-10-CM | POA: Diagnosis not present

## 2014-10-30 DIAGNOSIS — Z9841 Cataract extraction status, right eye: Secondary | ICD-10-CM | POA: Diagnosis not present

## 2014-10-30 DIAGNOSIS — F329 Major depressive disorder, single episode, unspecified: Secondary | ICD-10-CM | POA: Insufficient documentation

## 2014-10-30 DIAGNOSIS — R739 Hyperglycemia, unspecified: Secondary | ICD-10-CM | POA: Diagnosis not present

## 2014-10-30 DIAGNOSIS — J45909 Unspecified asthma, uncomplicated: Secondary | ICD-10-CM | POA: Insufficient documentation

## 2014-10-30 DIAGNOSIS — F17210 Nicotine dependence, cigarettes, uncomplicated: Secondary | ICD-10-CM | POA: Diagnosis not present

## 2014-10-30 DIAGNOSIS — H02403 Unspecified ptosis of bilateral eyelids: Secondary | ICD-10-CM | POA: Insufficient documentation

## 2014-10-30 DIAGNOSIS — Z79899 Other long term (current) drug therapy: Secondary | ICD-10-CM | POA: Insufficient documentation

## 2014-10-30 DIAGNOSIS — K589 Irritable bowel syndrome without diarrhea: Secondary | ICD-10-CM | POA: Diagnosis not present

## 2014-10-30 DIAGNOSIS — H35381 Toxic maculopathy, right eye: Secondary | ICD-10-CM | POA: Insufficient documentation

## 2014-10-30 DIAGNOSIS — I719 Aortic aneurysm of unspecified site, without rupture: Secondary | ICD-10-CM | POA: Insufficient documentation

## 2014-10-30 DIAGNOSIS — R569 Unspecified convulsions: Secondary | ICD-10-CM | POA: Insufficient documentation

## 2014-10-30 DIAGNOSIS — Z8249 Family history of ischemic heart disease and other diseases of the circulatory system: Secondary | ICD-10-CM | POA: Diagnosis not present

## 2014-10-30 HISTORY — DX: Unspecified ptosis of bilateral eyelids: H02.403

## 2014-10-30 HISTORY — DX: Carrier or suspected carrier of methicillin resistant Staphylococcus aureus: Z22.322

## 2014-10-30 HISTORY — DX: Other allergy status, other than to drugs and biological substances: Z91.09

## 2014-10-30 HISTORY — DX: Localization-related (focal) (partial) symptomatic epilepsy and epileptic syndromes with complex partial seizures, not intractable, without status epilepticus: G40.209

## 2014-10-30 HISTORY — DX: Alcoholic cirrhosis of liver without ascites: K70.30

## 2014-10-30 HISTORY — DX: Hyperlipidemia, unspecified: E78.5

## 2014-10-30 HISTORY — DX: Depression, unspecified: F32.A

## 2014-10-30 HISTORY — DX: Alcoholic polyneuropathy: G62.1

## 2014-10-30 HISTORY — DX: Hepatic encephalopathy: K76.82

## 2014-10-30 HISTORY — DX: Toxic maculopathy, right eye: H35.381

## 2014-10-30 HISTORY — DX: Alcohol abuse, uncomplicated: F10.10

## 2014-10-30 HISTORY — DX: Chronic obstructive pulmonary disease, unspecified: J44.9

## 2014-10-30 HISTORY — DX: Presence of intraocular lens: Z96.1

## 2014-10-30 HISTORY — DX: Portal hypertension: K76.6

## 2014-10-30 HISTORY — DX: Obesity, unspecified: E66.9

## 2014-10-30 HISTORY — DX: Non-pressure chronic ulcer of unspecified part of unspecified lower leg with unspecified severity: L97.909

## 2014-10-30 HISTORY — DX: Other secondary thrombocytopenia: D69.59

## 2014-10-30 HISTORY — DX: Tobacco use: Z72.0

## 2014-10-30 HISTORY — DX: Presence of other vascular implants and grafts: Z95.828

## 2014-10-30 HISTORY — DX: Major depressive disorder, single episode, unspecified: F32.9

## 2014-10-30 HISTORY — DX: Unspecified retinal disorder: H35.9

## 2014-10-30 HISTORY — DX: Aortic aneurysm of unspecified site, without rupture: I71.9

## 2014-10-30 HISTORY — DX: Hepatic failure, unspecified without coma: K72.90

## 2014-10-30 HISTORY — DX: Essential (primary) hypertension: I10

## 2014-10-30 HISTORY — DX: Male erectile dysfunction, unspecified: N52.9

## 2014-10-30 HISTORY — PX: HALLUX VALGUS AUSTIN: SHX6623

## 2014-10-30 SURGERY — CORRECTION, HALLUX VALGUS
Anesthesia: General | Site: Foot | Laterality: Left | Wound class: Clean

## 2014-10-30 SURGERY — OSTEOTOMY, METATARSAL BONE
Anesthesia: Regional | Laterality: Left

## 2014-10-30 MED ORDER — CEFAZOLIN SODIUM-DEXTROSE 2-3 GM-% IV SOLR
2.0000 g | Freq: Once | INTRAVENOUS | Status: AC
  Administered 2014-10-30: 2 g via INTRAVENOUS

## 2014-10-30 MED ORDER — LIDOCAINE HCL (CARDIAC) 20 MG/ML IV SOLN
INTRAVENOUS | Status: DC | PRN
  Administered 2014-10-30: 40 mg via INTRAVENOUS

## 2014-10-30 MED ORDER — LIDOCAINE HCL (PF) 1 % IJ SOLN
INTRAMUSCULAR | Status: DC | PRN
  Administered 2014-10-30: 5 mL

## 2014-10-30 MED ORDER — ONDANSETRON HCL 4 MG/2ML IJ SOLN: 4.0000 mg | Freq: Once | INTRAMUSCULAR | Status: DC | PRN

## 2014-10-30 MED ORDER — MIDAZOLAM HCL 2 MG/2ML IJ SOLN
INTRAMUSCULAR | Status: DC | PRN
  Administered 2014-10-30: 2 mg via INTRAVENOUS

## 2014-10-30 MED ORDER — CEFAZOLIN SODIUM-DEXTROSE 2-3 GM-% IV SOLR
INTRAVENOUS | Status: AC
  Filled 2014-10-30: qty 50

## 2014-10-30 MED ORDER — FENTANYL CITRATE (PF) 100 MCG/2ML IJ SOLN
INTRAMUSCULAR | Status: DC | PRN
  Administered 2014-10-30 (×2): 50 ug via INTRAVENOUS

## 2014-10-30 MED ORDER — BUPIVACAINE HCL (PF) 0.5 % IJ SOLN
INTRAMUSCULAR | Status: AC
  Filled 2014-10-30: qty 30

## 2014-10-30 MED ORDER — LIDOCAINE HCL (PF) 1 % IJ SOLN
INTRAMUSCULAR | Status: AC
  Filled 2014-10-30: qty 30

## 2014-10-30 MED ORDER — ONDANSETRON HCL 4 MG/2ML IJ SOLN
INTRAMUSCULAR | Status: DC | PRN
  Administered 2014-10-30: 4 mg via INTRAVENOUS

## 2014-10-30 MED ORDER — BUPIVACAINE HCL 0.5 % IJ SOLN
INTRAMUSCULAR | Status: DC | PRN
  Administered 2014-10-30: 12 mL

## 2014-10-30 MED ORDER — PROPOFOL 10 MG/ML IV BOLUS
INTRAVENOUS | Status: DC | PRN
  Administered 2014-10-30: 200 mg via INTRAVENOUS

## 2014-10-30 MED ORDER — OXYCODONE-ACETAMINOPHEN 7.5-325 MG PO TABS
1.0000 | ORAL_TABLET | ORAL | Status: DC | PRN
  Administered 2014-10-30: 1 via ORAL

## 2014-10-30 MED ORDER — OXYCODONE-ACETAMINOPHEN 7.5-325 MG PO TABS: 1.0000 | ORAL_TABLET | ORAL | Status: DC | PRN

## 2014-10-30 MED ORDER — OXYCODONE-ACETAMINOPHEN 7.5-325 MG PO TABS
ORAL_TABLET | ORAL | Status: AC
  Filled 2014-10-30: qty 1

## 2014-10-30 MED ORDER — BUPIVACAINE HCL (PF) 0.5 % IJ SOLN
INTRAMUSCULAR | Status: DC | PRN
  Administered 2014-10-30: 5 mL

## 2014-10-30 MED ORDER — SODIUM CHLORIDE 0.9 % IV SOLN
INTRAVENOUS | Status: DC
  Administered 2014-10-30 (×2): via INTRAVENOUS

## 2014-10-30 MED ORDER — GLYCOPYRROLATE 0.2 MG/ML IJ SOLN
INTRAMUSCULAR | Status: DC | PRN
  Administered 2014-10-30: .2 mg via INTRAVENOUS

## 2014-10-30 MED ORDER — FENTANYL CITRATE (PF) 100 MCG/2ML IJ SOLN: 25.0000 ug | INTRAMUSCULAR | Status: DC | PRN

## 2014-10-30 SURGICAL SUPPLY — 41 items
BANDAGE ELASTIC 4 CLIP NS LF (GAUZE/BANDAGES/DRESSINGS) IMPLANT
BANDAGE STRETCH 3X4.1 STRL (GAUZE/BANDAGES/DRESSINGS) IMPLANT
BLADE MED AGGRESSIVE (BLADE) ×2 IMPLANT
BLADE OSC/SAGITTAL MD 5.5X18 (BLADE) ×2 IMPLANT
BLADE OSC/SAGITTAL MD 9X18.5 (BLADE) IMPLANT
BLADE OSCILLATING/SAGITTAL (BLADE)
BLADE SURG 15 STRL LF DISP TIS (BLADE) ×4 IMPLANT
BLADE SURG 15 STRL SS (BLADE) ×4
BLADE SURG MINI STRL (BLADE) IMPLANT
BLADE SW THK.38XMED LNG THN (BLADE) IMPLANT
BNDG ESMARK 4X12 TAN STRL LF (GAUZE/BANDAGES/DRESSINGS) IMPLANT
BNDG GAUZE 4.5X4.1 6PLY STRL (MISCELLANEOUS) ×2 IMPLANT
CANISTER SUCT 1200ML W/VALVE (MISCELLANEOUS) ×2 IMPLANT
CUFF TOURN 18 STER (MISCELLANEOUS) ×2 IMPLANT
DRAPE FLUOR MINI C-ARM 54X84 (DRAPES) ×2 IMPLANT
DURAPREP 26ML APPLICATOR (WOUND CARE) ×2 IMPLANT
GAUZE PETRO XEROFOAM 1X8 (MISCELLANEOUS) IMPLANT
GLOVE BIO SURGEON STRL SZ7.5 (GLOVE) ×2 IMPLANT
GLOVE INDICATOR 8.0 STRL GRN (GLOVE) ×2 IMPLANT
GOWN STRL REUS W/ TWL LRG LVL3 (GOWN DISPOSABLE) ×2 IMPLANT
GOWN STRL REUS W/TWL LRG LVL3 (GOWN DISPOSABLE) ×2
KIT RM TURNOVER STRD PROC AR (KITS) ×2 IMPLANT
LABEL OR SOLS (LABEL) ×2 IMPLANT
NEEDLE HYPO 25X1 1.5 SAFETY (NEEDLE) ×4 IMPLANT
NS IRRIG 500ML POUR BTL (IV SOLUTION) ×2 IMPLANT
PACK EXTREMITY ARMC (MISCELLANEOUS) ×2 IMPLANT
PAD GROUND ADULT SPLIT (MISCELLANEOUS) ×2 IMPLANT
PENCIL ELECTRO HAND CTR (MISCELLANEOUS) ×2 IMPLANT
RASP SM TEAR CROSS CUT (RASP) ×2 IMPLANT
SCREW CORTEX 2.4 26MM (Screw) ×2 IMPLANT
SPLINT CAST 1 STEP 5X30 WHT (MISCELLANEOUS) IMPLANT
STOCKINETTE STRL 6IN 960660 (GAUZE/BANDAGES/DRESSINGS) IMPLANT
STRIP CLOSURE SKIN 1/4X4 (GAUZE/BANDAGES/DRESSINGS) IMPLANT
SUT ETHILON 5-0 FS-2 18 BLK (SUTURE) ×2 IMPLANT
SUT VIC AB 3-0 SH 27 (SUTURE) ×1
SUT VIC AB 3-0 SH 27X BRD (SUTURE) ×1 IMPLANT
SUT VIC AB 4-0 FS2 27 (SUTURE) ×2 IMPLANT
SWABSTK COMLB BENZOIN TINCTURE (MISCELLANEOUS) ×2 IMPLANT
SYRINGE 10CC LL (SYRINGE) ×2 IMPLANT
WIRE Z .045 C-WIRE SPADE TIP (WIRE) ×2 IMPLANT
WIRE Z .062 C-WIRE SPADE TIP (WIRE) ×2 IMPLANT

## 2014-10-30 NOTE — Anesthesia Postprocedure Evaluation (Signed)
  Anesthesia Post-op Note  Patient: Adrian Hale  Procedure(s) Performed: Procedure(s): HALLUX VALGUS AUSTIN (Left)  Anesthesia type:General  Patient location: PACU  Post pain: Pain level controlled  Post assessment: Post-op Vital signs reviewed, Patient's Cardiovascular Status Stable, Respiratory Function Stable, Patent Airway and No signs of Nausea or vomiting  Post vital signs: Reviewed and stable  Last Vitals:  Filed Vitals:   10/30/14 1620  BP: 120/80  Pulse: 56  Temp:   Resp: 15    Level of consciousness: awake, alert  and patient cooperative  Complications: No apparent anesthesia complications

## 2014-10-30 NOTE — Op Note (Signed)
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  Excision interphalangeal joint toe left great toe medially.    EBL: Minimal    Anesthesia:local and general    Hemostasis: Ankle tourniquet inflated to 250 mmHg for proximal 75 minutes    Specimen: None    Complications: None    Operative indications: 54 year old gentleman with a painful hallux valgus deformity who has developed an ulcer to his interphalangeal joint of his left great toe. He is undergone long-standing conservative treatment and presents today for surgery. The risks benefits alternatives and competitions associated were discussed with the patient in for an consent has been given    Procedure:  Patient was brought into the OR and placed on the operating table in thesupine position. After anesthesia was obtained theleft lower extremity was prepped and draped in usual sterile fashion.  Attention was directed to the dorsal medial first MTPJ where a longitudinal incision was made from the mid shaft of the first metatarsal to the interphalangeal joint of the left great toe. Sharp and blunt dissection carried down to the capsule. A T capsulotomy was performed. The dorsomedial eminence was noted and transected with a power saw. Next a V osteotomy was created. The capital fragment was translocated laterally and stabilized with a 0.062 K wire. The ensuing overhanging ledge was transected with a power saw and smoothed with a power rasp. Residual hallux valgus deformity was noted at the axle phalangeal level. Attention was directed to the the ulcerative site was noted at the level of the interphalangeal joint and a prominent condyle was noted at this well. Removed with a power saw and smoothed with a power rasp. Good action of this prominence was noted with this. At this  time an Akin osteotomy was performed. The apex was proximal lateral. The small wedge was removed. This was temporarily stabilized with a 0.062 K wire. Compression was held across the osteotomy site and a 2.4 mm x 26 mm screw was placed across the osteotomy site with good stability noted. Good realignment of the first MTPJ and great toe was noted. All wounds were then flushed with copious varus or irrigation. Layered closure was performed with a 3-0 Vicryl for the deeper and capsule layer. 4-0 Vicryl the subcutaneous tissue. 5-0 nylon for skin. 0.5% Marcaine was placed from all areas. A well compressive sterile dressing was in place. Patient follow the procedure and anesthesia well. I will see him in outpatient clinic in 5-7 days.    Patient tolerated the procedure and anesthesia well.  Was transported from the OR to the PACU with all vital signs stable and vascular status intact. To be discharged per routine protocol.  Will follow up in approximately 1 week in the outpatient clinic.

## 2014-10-30 NOTE — Anesthesia Procedure Notes (Signed)
Procedure Name: LMA Insertion Date/Time: 10/30/2014 1:25 PM Performed by: Jamiah Recore Pre-anesthesia Checklist: Patient identified, Emergency Drugs available, Suction available, Patient being monitored and Timeout performed Patient Re-evaluated:Patient Re-evaluated prior to inductionOxygen Delivery Method: Circle system utilized Preoxygenation: Pre-oxygenation with 100% oxygen Intubation Type: IV induction Ventilation: Mask ventilation without difficulty LMA: LMA inserted LMA Size: 5.0 Number of attempts: 1 Placement Confirmation: positive ETCO2 and breath sounds checked- equal and bilateral Tube secured with: Tape Dental Injury: Teeth and Oropharynx as per pre-operative assessment

## 2014-10-30 NOTE — OR Nursing (Signed)
Wedge shoe given

## 2014-10-30 NOTE — Anesthesia Preprocedure Evaluation (Signed)
Anesthesia Evaluation    Airway Mallampati: II       Dental  (+) Edentulous Lower, Edentulous Upper   Pulmonary sleep apnea , COPD, Current Smoker,           Cardiovascular hypertension, Pt. on medications + Peripheral Vascular Disease       Neuro/Psych Seizures -,  PSYCHIATRIC DISORDERS Anxiety Depression  Neuromuscular disease    GI/Hepatic   Endo/Other    Renal/GU      Musculoskeletal   Abdominal   Peds  Hematology   Anesthesia Other Findings Last seizure 1 yr ago. Severe peripheral neuropathy. Echo ok. Plts 98000.  Reproductive/Obstetrics                             Anesthesia Physical Anesthesia Plan  ASA: III  Anesthesia Plan: General   Post-op Pain Management:    Induction: Intravenous  Airway Management Planned: LMA  Additional Equipment:   Intra-op Plan:   Post-operative Plan:   Informed Consent: I have reviewed the patients History and Physical, chart, labs and discussed the procedure including the risks, benefits and alternatives for the proposed anesthesia with the patient or authorized representative who has indicated his/her understanding and acceptance.     Plan Discussed with: CRNA  Anesthesia Plan Comments:         Anesthesia Quick Evaluation

## 2014-10-30 NOTE — Transfer of Care (Signed)
Immediate Anesthesia Transfer of Care Note  Patient: Adrian Hale  Procedure(s) Performed: Procedure(s): HALLUX VALGUS AUSTIN (Left)  Patient Location: PACU  Anesthesia Type:General  Level of Consciousness: sedated  Airway & Oxygen Therapy: Patient Spontanous Breathing and Patient connected to face mask oxygen  Post-op Assessment: Report given to RN  Post vital signs: Reviewed and stable  Last Vitals:  Filed Vitals:   10/30/14 1510  BP: 125/66  Pulse: 59  Temp: 36.4 C  Resp: 11    Complications: No apparent anesthesia complications

## 2014-10-30 NOTE — Discharge Instructions (Signed)
[place] [address] [place]   1. Take your medication as prescribed.  Pain medication should be taken only as needed.  2. Keep the dressing clean, dry and intact.  3. Keep your foot elevated above the heart level for the first 48 hours.  4. Walking to the bathroom and brief periods of walking are acceptable, unless we have instructed you to be non-weight bearing.  5. Always wear your post-op shoe when walking.  Always use your crutches if you are to be non-weight bearing.  6. Do not take a shower. Baths are permissible as long as the foot is kept out of the water.   7. Every hour you are awake:  - Bend your knee 15 times. - Flex foot 15 times - Massage calf 15 times  8. Call [facility] [phone]) if any of the following problems occur: - You develop a temperature or fever. - The bandage becomes saturated with blood. - Medication does not stop your pain. - Injury of the foot occurs. - Any symptoms of infection including redness, odor, or red streaks running from wound. AMBULATORY SURGERY  DISCHARGE INSTRUCTIONS   The drugs that you were given will stay in your system until tomorrow so for the next 24 hours you should not:  Drive an automobile Make any legal decisions Drink any alcoholic beverage   You may resume regular meals tomorrow.  Today it is better to start with liquids and gradually work up to solid foods.  You may eat anything you prefer, but it is better to start with liquids, then soup and crackers, and gradually work up to solid foods.   Please notify your doctor immediately if you have any unusual bleeding, trouble breathing, redness and pain at the surgery site, drainage, fever, or pain not relieved by medication.    Additional Instructions:        Please contact your physician with any problems or Same Day Surgery  at [phone], Monday through Friday 6 am to 4 pm, or [place] at [facility] number at [phone].

## 2014-10-30 NOTE — H&P (Signed)
  HISTORY AND PHYSICAL INTERVAL NOTE:  10/30/2014  1:04 PM  Adrian Hale  has presented today for surgery, with the diagnosis of hallux valgus.  The various methods of treatment have been discussed with the patient.  No guarantees were given.  After consideration of risks, benefits and other options for treatment, the patient has consented to surgery.  I have reviewed the patients' chart and labs.    Patient Vitals for the past 24 hrs:  BP Temp Temp src Pulse Resp SpO2 Height Weight  10/30/14 1242 (!) 152/91 mmHg 98.4 F (36.9 C) Oral (!) 53 18 97 % 5\' 11"  (1.803 m) 115.214 kg (254 lb)    A history and physical examination was performed in my office.  The patient was reexamined.  There have been no changes to this history and physical examination.Pt has been cleared by cardiology.  Samara Deist A

## 2014-10-31 ENCOUNTER — Encounter: Payer: Self-pay | Admitting: Podiatry

## 2017-03-16 ENCOUNTER — Other Ambulatory Visit: Payer: Self-pay | Admitting: Specialist

## 2017-03-16 DIAGNOSIS — R0602 Shortness of breath: Secondary | ICD-10-CM

## 2017-03-16 DIAGNOSIS — R9389 Abnormal findings on diagnostic imaging of other specified body structures: Secondary | ICD-10-CM

## 2017-03-16 DIAGNOSIS — J449 Chronic obstructive pulmonary disease, unspecified: Secondary | ICD-10-CM

## 2017-07-25 ENCOUNTER — Other Ambulatory Visit: Payer: Self-pay | Admitting: Nurse Practitioner

## 2017-07-25 DIAGNOSIS — R569 Unspecified convulsions: Secondary | ICD-10-CM

## 2017-08-08 ENCOUNTER — Ambulatory Visit
Admission: RE | Admit: 2017-08-08 | Discharge: 2017-08-08 | Disposition: A | Discharge status: Home / Self Care | Payer: Medicare Other | Source: Ambulatory Visit | Attending: Nurse Practitioner | Admitting: Nurse Practitioner

## 2017-08-08 DIAGNOSIS — R569 Unspecified convulsions: Secondary | ICD-10-CM

## 2017-08-08 DIAGNOSIS — I739 Peripheral vascular disease, unspecified: Secondary | ICD-10-CM | POA: Diagnosis not present

## 2017-08-08 DIAGNOSIS — G319 Degenerative disease of nervous system, unspecified: Secondary | ICD-10-CM | POA: Insufficient documentation

## 2017-08-08 DIAGNOSIS — J329 Chronic sinusitis, unspecified: Secondary | ICD-10-CM | POA: Diagnosis not present

## 2017-08-08 LAB — POCT I-STAT CREATININE: Creatinine, Ser: 0.7 mg/dL (ref 0.61–1.24)

## 2017-08-08 MED ORDER — GADOBENATE DIMEGLUMINE 529 MG/ML IV SOLN
20.0000 mL | Freq: Once | INTRAVENOUS | Status: AC | PRN
  Administered 2017-08-08: 20 mL via INTRAVENOUS

## 2018-04-06 ENCOUNTER — Emergency Department: Payer: Medicare Other

## 2018-04-06 ENCOUNTER — Other Ambulatory Visit: Payer: Self-pay

## 2018-04-06 ENCOUNTER — Emergency Department
Admission: EM | Admit: 2018-04-06 | Discharge: 2018-04-06 | Disposition: A | Discharge status: Home / Self Care | Payer: Medicare Other | Attending: Student in an Organized Health Care Education/Training Program | Admitting: Student in an Organized Health Care Education/Training Program

## 2018-04-06 ENCOUNTER — Encounter: Payer: Self-pay | Admitting: *Deleted

## 2018-04-06 DIAGNOSIS — W312XXA Contact with powered woodworking and forming machines, initial encounter: Secondary | ICD-10-CM | POA: Diagnosis not present

## 2018-04-06 DIAGNOSIS — I10 Essential (primary) hypertension: Secondary | ICD-10-CM | POA: Insufficient documentation

## 2018-04-06 DIAGNOSIS — F17210 Nicotine dependence, cigarettes, uncomplicated: Secondary | ICD-10-CM | POA: Diagnosis not present

## 2018-04-06 DIAGNOSIS — Y999 Unspecified external cause status: Secondary | ICD-10-CM | POA: Insufficient documentation

## 2018-04-06 DIAGNOSIS — Y929 Unspecified place or not applicable: Secondary | ICD-10-CM | POA: Insufficient documentation

## 2018-04-06 DIAGNOSIS — J449 Chronic obstructive pulmonary disease, unspecified: Secondary | ICD-10-CM | POA: Diagnosis not present

## 2018-04-06 DIAGNOSIS — S61313A Laceration without foreign body of left middle finger with damage to nail, initial encounter: Secondary | ICD-10-CM

## 2018-04-06 DIAGNOSIS — Y93H3 Activity, building and construction: Secondary | ICD-10-CM | POA: Diagnosis not present

## 2018-04-06 DIAGNOSIS — S61012A Laceration without foreign body of left thumb without damage to nail, initial encounter: Secondary | ICD-10-CM | POA: Diagnosis not present

## 2018-04-06 DIAGNOSIS — S6992XA Unspecified injury of left wrist, hand and finger(s), initial encounter: Secondary | ICD-10-CM | POA: Diagnosis present

## 2018-04-06 DIAGNOSIS — Z79899 Other long term (current) drug therapy: Secondary | ICD-10-CM | POA: Insufficient documentation

## 2018-04-06 DIAGNOSIS — S61213A Laceration without foreign body of left middle finger without damage to nail, initial encounter: Secondary | ICD-10-CM | POA: Insufficient documentation

## 2018-04-06 MED ORDER — LIDOCAINE HCL (PF) 1 % IJ SOLN
5.0000 mL | Freq: Once | INTRAMUSCULAR | Status: AC
Start: 2018-04-06 — End: 2018-04-06
  Administered 2018-04-06: 5 mL via INTRADERMAL
  Filled 2018-04-06: qty 5

## 2018-04-06 MED ORDER — HYDROCODONE-ACETAMINOPHEN 5-325 MG PO TABS: 1.0000 | ORAL_TABLET | ORAL | 0 refills | Status: DC | PRN

## 2018-04-06 MED ORDER — OXYCODONE-ACETAMINOPHEN 5-325 MG PO TABS
1.0000 | ORAL_TABLET | Freq: Once | ORAL | Status: AC
  Administered 2018-04-06: 1 via ORAL
  Filled 2018-04-06: qty 1

## 2018-04-06 MED ORDER — OXYCODONE HCL 5 MG PO CAPS: 5.0000 mg | ORAL_CAPSULE | Freq: Four times a day (QID) | ORAL | 0 refills | Status: AC | PRN

## 2018-04-06 MED ORDER — CEPHALEXIN 500 MG PO CAPS: 500.0000 mg | ORAL_CAPSULE | Freq: Four times a day (QID) | ORAL | 0 refills | Status: AC

## 2018-04-06 NOTE — ED Notes (Signed)
See triage note  Presents with laceration left hand from table saw  Avulsion noted to middle finger and laceration to left thumb

## 2018-04-06 NOTE — ED Triage Notes (Signed)
Pt has laceration to left 3rd finger and left thumb.  Pt cut fingers on a table saw today. Bleeding controlled.

## 2018-04-06 NOTE — ED Provider Notes (Signed)
[facility] Emergency Department Provider Note  ____________________________________________  Time seen: Approximately 4:39 PM  I have reviewed the triage vital signs and the nursing notes.   HISTORY  Chief Complaint Laceration    HPI Adrian Hale is a 58 y.o. male that presents to the emergency department for evaluation of tablesaw injury.  Patient states that he cut his left middle and finger and thumb on a table saw.  Last tetanus shot was 3 to 4 years ago. No numbness, tingling.   Past Medical History:  Diagnosis Date  . Alcohol abuse    NONE SINCE 08/28/2011 AND ATTENDING AA MEETINGS  . Alcoholic cirrhosis of liver ([place])   . Alcoholic peripheral neuropathy ([place])   . Allergy to poison ivy 06/06/2013   RASH  . Anxiety   . Aortic aneurysm ([place])   . Cellulitis of both lower extremities   . Complex partial seizure evolving to generalized seizure ([place])   . COPD (chronic obstructive pulmonary disease) ([place])   . Depression    MAJOR DEPRESSIVE DISORDER  . ED (erectile dysfunction)   . Hepatic encephalopathy ([place])   . Hyperlipidemia   . Hypertension    ESSENTIAL  . Maculopathy   . MRSA (methicillin resistant staph aureus) culture positive   . Obesity   . Portal hypertension ([place])   . Pseudophakia of both eyes   . Ptosis of both eyelids   . S/P TIPS (transjugular intrahepatic portosystemic shunt)   . Seizures ([place])    Last seizure April 06, 2013, [facility]  . Sleep apnea    Does not use CPAP  . Thrombocytopenia, secondary   . Tobacco abuse   . Toxic maculopathy of right eye   . Ulcer of lower limb ([place])    ABSCESS OR CELLULITIS OF LEG    There are no active problems to display for this patient.   Past Surgical History:  Procedure Laterality Date  . BREAST SURGERY     BIOPSY  . CATARACT EXTRACTION Right 10/19/2011  . CATARACT EXTRACTION Left 02/22/2012  . COLONOSCOPY    . ESOPHAGOGASTRODUODENOSCOPY    . EYE SURGERY    .  [place] Left 10/30/2014   Procedure: HALLUX VALGUS AUSTIN;  Surgeon: Samara Deist, DPM;  Location: [facility] ORS;  Service: Podiatry;  Laterality: Left;  . LIVER SURGERY     TIPS PROCEDURE  . TIPS PROCEDURE      Prior to Admission medications   Medication Sig Start Date End Date Taking? Authorizing Provider  acetaminophen (TYLENOL) 325 MG tablet Take 650 mg by mouth every 4 (four) hours as needed.    [provider]  albuterol (PROVENTIL HFA;VENTOLIN HFA) 108 (90 BASE) MCG/ACT inhaler Inhale 2 puffs into the lungs every 4 (four) hours as needed for wheezing or shortness of breath.    [provider]  albuterol (PROVENTIL) (5 MG/ML) 0.5% nebulizer solution Take 2.5 mg by nebulization every 4 (four) hours as needed for wheezing or shortness of breath.    [provider]  cephALEXin (KEFLEX) 500 MG capsule Take 1 capsule (500 mg total) by mouth 4 (four) times daily for 10 days. 04/06/18 04/16/18  Laban Emperor, PA-C  esomeprazole (NEXIUM) 40 MG capsule Take 40 mg by mouth daily.     [provider]  fluticasone (FLONASE) 50 MCG/ACT nasal spray Place 1 spray into both nostrils daily.    [provider]  fluticasone (FLOVENT HFA) 110 MCG/ACT inhaler Inhale 1 puff into the lungs 2 (  two) times daily.    [provider]  furosemide (LASIX) 20 MG tablet Take 20 mg by mouth 2 (two) times daily.    [provider]  ketoconazole (NIZORAL) 2 % cream Apply 1 application topically 2 (two) times daily as needed.     [provider]  lactulose, encephalopathy, (ENULOSE) 10 GM/15ML SOLN Take by mouth 2 (two) times daily.     [provider]  levETIRAcetam (KEPPRA XR) 500 MG 24 hr tablet Take 500 mg by mouth 2 (two) times daily.    [provider]  MILK THISTLE PO Take 1 capsule by mouth at bedtime.     [provider]  oxycodone (OXY-IR) 5 MG capsule Take 1 capsule (5 mg total) by mouth every 6 (six) hours as  needed for up to 2 days. 04/06/18 04/08/18  Laban Emperor, PA-C  oxyCODONE-acetaminophen (PERCOCET) 7.5-325 MG tablet Take 1 tablet by mouth every 4 (four) hours as needed for severe pain. 10/30/14   Samara Deist, DPM  propranolol (INDERAL) 20 MG tablet Take 20 mg by mouth daily.    [provider]  rifaximin (XIFAXAN) 550 MG TABS tablet Take 550 mg by mouth 2 (two) times daily.    [provider]  spironolactone (ALDACTONE) 50 MG tablet Take 50 mg by mouth 2 (two) times daily.    [provider]  tadalafil (CIALIS) 20 MG tablet Take 20 mg by mouth daily as needed for erectile dysfunction.    [provider]  tizanidine (ZANAFLEX) 2 MG capsule Take 2 mg by mouth 3 (three) times daily as needed for muscle spasms.    [provider]  traZODone (DESYREL) 100 MG tablet Take 300 mg by mouth at bedtime. PATIENT TAKES 3 TABLETS    [provider]    Allergies Poison ivy extract [poison ivy extract]  No family history on file.  Social History Social History   Tobacco Use  . Smoking status: Current Every Day Smoker    Packs/day: 0.50    Years: 15.00    Pack years: 7.50    Types: Cigarettes  . Smokeless tobacco: Never Used  Substance Use Topics  . Alcohol use: No    Comment: LAST DRINK 09/03/2012  . Drug use: No     Review of Systems  Gastrointestinal: No nausea, no vomiting.  Musculoskeletal: Positive for hand pain Skin: Negative for rash, ecchymosis.  Positive for laceration and avulsion. Neurological: Negative for \\numbness  or tingling   ____________________________________________   PHYSICAL EXAM:  VITAL SIGNS: ED Triage Vitals  Enc Vitals Group     BP 04/06/18 1539 (!) 145/79     Pulse Rate 04/06/18 1539 (!) 55     Resp 04/06/18 1539 20     Temp 04/06/18 1539 97.9 F (36.6 C)     Temp Source 04/06/18 1539 Oral     SpO2 04/06/18 1539 99 %     Weight 04/06/18 1540 210 lb (95.3 kg)     Height 04/06/18 1540 5\' 11"   (1.803 m)     Head Circumference --      Peak Flow --      Pain Score 04/06/18 1539 10     Pain Loc --      Pain Edu? --      Excl. in [place]? --      Constitutional: Alert and oriented. Well appearing and in no acute distress. Eyes: Conjunctivae are normal. PERRL. EOMI. Head: Atraumatic. ENT:      Ears:  Nose: No congestion/rhinnorhea.      Mouth/Throat: Mucous membranes are moist.  Neck: No stridor. Cardiovascular: Normal rate, regular rhythm.  Good peripheral circulation. Respiratory: Normal respiratory effort without tachypnea or retractions. Lungs CTAB. Good air entry to the bases with no decreased or absent breath sounds. Musculoskeletal: Full range of motion to all extremities. No gross deformities appreciated. Neurologic:  Normal speech and language. No gross focal neurologic deficits are appreciated.  Skin:  Skin is warm, dry.  1 cm laceration to volar left thumb.  Avulsion to left middle finger that extends into the tip of the nail. Psychiatric: Mood and affect are normal. Speech and behavior are normal. Patient exhibits appropriate insight and judgement.   ____________________________________________   LABS (all labs ordered are listed, but only abnormal results are displayed)  Labs Reviewed - No data to display ____________________________________________  EKG   ____________________________________________  RADIOLOGY Robinette Haines, personally viewed and evaluated these images (plain radiographs) as part of my medical decision making, as well as reviewing the written report by the radiologist.  Dg Hand Complete Left  Result Date: 04/06/2018 CLINICAL DATA:  Laceration of the third finger and thumb EXAM: LEFT HAND - COMPLETE 3+ VIEW COMPARISON:  None. FINDINGS: No acute fracture or dislocation. No aggressive osseous lesion. No radiopaque foreign body. IMPRESSION: No acute osseous injury of the left hand. Electronically Signed   By: Kathreen Devoid   On: 04/06/2018  16:18    ____________________________________________    PROCEDURES  Procedure(s) performed:    Procedures  LACERATION REPAIR Performed by: Laban Emperor  Consent: Verbal consent obtained.  Consent given by: patient  Prepped and Draped in normal sterile fashion  Wound explored: No foreign bodies   Laceration Location: thumb  Laceration Length: 1 cm  Anesthesia: None  Local anesthetic: lidocaine 1% without epinephrine  Anesthetic total: 3 ml  Irrigation method: syringe  Amount of cleaning: 573ml normal saline  Skin closure: 4-0 nylon  Number of sutures: 3  Technique: Simple interrupted  Patient tolerance: Patient tolerated the procedure well with no immediate complications.  Medications  lidocaine (PF) (XYLOCAINE) 1 % injection 5 mL (5 mLs Intradermal Given by Other 04/06/18 1637)  oxyCODONE-acetaminophen (PERCOCET/ROXICET) 5-325 MG per tablet 1 tablet (1 tablet Oral Given 04/06/18 1606)     ____________________________________________   INITIAL IMPRESSION / ASSESSMENT AND PLAN / ED COURSE  Pertinent labs & imaging results that were available during my care of the patient were reviewed by me and considered in my medical decision making (see chart for details).  Review of the [place] CSRS was performed in accordance of the [place] prior to dispensing any controlled drugs.     Patient presented to the emergency department for evaluation after table saw injury.  Vital signs and exam are reassuring.  Thumb laceration was repaired with stitches.  Surgicel was placed over middle finger avulsion injury.  Hand x-ray negative for acute bony abnormalities.  Tetanus shot is up-to-date.  Wounds were bandaged.  Patient will be discharged home with prescriptions for Keflex and a short course of oxycodone. Patient is to follow up with primary care as directed. Patient is given ED precautions to return to the ED for any worsening or new  symptoms.     ____________________________________________  FINAL CLINICAL IMPRESSION(S) / ED DIAGNOSES  Final diagnoses:  Laceration of left thumb without foreign body without damage to nail, initial encounter  Laceration of left middle finger without foreign body with damage to nail, initial encounter  NEW MEDICATIONS STARTED DURING THIS VISIT:  ED Discharge Orders         Ordered    cephALEXin (KEFLEX) 500 MG capsule  4 times daily     04/06/18 1637    HYDROcodone-acetaminophen (NORCO) 5-325 MG tablet  Every 4 hours PRN,   Status:  Discontinued     04/06/18 1637    oxycodone (OXY-IR) 5 MG capsule  Every 6 hours PRN     04/06/18 1644              This chart was dictated using voice recognition software/Dragon. Despite best efforts to proofread, errors can occur which can change the meaning. Any change was purely unintentional.    Laban Emperor, PA-C 04/06/18 2202    Merlyn Lot, MD 04/06/18 [phone]

## 2018-12-02 ENCOUNTER — Other Ambulatory Visit: Payer: Self-pay

## 2018-12-02 ENCOUNTER — Emergency Department: Payer: Medicare Other

## 2018-12-02 ENCOUNTER — Observation Stay
Admission: EM | Admit: 2018-12-02 | Discharge: 2018-12-03 | Disposition: A | Discharge status: Home / Self Care | Payer: Medicare Other | Attending: Internal Medicine | Admitting: Internal Medicine

## 2018-12-02 DIAGNOSIS — I4510 Unspecified right bundle-branch block: Secondary | ICD-10-CM | POA: Diagnosis not present

## 2018-12-02 DIAGNOSIS — Z720 Tobacco use: Secondary | ICD-10-CM

## 2018-12-02 DIAGNOSIS — Z79899 Other long term (current) drug therapy: Secondary | ICD-10-CM | POA: Diagnosis not present

## 2018-12-02 DIAGNOSIS — Z7951 Long term (current) use of inhaled steroids: Secondary | ICD-10-CM | POA: Diagnosis not present

## 2018-12-02 DIAGNOSIS — Z20828 Contact with and (suspected) exposure to other viral communicable diseases: Secondary | ICD-10-CM | POA: Insufficient documentation

## 2018-12-02 DIAGNOSIS — F1290 Cannabis use, unspecified, uncomplicated: Secondary | ICD-10-CM | POA: Diagnosis not present

## 2018-12-02 DIAGNOSIS — K7290 Hepatic failure, unspecified without coma: Secondary | ICD-10-CM | POA: Diagnosis not present

## 2018-12-02 DIAGNOSIS — K7031 Alcoholic cirrhosis of liver with ascites: Secondary | ICD-10-CM

## 2018-12-02 DIAGNOSIS — R410 Disorientation, unspecified: Secondary | ICD-10-CM

## 2018-12-02 DIAGNOSIS — K7200 Acute and subacute hepatic failure without coma: Secondary | ICD-10-CM | POA: Diagnosis not present

## 2018-12-02 DIAGNOSIS — K7030 Alcoholic cirrhosis of liver without ascites: Secondary | ICD-10-CM | POA: Diagnosis present

## 2018-12-02 DIAGNOSIS — G40909 Epilepsy, unspecified, not intractable, without status epilepticus: Secondary | ICD-10-CM | POA: Diagnosis not present

## 2018-12-02 DIAGNOSIS — F17210 Nicotine dependence, cigarettes, uncomplicated: Secondary | ICD-10-CM | POA: Insufficient documentation

## 2018-12-02 DIAGNOSIS — R569 Unspecified convulsions: Secondary | ICD-10-CM | POA: Insufficient documentation

## 2018-12-02 DIAGNOSIS — K7682 Hepatic encephalopathy: Secondary | ICD-10-CM | POA: Diagnosis present

## 2018-12-02 HISTORY — DX: Cannabis abuse, uncomplicated: F12.10

## 2018-12-02 HISTORY — DX: Alcoholic cirrhosis of liver without ascites: K70.30

## 2018-12-02 LAB — CBC WITH DIFFERENTIAL/PLATELET
Abs Immature Granulocytes: 0.03 10*3/uL (ref 0.00–0.07)
Basophils Absolute: 0.1 10*3/uL (ref 0.0–0.1)
Basophils Relative: 1 %
Eosinophils Absolute: 0.2 10*3/uL (ref 0.0–0.5)
Eosinophils Relative: 3 %
HCT: 38 % — ABNORMAL LOW (ref 39.0–52.0)
Hemoglobin: 14.2 g/dL (ref 13.0–17.0)
Immature Granulocytes: 0 %
Lymphocytes Relative: 20 %
Lymphs Abs: 1.6 10*3/uL (ref 0.7–4.0)
MCH: 35.6 pg — ABNORMAL HIGH (ref 26.0–34.0)
MCHC: 37.4 g/dL — ABNORMAL HIGH (ref 30.0–36.0)
MCV: 95.2 fL (ref 80.0–100.0)
Monocytes Absolute: 1.1 10*3/uL — ABNORMAL HIGH (ref 0.1–1.0)
Monocytes Relative: 13 %
Neutro Abs: 5.1 10*3/uL (ref 1.7–7.7)
Neutrophils Relative %: 63 %
Platelets: 134 10*3/uL — ABNORMAL LOW (ref 150–400)
RBC: 3.99 MIL/uL — ABNORMAL LOW (ref 4.22–5.81)
RDW: 13.2 % (ref 11.5–15.5)
WBC: 8 10*3/uL (ref 4.0–10.5)
nRBC: 0 % (ref 0.0–0.2)

## 2018-12-02 LAB — URINALYSIS, COMPLETE (UACMP) WITH MICROSCOPIC
Bacteria, UA: NONE SEEN
Bilirubin Urine: NEGATIVE
Glucose, UA: NEGATIVE mg/dL
Hgb urine dipstick: NEGATIVE
Ketones, ur: NEGATIVE mg/dL
Leukocytes,Ua: NEGATIVE
Nitrite: NEGATIVE
Protein, ur: 30 mg/dL — AB
Specific Gravity, Urine: 1.027 (ref 1.005–1.030)
pH: 5 (ref 5.0–8.0)

## 2018-12-02 LAB — COMPREHENSIVE METABOLIC PANEL
ALT: 26 U/L (ref 0–44)
AST: 38 U/L (ref 15–41)
Albumin: 3.4 g/dL — ABNORMAL LOW (ref 3.5–5.0)
Alkaline Phosphatase: 82 U/L (ref 38–126)
Anion gap: 10 (ref 5–15)
BUN: 8 mg/dL (ref 6–20)
CO2: 27 mmol/L (ref 22–32)
Calcium: 9.2 mg/dL (ref 8.9–10.3)
Chloride: 101 mmol/L (ref 98–111)
Creatinine, Ser: 0.62 mg/dL (ref 0.61–1.24)
GFR calc Af Amer: >60 mL/min (ref >60)
GFR calc non Af Amer: >60 mL/min (ref >60)
Glucose, Bld: 110 mg/dL — ABNORMAL HIGH (ref 70–99)
Potassium: 3.9 mmol/L (ref 3.5–5.1)
Sodium: 138 mmol/L (ref 135–145)
Total Bilirubin: 2.6 mg/dL — ABNORMAL HIGH (ref 0.3–1.2)
Total Protein: 6.3 g/dL — ABNORMAL LOW (ref 6.5–8.1)

## 2018-12-02 LAB — URINE DRUG SCREEN, QUALITATIVE (ARMC ONLY)
Amphetamines, Ur Screen: NOT DETECTED
Barbiturates, Ur Screen: NOT DETECTED
Benzodiazepine, Ur Scrn: NOT DETECTED
Cannabinoid 50 Ng, Ur ~~LOC~~: POSITIVE — AB
Cocaine Metabolite,Ur ~~LOC~~: NOT DETECTED
MDMA (Ecstasy)Ur Screen: NOT DETECTED
Methadone Scn, Ur: NOT DETECTED
Opiate, Ur Screen: NOT DETECTED
Phencyclidine (PCP) Ur S: NOT DETECTED
Tricyclic, Ur Screen: NOT DETECTED

## 2018-12-02 LAB — ACETAMINOPHEN LEVEL: Acetaminophen (Tylenol), Serum: <10 ug/mL — ABNORMAL LOW (ref 10–30)

## 2018-12-02 LAB — ETHANOL: Alcohol, Ethyl (B): <10 mg/dL (ref <10)

## 2018-12-02 LAB — PROTIME-INR
INR: 1.3 — ABNORMAL HIGH (ref 0.8–1.2)
Prothrombin Time: 15.7 seconds — ABNORMAL HIGH (ref 11.4–15.2)

## 2018-12-02 LAB — SALICYLATE LEVEL: Salicylate Lvl: <7 mg/dL (ref 2.8–30.0)

## 2018-12-02 LAB — AMMONIA: Ammonia: 43 umol/L — ABNORMAL HIGH (ref 9–35)

## 2018-12-02 MED ORDER — FLUOROURACIL 5 % EX CREA: 1.0000 "application " | TOPICAL_CREAM | Freq: Two times a day (BID) | CUTANEOUS | Status: DC

## 2018-12-02 MED ORDER — SERTRALINE HCL 25 MG PO TABS
25.0000 mg | ORAL_TABLET | Freq: Every day | ORAL | Status: DC
  Filled 2018-12-02: qty 1

## 2018-12-02 MED ORDER — SODIUM CHLORIDE 0.9 % IV SOLN: INTRAVENOUS | Status: DC

## 2018-12-02 MED ORDER — ONDANSETRON HCL 4 MG PO TABS
4.0000 mg | ORAL_TABLET | Freq: Four times a day (QID) | ORAL | Status: DC | PRN
  Filled 2018-12-02: qty 1

## 2018-12-02 MED ORDER — FLUTICASONE PROPIONATE 50 MCG/ACT NA SUSP: 1.0000 | Freq: Every day | NASAL | Status: DC

## 2018-12-02 MED ORDER — ACETAMINOPHEN 650 MG RE SUPP: 650.0000 mg | Freq: Four times a day (QID) | RECTAL | Status: DC | PRN

## 2018-12-02 MED ORDER — LACTULOSE 10 GM/15ML PO SOLN
20.0000 g | Freq: Once | ORAL | Status: AC
  Administered 2018-12-02: 20 g via ORAL
  Filled 2018-12-02: qty 30

## 2018-12-02 MED ORDER — TRAZODONE HCL 50 MG PO TABS: 25.0000 mg | ORAL_TABLET | Freq: Every evening | ORAL | Status: DC | PRN

## 2018-12-02 MED ORDER — ONDANSETRON HCL 4 MG/2ML IJ SOLN: 4.0000 mg | Freq: Four times a day (QID) | INTRAMUSCULAR | Status: DC | PRN

## 2018-12-02 MED ORDER — ACETAMINOPHEN 325 MG PO TABS
650.0000 mg | ORAL_TABLET | Freq: Four times a day (QID) | ORAL | Status: DC | PRN
  Administered 2018-12-03: 01:00:00 650 mg via ORAL
  Filled 2018-12-02: qty 2

## 2018-12-02 MED ORDER — MAGNESIUM HYDROXIDE 400 MG/5ML PO SUSP: 30.0000 mL | Freq: Every day | ORAL | Status: DC | PRN

## 2018-12-02 MED ORDER — ADULT MULTIVITAMIN W/MINERALS CH
1.0000 | ORAL_TABLET | Freq: Every day | ORAL | Status: DC
  Filled 2018-12-02: qty 1

## 2018-12-02 MED ORDER — TRAZODONE HCL 50 MG PO TABS
100.0000 mg | ORAL_TABLET | Freq: Every day | ORAL | Status: DC
  Administered 2018-12-03: 01:00:00 100 mg via ORAL
  Filled 2018-12-02: qty 2

## 2018-12-02 MED ORDER — VITAMIN B-1 100 MG PO TABS
100.0000 mg | ORAL_TABLET | Freq: Every day | ORAL | Status: DC
  Filled 2018-12-02: qty 1

## 2018-12-02 MED ORDER — LACTULOSE 10 GM/15ML PO SOLN
30.0000 g | Freq: Two times a day (BID) | ORAL | Status: DC
  Administered 2018-12-03 (×2): 30 g via ORAL
  Filled 2018-12-02 (×3): qty 60

## 2018-12-02 MED ORDER — SPIRONOLACTONE 25 MG PO TABS
50.0000 mg | ORAL_TABLET | Freq: Two times a day (BID) | ORAL | Status: DC
  Administered 2018-12-03: 11:00:00 50 mg via ORAL
  Filled 2018-12-02 (×2): qty 2

## 2018-12-02 MED ORDER — FUROSEMIDE 20 MG PO TABS
20.0000 mg | ORAL_TABLET | Freq: Two times a day (BID) | ORAL | Status: DC
  Administered 2018-12-03: 06:00:00 20 mg via ORAL
  Filled 2018-12-02 (×2): qty 1

## 2018-12-02 MED ORDER — FOLIC ACID 1 MG PO TABS
1.0000 mg | ORAL_TABLET | Freq: Every day | ORAL | Status: DC
  Filled 2018-12-02: qty 1

## 2018-12-02 MED ORDER — PROPRANOLOL HCL 20 MG PO TABS
20.0000 mg | ORAL_TABLET | Freq: Every day | ORAL | Status: DC
  Administered 2018-12-03: 11:00:00 20 mg via ORAL
  Filled 2018-12-02: qty 1

## 2018-12-02 MED ORDER — ENOXAPARIN SODIUM 40 MG/0.4ML ~~LOC~~ SOLN
40.0000 mg | SUBCUTANEOUS | Status: DC
  Filled 2018-12-02 (×2): qty 0.4

## 2018-12-02 NOTE — ED Notes (Signed)
Per pt wife, pt ran out of lactulose on Thursday and did not take two of his three scheduled Friday doses. Pt wife reports last dose take was Friday morning, and pt and pt wife say he has not taken any lactulose Saturday.

## 2018-12-02 NOTE — ED Notes (Signed)
Pt wife at bedside.

## 2018-12-02 NOTE — ED Provider Notes (Signed)
[facility] Emergency Department Provider Note  ____________________________________________   First MD Initiated Contact with Patient 12/02/18 2041     (approximate)  I have reviewed the triage vital signs and the nursing notes.  History  Chief Complaint No chief complaint on file.    HPI Adrian Hale is a 58 y.o. male with history of cirrhosis, seizure, who presents emerged department for confusion.  Patient arrives via EMS, who was activated by the patient's wife.  Patient has reportedly been intermittently confused throughout the day.  On arrival to the emergency department he is oriented to self and the day of the week, but otherwise does not make sense when answering questions.  He is moving all extremities equally.  Caveat: History significantly limited due to the patient's confusion.  Update: On arrival of the patient's wife, between the patient and wife, they discovered he is missed several doses of his lactulose, has not taken any since Friday morning and typically takes it 3 times daily. She reports a "hand cramping" episode prior to most recent episode of confusion, but denies any tonic clonic shaking like activity.   Past Medical Hx No past medical history on file.  Problem List There are no active problems to display for this patient.   Past Surgical Hx Noncontributory  Medications Prior to Admission medications   Not on File    Allergies Patient has no allergy information on record.  Family Hx No family history on file.  Social Hx Social History   Tobacco Use  . Smoking status: Not on file  Substance Use Topics  . Alcohol use: Not on file  . Drug use: Not on file     Review of Systems Unable to obtain due to patient's altered mental status.   Physical Exam  Vital Signs: ED Triage Vitals [12/02/18 2043]  Enc Vitals Group     BP (!) 146/71     Pulse Rate 67     Resp (!) 22     Temp 98.9 F (37.2 C)     Temp Source Oral     SpO2 94 %     Weight 220 lb 7.4 oz (100 kg)     Height 6' (1.829 m)     Head Circumference      Peak Flow      Pain Score      Pain Loc      Pain Edu?      Excl. in [place]?      Constitutional: Awake.  Oriented to self and day of week. Head: Normocephalic. Atraumatic. Eyes: Conjunctivae clear. Sclera mildly icteric. Nose: No congestion. No rhinorrhea. Mouth/Throat: Wearing mask.  Neck: No stridor.   Cardiovascular: Normal rate, regular rhythm. Extremities well perfused. Respiratory: Normal respiratory effort.  Lungs CTAB. Gastrointestinal: Soft. Non-tender. Non-distended.  Musculoskeletal: No lower extremity edema. No deformities. Neurologic: No dysarthria or aphasia.  Does not make sense when answering questions.  Moves all extremities equally. Skin: Skin is warm, dry and intact. No rash noted. Psychiatric: Mood and affect are appropriate for situation.  EKG  Personally reviewed.   Rate: 61 Rhythm: sinus Axis: normal Intervals: WNL Non-specific T wave changes in V1 through V3 No acute ischemic changes No STEMI    Radiology  CT: IMPRESSION:  Normal unenhanced CT scan of the brain.    Procedures  Procedure(s) performed (including critical care):  Procedures   Initial Impression / Assessment and Plan / ED Course  58 y.o. male with history  of cirrhosis, hydrocephalus who presents to the ED for confusion  Ddx: hepatic encephalopathy, intracranial abnormality, electrolyte abnormality, infection, seizure  Will obtain labs, imaging  Work-up reveals mildly elevated ammonia at 43.  Cannabis positive.  Urine negative for infection.  CT negative.  We will give dose of lactulose here, and plan for admission for continued doses and observation to ensure his mental status clears.  On reevaluation, he is already improving, now alert and oriented x 3 - perhaps he was post ictal on arrival vs waxing and waning MS 2/2 hepatic encephalopathy.  Discussed  with hospitalist for admission.   Final Clinical Impression(s) / ED Diagnosis  Final diagnoses:  Confusion       Note:  This document was prepared using Dragon voice recognition software and may include unintentional dictation errors.   Lilia Pro., MD 12/02/18 [phone]

## 2018-12-02 NOTE — ED Notes (Signed)
Per pt and pt's wife, takes 300mg  trazadone at home to sleep

## 2018-12-02 NOTE — ED Notes (Addendum)
Melody, ed tech at bedside as sitter until spouse arrives to promote compliance. Will review allergies with spouse when arrives.

## 2018-12-02 NOTE — ED Triage Notes (Addendum)
Pt with ams since this am per ems. Pt with history of cirrhosis, hydrocephalus. Pt complains of headache and is alert to self and day only. fsbs 119 per ems

## 2018-12-02 NOTE — ED Notes (Signed)
Patient transported to CT 

## 2018-12-03 ENCOUNTER — Encounter: Payer: Self-pay | Admitting: Family Medicine

## 2018-12-03 DIAGNOSIS — K7030 Alcoholic cirrhosis of liver without ascites: Secondary | ICD-10-CM | POA: Diagnosis present

## 2018-12-03 DIAGNOSIS — R569 Unspecified convulsions: Secondary | ICD-10-CM | POA: Diagnosis not present

## 2018-12-03 DIAGNOSIS — K7290 Hepatic failure, unspecified without coma: Secondary | ICD-10-CM | POA: Diagnosis not present

## 2018-12-03 DIAGNOSIS — G40909 Epilepsy, unspecified, not intractable, without status epilepticus: Secondary | ICD-10-CM

## 2018-12-03 DIAGNOSIS — Z720 Tobacco use: Secondary | ICD-10-CM | POA: Diagnosis present

## 2018-12-03 LAB — CBC
HCT: 36.5 % — ABNORMAL LOW (ref 39.0–52.0)
Hemoglobin: 13.1 g/dL (ref 13.0–17.0)
MCH: 35.6 pg — ABNORMAL HIGH (ref 26.0–34.0)
MCHC: 35.9 g/dL (ref 30.0–36.0)
MCV: 99.2 fL (ref 80.0–100.0)
Platelets: 110 10*3/uL — ABNORMAL LOW (ref 150–400)
RBC: 3.68 MIL/uL — ABNORMAL LOW (ref 4.22–5.81)
RDW: 13.4 % (ref 11.5–15.5)
WBC: 7.8 10*3/uL (ref 4.0–10.5)
nRBC: 0 % (ref 0.0–0.2)

## 2018-12-03 LAB — BASIC METABOLIC PANEL
Anion gap: 10 (ref 5–15)
BUN: 8 mg/dL (ref 6–20)
CO2: 24 mmol/L (ref 22–32)
Calcium: 8.7 mg/dL — ABNORMAL LOW (ref 8.9–10.3)
Chloride: 105 mmol/L (ref 98–111)
Creatinine, Ser: 0.62 mg/dL (ref 0.61–1.24)
GFR calc Af Amer: >60 mL/min (ref >60)
GFR calc non Af Amer: >60 mL/min (ref >60)
Glucose, Bld: 86 mg/dL (ref 70–99)
Potassium: 3.3 mmol/L — ABNORMAL LOW (ref 3.5–5.1)
Sodium: 139 mmol/L (ref 135–145)

## 2018-12-03 LAB — SARS CORONAVIRUS 2 (TAT 6-24 HRS): SARS Coronavirus 2: NEGATIVE

## 2018-12-03 LAB — AMMONIA: Ammonia: 32 umol/L (ref 9–35)

## 2018-12-03 MED ORDER — LORAZEPAM BOLUS VIA INFUSION: 1.0000 mg | INTRAVENOUS | Status: DC | PRN

## 2018-12-03 MED ORDER — LORAZEPAM 2 MG/ML IJ SOLN: 1.0000 mg | INTRAMUSCULAR | Status: DC | PRN

## 2018-12-03 MED ORDER — LACTULOSE 10 GM/15ML PO SOLN: 20.0000 g | Freq: Three times a day (TID) | ORAL | 3 refills | Status: DC

## 2018-12-03 NOTE — ED Notes (Signed)
Patient ambulatory to the bathroom without assistance.

## 2018-12-03 NOTE — ED Notes (Signed)
Pt given water as requested. Will give meds once available.

## 2018-12-03 NOTE — ED Notes (Addendum)
Pt resting comfortably in ED stretcher. ED stretcher locked and in lowest position. Pt A&Ox4 at this time. Pt has not complaints and NAD at this time. Will continue to monitor. Call bell within reach.

## 2018-12-03 NOTE — ED Notes (Signed)
Pt given meal tray.

## 2018-12-03 NOTE — ED Notes (Addendum)
Larita Fife gave report to [place], rn waiting on 2330 medications from pharmacy.

## 2018-12-03 NOTE — H&P (Signed)
[facility] NAME: Adrian Hale    MR#:  [id number]  DATE OF BIRTH:  Sep 01, 1960  DATE OF ADMISSION:  12/02/2018  PRIMARY CARE PHYSICIAN: Hortencia Pilar, MD   REQUESTING/REFERRING PHYSICIAN: Derrell Lolling, MD CHIEF COMPLAINT:   Chief Complaint  Patient presents with  . Altered Mental Status  The patient was seen and examined on 12/02/2018  HISTORY OF PRESENT ILLNESS:  Adrian Hale  is a 58 y.o. male with a known history of alcoholic liver cirrhosis, seizures, tobacco and marijuana abuse, who presented to the emergency room with acute onset of altered mental status with confusion that was witnessed by his wife who stated that he was talking funny and very somnolent.  He apparently tried to go to the bathroom and did not reach there before he had the episode of stool incontinence.  There were no witnessed seizures by his wife and no tongue bites.  He denied any shaking.  No recent fever or chills.  No nausea vomiting or abdominal pain.  Is been having headache without dizziness or blurred vision.  No paresthesias or focal muscle weakness.  No cough or wheezing or hemoptysis.  No chest pain or palpitations.  No recent exposure to COVID-19.  Upon presentation to the emergency room, pressure was 146/71 with respiratory to 22 and otherwise normal vital signs.  Labs revealed unremarkable CMP.  Ammonia level came back slightly elevated at 43.  CBC was unremarkable.  Urinalysis was unremarkable.  Alcohol levels less than 10 and salicylate less than 7 urine drug screen was positive for marijuana.  Noncontrasted CT scan revealed no acute intracranial abnormalities.  EKG showed showed normal sinus rhythm with a rate of 61 with incomplete right bundle branch block and T wave inversion in V1 through V3.  The patient was given 20 g of p.o. lactulose.  He will be admitted to an observation medically monitored bed for further evaluation and management. PAST MEDICAL  HISTORY:   Past Medical History:  Diagnosis Date  . ALC (alcoholic liver cirrhosis) ([place])   . Marijuana abuse   . Seizures ([place])   . Tobacco abuse     PAST SURGICAL HISTORY:  None.  SOCIAL HISTORY:   Social History   Tobacco Use  . Smoking status: Current Every Day Smoker    Packs/day: 0.50  Substance Use Topics  . Alcohol use: Yes    Comment: Twice a month.  He used to drink heavily in the past.    FAMILY HISTORY:   Family History  Problem Relation Age of Onset  . Hypertension Mother   . Hypertension Father     DRUG ALLERGIES:   Allergies  Allergen Reactions  . Poison Ivy Extract   . Poison Oak Extract     REVIEW OF SYSTEMS:   ROS As per history of present illness. All pertinent systems were reviewed above. Constitutional,  HEENT, cardiovascular, respiratory, GI, GU, musculoskeletal, neuro, psychiatric, endocrine,  integumentary and hematologic systems were reviewed and are otherwise  negative/unremarkable except for positive findings mentioned above in the HPI.   MEDICATIONS AT HOME:   Prior to Admission medications   Medication Sig Start Date End Date Taking? Authorizing Provider  fluorouracil (EFUDEX) 5 % cream Apply 1 application topically 2 (two) times daily. 08/15/18   [provider]  fluticasone (FLONASE) 50 MCG/ACT nasal spray Place 1 spray into both nostrils daily. 10/28/18   [provider]  furosemide (LASIX) 20 MG tablet Take 20 mg  by mouth 2 (two) times daily. 10/02/18   [provider]  lactulose ([place]) 10 GM/15ML solution  11/30/18   [provider]  propranolol (INDERAL) 20 MG tablet Take 20 mg by mouth daily. 09/06/18   [provider]  sertraline (ZOLOFT) 25 MG tablet Take 25 mg by mouth daily. 10/24/18   [provider]  spironolactone (ALDACTONE) 50 MG tablet Take 50 mg by mouth 2 (two) times daily. 09/06/18   [provider]  traMADol (ULTRAM) 50 MG tablet Take 50 mg by  mouth 3 (three) times daily as needed. 10/31/18   [provider]  traZODone (DESYREL) 100 MG tablet Take 100 mg by mouth at bedtime. 11/21/18   [provider]      VITAL SIGNS:  Blood pressure 132/74, pulse (!) 59, temperature 98.9 F (37.2 C), temperature source Oral, resp. rate 15, height 6' (1.829 m), weight 100 kg, SpO2 93 %.  PHYSICAL EXAMINATION:  Physical Exam  GENERAL:  58 y.o.-year-old Caucasian male patient lying in the bed with no acute distress.  EYES: Pupils equal, round, reactive to light and accommodation. No scleral icterus. Extraocular muscles intact.  HEENT: Head atraumatic, normocephalic. Oropharynx and nasopharynx clear.  NECK:  Supple, no jugular venous distention. No thyroid enlargement, no tenderness.  LUNGS: Normal breath sounds bilaterally, no wheezing, rales,rhonchi or crepitation. No use of accessory muscles of respiration.  CARDIOVASCULAR: Regular rate and rhythm, S1, S2 normal. No murmurs, rubs, or gallops.  ABDOMEN: Soft, nondistended, nontender. Bowel sounds present. No organomegaly or mass.  Positive shifting dullness EXTREMITIES: No pedal edema, cyanosis, or clubbing.  NEUROLOGIC: Cranial nerves II through XII are intact. Muscle strength 5/5 in all extremities. Sensation intact. Gait not checked.  PSYCHIATRIC: The patient is currently alert and oriented x 3.  Normal affect and good eye contact.  He was slightly slow answering questions. SKIN: No obvious rash, lesion, or ulcer.   LABORATORY PANEL:   CBC Recent Labs  Lab 12/02/18 2046  WBC 8.0  HGB 14.2  HCT 38.0*  PLT 134*   ------------------------------------------------------------------------------------------------------------------  Chemistries  Recent Labs  Lab 12/02/18 2046  NA 138  K 3.9  CL 101  CO2 27  GLUCOSE 110*  BUN 8  CREATININE 0.62  CALCIUM 9.2  AST 38  ALT 26  ALKPHOS 82  BILITOT 2.6*    ------------------------------------------------------------------------------------------------------------------  Cardiac Enzymes No results for input(s): TROPONINI in the last 168 hours. ------------------------------------------------------------------------------------------------------------------  RADIOLOGY:  Ct Head Wo Contrast  Result Date: 12/02/2018 CLINICAL DATA:  Pt with ams since this am per ems. Pt with history of cirrhosis, hydrocephalus. Pt complains of headache and is alert to self and day only. fsbs 119 per ems EXAM: CT HEAD WITHOUT CONTRAST TECHNIQUE: Contiguous axial images were obtained from the base of the skull through the vertex without intravenous contrast. COMPARISON:  None. FINDINGS: Brain: No evidence of acute infarction, hemorrhage, hydrocephalus, extra-axial collection or mass lesion/mass effect. Vascular: No hyperdense vessel or unexpected calcification. Skull: Normal. Negative for fracture or focal lesion. Sinuses/Orbits: Globes and orbits are unremarkable. Visualized sinuses and mastoid air cells are clear. Other: None. IMPRESSION: Normal unenhanced CT scan of the brain. Electronically Signed   By: Lajean Manes M.D.   On: 12/02/2018 21:23      IMPRESSION AND PLAN:   1.  Mild hepatic encephalopathy with history of alcoholic liver cirrhosis.  The patient will be admitted to an observation medical monitored bed.  We will continue him on p.o. lactulose 30 mL p.o.  twice daily and follow ammonia level in a.m.  2.  Seizure disorder.  This could be alcohol withdrawal seizure however he quit alcohol a while ago and his most recent seizure episode was about a week ago for which he apparently had an outpatient EEG and was not called yet about results.  He will be monitored for seizures while he is here.  We will place him on as needed IV Ativan.  I really doubt that he had a seizure episode at night.  We will follow neuro checks every 4 hours for 24 hours.  3.  Ongoing  drug abuse.  I counseled him for cessation and he will receive further counseling here.  4.  Marijuana abuse.  I counseled him for cessation.  5.  DVT prophylaxis.  Subtenons Lovenox   All the records are reviewed and case discussed with ED provider. The plan of care was discussed in details with the patient (and family). I answered all questions. The patient agreed to proceed with the above mentioned plan. Further management will depend upon hospital course.   CODE STATUS: Full code  TOTAL TIME TAKING CARE OF THIS PATIENT: 55 minutes.    Christel Mormon M.D on 12/03/2018 at 12:09 AM  [facility]   From 7 PM-7 AM, contact night-coverage [email or web address]  CC: Primary care physician; Hortencia Pilar, MD   Note: This dictation was prepared with Dragon dictation along with smaller phrase technology. Any transcriptional errors that result from this process are unintentional.

## 2018-12-03 NOTE — ED Notes (Signed)
Pt ambulated to restroom. Refused to use urinal

## 2018-12-03 NOTE — ED Notes (Signed)
Pt ambulatory to BR, refuses urinal at this time.  Admitting MD has assessed pt this AM and pt to be discharged.

## 2018-12-03 NOTE — ED Notes (Signed)
Seizure pads applied to pt's bedrails. Bed locked low. Call bell within reach. Rails up.

## 2018-12-03 NOTE — ED Notes (Signed)
Checked tube station, pyxis and pt specific med bins and cannot find due meds. Messaged pharm.

## 2018-12-03 NOTE — ED Notes (Signed)
ED pyxis out of 100mg  trazodone. Called pharm as won't allow to override for 2 50mg  tablets as pharm suggested. Shanon Brow in [place] states he will change order so it can be pulled from pyxis.

## 2018-12-03 NOTE — ED Notes (Signed)
Pt wife is here to pick pt up. Pt is A&Ox4 at this time and has a steady gait. V/S WNL prior to discharge. Discharge instructions and prescriptions reviewed. Pt verbalized understanding.

## 2018-12-03 NOTE — ED Notes (Signed)
Report received from Lea, RN.

## 2018-12-03 NOTE — Discharge Summary (Signed)
Discharge Summary  Adrian Hale [id number] DOB: [birth year]  PCP: Hortencia Pilar, MD  Admit date: 12/02/2018 Discharge date: 12/03/2018  Time spent: 25 minutes  Recommendations for Outpatient Follow-up:  1. Patient will follow up with his PCP in the next few weeks. 2. Has been given a repeat prescription for lactulose  Discharge Diagnoses:  Active Hospital Problems   Diagnosis Date Noted   Hepatic encephalopathy (Adrian Hale) [id number]   ALC (alcoholic liver cirrhosis) ([facility])    Seizures (Adrian Hale)    Tobacco abuse     Resolved Hospital Problems  No resolved problems to display.    Discharge Condition: Improved, being discharged home  Diet recommendation: Low-sodium  Vitals:   12/03/18 0610 12/03/18 0714  BP: 126/63 130/64  Pulse: (!) 54 (!) 52  Resp: 18 18  Temp:    SpO2:  94%    History of present illness:  58 year old male with past medical history of alcoholic liver cirrhosis, seizures and tobacco marijuana use who was brought into the emergency room by his wife after having altered mental status, drowsiness and episode of stool incontinence.  According to the patient's wife, he had run out of lactulose the day prior, and had called his PCP, but was not able to get a refill in time.  Patient also admits later on that he takes his lactulose inconsistently, because he does not like having so much of a bowel movement.  In the emergency room, patient found to have a normal CT scan of the head and only abnormalities were mildly elevated ammonia level at 43.  Vital signs stable.  Given a dose of lactulose and hospitalist were called for further evaluation.  Hospital Course:  Principal Problem:   Hepatic encephalopathy (Adrian Hale) with history of alcoholic liver cirrhosis: Patient no longer drinks.  After giving a dose of lactulose, by following morning, patient is fully alert and oriented knowing who the president and president elect are and what date it is.  He says that he  will try to be better about taking his lactulose.  I spoke with his wife and have given him a refill for this prescription. Active Problems:   Seizures (Adrian Hale): Stable.  No evidence of seizures during ER visit.  Continue home medications.    Tobacco and marijuana abuse: Counseled about this.   Procedures:  None  Consultations:  None  Discharge Exam: BP 130/64    Pulse (!) 52    Temp 98.9 F (37.2 C) (Oral)    Resp 18    Ht 6' (1.829 m)    Wt 100 kg    SpO2 94%    BMI 29.90 kg/m   General: Alert and oriented x3, no acute distress Cardiovascular: Regular rate and rhythm, S1-S2 Respiratory: Clear to auscultation bilaterally  Discharge Instructions You were cared for by a hospitalist during your hospital stay. If you have any questions about your discharge medications or the care you received while you were in the hospital after you are discharged, you can call the unit and asked to speak with the hospitalist on call if the hospitalist that took care of you is not available. Once you are discharged, your primary care physician will handle any further medical issues. Please note that NO REFILLS for any discharge medications will be authorized once you are discharged, as it is imperative that you return to your primary care physician (or establish a relationship with a primary care physician if you do not have one) for your aftercare needs so that  they can reassess your need for medications and monitor your lab values.  Discharge Instructions    Diet - low sodium heart healthy   Complete by: As directed    Increase activity slowly   Complete by: As directed      Allergies as of 12/03/2018      Reactions   Poison Ivy Extract    Poison Oak Extract       Medication List    TAKE these medications   fluorouracil 5 % cream Commonly known as: EFUDEX Apply 1 application topically 2 (two) times daily.   fluticasone 50 MCG/ACT nasal spray Commonly known as: FLONASE Place 1 spray into  both nostrils daily.   furosemide 20 MG tablet Commonly known as: LASIX Take 20 mg by mouth 2 (two) times daily.   lactulose 10 GM/15ML solution Commonly known as: CHRONULAC Take 30 mLs (20 g total) by mouth 3 (three) times daily.   MELATIN PO Take 1 tablet by mouth at bedtime as needed.   propranolol 20 MG tablet Commonly known as: INDERAL Take 20 mg by mouth daily.   rifaximin 550 MG Tabs tablet Commonly known as: XIFAXAN Take 550 mg by mouth 2 (two) times daily.   sertraline 25 MG tablet Commonly known as: ZOLOFT Take 25 mg by mouth daily.   spironolactone 50 MG tablet Commonly known as: ALDACTONE Take 50 mg by mouth 2 (two) times daily.   tiZANidine 2 MG tablet Commonly known as: ZANAFLEX Take 2 mg by mouth 3 (three) times daily.   traMADol 50 MG tablet Commonly known as: ULTRAM Take 50 mg by mouth 3 (three) times daily as needed.   traZODone 100 MG tablet Commonly known as: DESYREL Take 300 mg by mouth at bedtime.      Allergies  Allergen Reactions   Poison Ivy Extract    Poison Oak Extract       The results of significant diagnostics from this hospitalization (including imaging, microbiology, ancillary and laboratory) are listed below for reference.    Significant Diagnostic Studies: Ct Head Wo Contrast  Result Date: 12/02/2018 CLINICAL DATA:  Pt with ams since this am per ems. Pt with history of cirrhosis, hydrocephalus. Pt complains of headache and is alert to self and day only. fsbs 119 per ems EXAM: CT HEAD WITHOUT CONTRAST TECHNIQUE: Contiguous axial images were obtained from the base of the skull through the vertex without intravenous contrast. COMPARISON:  None. FINDINGS: Brain: No evidence of acute infarction, hemorrhage, hydrocephalus, extra-axial collection or mass lesion/mass effect. Vascular: No hyperdense vessel or unexpected calcification. Skull: Normal. Negative for fracture or focal lesion. Sinuses/Orbits: Globes and orbits are  unremarkable. Visualized sinuses and mastoid air cells are clear. Other: None. IMPRESSION: Normal unenhanced CT scan of the brain. Electronically Signed   By: Lajean Manes M.D.   On: 12/02/2018 21:23    Microbiology: No results found for this or any previous visit (from the past 240 hour(s)).   Labs: Basic Metabolic Panel: Recent Labs  Lab 12/02/18 2046 12/03/18 0904  NA 138 139  K 3.9 3.3*  CL 101 105  CO2 27 24  GLUCOSE 110* 86  BUN 8 8  CREATININE 0.62 0.62  CALCIUM 9.2 8.7*   Liver Function Tests: Recent Labs  Lab 12/02/18 2046  AST 38  ALT 26  ALKPHOS 82  BILITOT 2.6*  PROT 6.3*  ALBUMIN 3.4*   No results for input(s): LIPASE, AMYLASE in the last 168 hours. Recent Labs  Lab 12/02/18 2046  12/03/18 0904  AMMONIA 43* 32   CBC: Recent Labs  Lab 12/02/18 2046 12/03/18 0904  WBC 8.0 7.8  NEUTROABS 5.1  --   HGB 14.2 13.1  HCT 38.0* 36.5*  MCV 95.2 99.2  PLT 134* 110*   Cardiac Enzymes: No results for input(s): CKTOTAL, CKMB, CKMBINDEX, TROPONINI in the last 168 hours. BNP: BNP (last 3 results) No results for input(s): BNP in the last 8760 hours.  ProBNP (last 3 results) No results for input(s): PROBNP in the last 8760 hours.  CBG: No results for input(s): GLUCAP in the last 168 hours.     Signed:  Annita Brod, MD [facility] 12/03/2018, 10:11 AM

## 2018-12-04 ENCOUNTER — Encounter: Payer: Self-pay | Admitting: *Deleted

## 2018-12-29 ENCOUNTER — Inpatient Hospital Stay
Admission: AD | Admit: 2018-12-29 | Payer: Medicare Other | Source: Other Acute Inpatient Hospital | Admitting: Pulmonary Disease

## 2018-12-29 ENCOUNTER — Emergency Department: Payer: Medicare Other

## 2018-12-29 ENCOUNTER — Emergency Department
Admission: EM | Admit: 2018-12-29 | Discharge: 2018-12-30 | Disposition: A | Discharge status: Other Acute Inpatient Hospital | Payer: Medicare Other | Attending: Emergency Medicine | Admitting: Emergency Medicine

## 2018-12-29 DIAGNOSIS — Z20828 Contact with and (suspected) exposure to other viral communicable diseases: Secondary | ICD-10-CM | POA: Diagnosis not present

## 2018-12-29 DIAGNOSIS — F17210 Nicotine dependence, cigarettes, uncomplicated: Secondary | ICD-10-CM | POA: Insufficient documentation

## 2018-12-29 DIAGNOSIS — I10 Essential (primary) hypertension: Secondary | ICD-10-CM | POA: Insufficient documentation

## 2018-12-29 DIAGNOSIS — Z79899 Other long term (current) drug therapy: Secondary | ICD-10-CM | POA: Diagnosis not present

## 2018-12-29 DIAGNOSIS — R569 Unspecified convulsions: Secondary | ICD-10-CM | POA: Diagnosis present

## 2018-12-29 DIAGNOSIS — J449 Chronic obstructive pulmonary disease, unspecified: Secondary | ICD-10-CM | POA: Insufficient documentation

## 2018-12-29 DIAGNOSIS — G40901 Epilepsy, unspecified, not intractable, with status epilepticus: Secondary | ICD-10-CM

## 2018-12-29 LAB — AMMONIA: Ammonia: 93 umol/L — ABNORMAL HIGH (ref 9–35)

## 2018-12-29 LAB — BLOOD GAS, ARTERIAL
Acid-base deficit: 3.9 mmol/L — ABNORMAL HIGH (ref 0.0–2.0)
Acid-base deficit: 1.7 mmol/L (ref 0.0–2.0)
Bicarbonate: 25.2 mmol/L (ref 20.0–28.0)
Bicarbonate: 26.5 mmol/L (ref 20.0–28.0)
FIO2: 1
FIO2: 0.6
MECHVT: 450 mL
O2 Saturation: 98.3 %
O2 Saturation: 99.1 %
PEEP: 5 cmH2O
Patient temperature: 37
Patient temperature: 37
RATE: 18 resp/min
pCO2 arterial: 63 mmHg — ABNORMAL HIGH (ref 32.0–48.0)
pCO2 arterial: 59 mmHg — ABNORMAL HIGH (ref 32.0–48.0)
pH, Arterial: 7.21 — ABNORMAL LOW (ref 7.350–7.450)
pH, Arterial: 7.26 — ABNORMAL LOW (ref 7.350–7.450)
pO2, Arterial: 129 mmHg — ABNORMAL HIGH (ref 83.0–108.0)
pO2, Arterial: 152 mmHg — ABNORMAL HIGH (ref 83.0–108.0)

## 2018-12-29 LAB — CBC WITH DIFFERENTIAL/PLATELET
Abs Immature Granulocytes: 0.07 K/uL (ref 0.00–0.07)
Basophils Absolute: 0 K/uL (ref 0.0–0.1)
Basophils Relative: 0 %
Eosinophils Absolute: 0 K/uL (ref 0.0–0.5)
Eosinophils Relative: 0 %
HCT: 39.2 % (ref 39.0–52.0)
Hemoglobin: 14.1 g/dL (ref 13.0–17.0)
Immature Granulocytes: 1 %
Lymphocytes Relative: 8 %
Lymphs Abs: 1 K/uL (ref 0.7–4.0)
MCH: 35.9 pg — ABNORMAL HIGH (ref 26.0–34.0)
MCHC: 36 g/dL (ref 30.0–36.0)
MCV: 99.7 fL (ref 80.0–100.0)
Monocytes Absolute: 0.8 K/uL (ref 0.1–1.0)
Monocytes Relative: 7 %
Neutro Abs: 10.3 K/uL — ABNORMAL HIGH (ref 1.7–7.7)
Neutrophils Relative %: 84 %
Platelets: 154 K/uL (ref 150–400)
RBC: 3.93 MIL/uL — ABNORMAL LOW (ref 4.22–5.81)
RDW: 13.8 % (ref 11.5–15.5)
WBC: 12.2 K/uL — ABNORMAL HIGH (ref 4.0–10.5)
nRBC: 0 % (ref 0.0–0.2)

## 2018-12-29 LAB — BASIC METABOLIC PANEL
Anion gap: 13 (ref 5–15)
BUN: 7 mg/dL (ref 6–20)
CO2: 20 mmol/L — ABNORMAL LOW (ref 22–32)
Calcium: 8.7 mg/dL — ABNORMAL LOW (ref 8.9–10.3)
Chloride: 106 mmol/L (ref 98–111)
Creatinine, Ser: 1.09 mg/dL (ref 0.61–1.24)
GFR calc Af Amer: >60 mL/min (ref >60)
GFR calc non Af Amer: >60 mL/min (ref >60)
Glucose, Bld: 150 mg/dL — ABNORMAL HIGH (ref 70–99)
Potassium: 3.9 mmol/L (ref 3.5–5.1)
Sodium: 139 mmol/L (ref 135–145)

## 2018-12-29 LAB — RESPIRATORY PANEL BY RT PCR (FLU A&B, COVID)
Influenza A by PCR: NEGATIVE
Influenza B by PCR: NEGATIVE
SARS Coronavirus 2 by RT PCR: NEGATIVE

## 2018-12-29 LAB — URINE DRUG SCREEN, QUALITATIVE (ARMC ONLY)
Amphetamines, Ur Screen: POSITIVE — AB
Barbiturates, Ur Screen: NOT DETECTED
Benzodiazepine, Ur Scrn: POSITIVE — AB
Cannabinoid 50 Ng, Ur ~~LOC~~: POSITIVE — AB
Cocaine Metabolite,Ur ~~LOC~~: NOT DETECTED
MDMA (Ecstasy)Ur Screen: NOT DETECTED
Methadone Scn, Ur: NOT DETECTED
Opiate, Ur Screen: NOT DETECTED
Phencyclidine (PCP) Ur S: NOT DETECTED
Tricyclic, Ur Screen: POSITIVE — AB

## 2018-12-29 LAB — HEPATIC FUNCTION PANEL
ALT: 38 U/L (ref 0–44)
AST: 57 U/L — ABNORMAL HIGH (ref 15–41)
Albumin: 3.4 g/dL — ABNORMAL LOW (ref 3.5–5.0)
Alkaline Phosphatase: 90 U/L (ref 38–126)
Bilirubin, Direct: 0.7 mg/dL — ABNORMAL HIGH (ref 0.0–0.2)
Indirect Bilirubin: 2.4 mg/dL — ABNORMAL HIGH (ref 0.3–0.9)
Total Bilirubin: 3.1 mg/dL — ABNORMAL HIGH (ref 0.3–1.2)
Total Protein: 6.4 g/dL — ABNORMAL LOW (ref 6.5–8.1)

## 2018-12-29 LAB — LACTIC ACID, PLASMA
Lactic Acid, Venous: 5.7 mmol/L (ref 0.5–1.9)
Lactic Acid, Venous: 2.4 mmol/L (ref 0.5–1.9)

## 2018-12-29 LAB — ETHANOL: Alcohol, Ethyl (B): <10 mg/dL

## 2018-12-29 MED ORDER — SUCCINYLCHOLINE CHLORIDE 20 MG/ML IJ SOLN
120.0000 mg | Freq: Once | INTRAMUSCULAR | Status: AC
  Administered 2018-12-29: 120 mg via INTRAVENOUS

## 2018-12-29 MED ORDER — LORAZEPAM 2 MG/ML IJ SOLN
2.0000 mg | Freq: Once | INTRAMUSCULAR | Status: AC
  Administered 2018-12-29: 2 mg via INTRAVENOUS

## 2018-12-29 MED ORDER — LORAZEPAM 2 MG/ML IJ SOLN
INTRAMUSCULAR | Status: AC
  Administered 2018-12-29: 2 mg
  Filled 2018-12-29: qty 1

## 2018-12-29 MED ORDER — LORAZEPAM 2 MG/ML IJ SOLN
INTRAMUSCULAR | Status: AC
  Filled 2018-12-29: qty 1

## 2018-12-29 MED ORDER — LORAZEPAM 2 MG/ML IJ SOLN
INTRAMUSCULAR | Status: AC
  Filled 2018-12-29: qty 2

## 2018-12-29 MED ORDER — LORAZEPAM 2 MG/ML IJ SOLN
INTRAMUSCULAR | Status: AC
  Administered 2018-12-29: 20:00:00 2 mg
  Filled 2018-12-29: qty 1

## 2018-12-29 MED ORDER — SODIUM CHLORIDE 0.9 % IV BOLUS
1000.0000 mL | Freq: Once | INTRAVENOUS | Status: AC
  Administered 2018-12-29: 1000 mL via INTRAVENOUS

## 2018-12-29 MED ORDER — LEVETIRACETAM IN NACL 1500 MG/100ML IV SOLN
1500.0000 mg | Freq: Once | INTRAVENOUS | Status: AC
  Administered 2018-12-29: 21:00:00 1500 mg via INTRAVENOUS
  Filled 2018-12-29: qty 100

## 2018-12-29 MED ORDER — LACTULOSE 10 GM/15ML PO SOLN
30.0000 g | Freq: Once | ORAL | Status: AC
  Administered 2018-12-29: 30 g
  Filled 2018-12-29: qty 60

## 2018-12-29 MED ORDER — LEVETIRACETAM IN NACL 500 MG/100ML IV SOLN
500.0000 mg | Freq: Once | INTRAVENOUS | Status: AC
  Administered 2018-12-29: 500 mg via INTRAVENOUS
  Filled 2018-12-29: qty 100

## 2018-12-29 MED ORDER — ETOMIDATE 2 MG/ML IV SOLN
20.0000 mg | Freq: Once | INTRAVENOUS | Status: AC
  Administered 2018-12-29: 21:00:00 20 mg via INTRAVENOUS

## 2018-12-29 MED ORDER — PROPOFOL 1000 MG/100ML IV EMUL
5.0000 ug/kg/min | INTRAVENOUS | Status: DC
  Administered 2018-12-29: 10 ug/kg/min via INTRAVENOUS
  Filled 2018-12-29 (×2): qty 100

## 2018-12-29 MED ORDER — LEVETIRACETAM IN NACL 500 MG/100ML IV SOLN: 500.0000 mg | Freq: Once | INTRAVENOUS | Status: DC

## 2018-12-29 MED ORDER — SODIUM CHLORIDE 0.9 % IV SOLN
400.0000 mg | Freq: Once | INTRAVENOUS | Status: AC
  Administered 2018-12-29: 400 mg via INTRAVENOUS
  Filled 2018-12-29: qty 40

## 2018-12-29 NOTE — ED Notes (Signed)
Pt received 4mg  narcan via EMS

## 2018-12-29 NOTE — ED Notes (Signed)
Pt returned from CT, padding placed on siderails

## 2018-12-29 NOTE — ED Notes (Signed)
No further seizure activity noted since last ativan administration.

## 2018-12-29 NOTE — ED Notes (Signed)
EDP, Dr Archie Balboa, talking to Bethena Roys, pt's wife, [phone], att  POC updated

## 2018-12-29 NOTE — ED Notes (Signed)
Verbal permission from wife to transfer pt to [facility], verified by Dr Archie Balboa

## 2018-12-29 NOTE — ED Notes (Signed)
Waiting for confirmation of OG tube placement via xray prior to admin. Of lactulose

## 2018-12-29 NOTE — ED Notes (Signed)
Pt swabbed for COVID, specimen at bedside

## 2018-12-29 NOTE — ED Notes (Signed)
Pt began seizing

## 2018-12-29 NOTE — ED Notes (Signed)
Pt noted cleching jaws

## 2018-12-29 NOTE — ED Notes (Signed)
Pt to ct with RN assist.  

## 2018-12-29 NOTE — ED Provider Notes (Signed)
[facility] Emergency Department Provider Note   ____________________________________________   I have reviewed the triage vital signs and the nursing notes.   HISTORY  Chief Complaint Seizures   History limited by and level 5 caveat due to: Seizure/unresponsive  HPI Adrian Hale is a 58 y.o. male who presents to the emergency department today via EMS as emergency traffic.  Per report the patient was found down on the floor of his room by his wife.  They had an argument early in the morning so she had not checked on him for quite some time.  Patient does have a history of liver disease, cirrhosis as well as epilepsy.  Patient was seizing essentially upon arrival to the emergency department and is been unresponsive.   Records reviewed. Per medical record review patient has a history of seizures, liver disease.   Past Medical History:  Diagnosis Date  . ALC (alcoholic liver cirrhosis) ([place])   . Alcohol abuse    NONE SINCE 08/28/2011 AND ATTENDING AA MEETINGS  . Alcoholic cirrhosis of liver ([place])   . Alcoholic peripheral neuropathy ([place])   . Allergy to poison ivy 06/06/2013   RASH  . Anxiety   . Aortic aneurysm ([place])   . Cellulitis of both lower extremities   . Complex partial seizure evolving to generalized seizure ([place])   . COPD (chronic obstructive pulmonary disease) ([place])   . Depression    MAJOR DEPRESSIVE DISORDER  . ED (erectile dysfunction)   . Hepatic encephalopathy ([place])   . Hyperlipidemia   . Hypertension    ESSENTIAL  . Maculopathy   . Marijuana abuse   . MRSA (methicillin resistant staph aureus) culture positive   . Obesity   . Portal hypertension ([place])   . Pseudophakia of both eyes   . Ptosis of both eyelids   . S/P TIPS (transjugular intrahepatic portosystemic shunt)   . Seizures ([place])    Last seizure April 06, 2013, [facility]  . Seizures [facility])   . Sleep apnea    Does not use CPAP  . Thrombocytopenia, secondary   .  Tobacco abuse   . Toxic maculopathy of right eye   . Ulcer of lower limb ([place])    ABSCESS OR CELLULITIS OF LEG    Patient Active Problem List   Diagnosis Date Noted  . ALC (alcoholic liver cirrhosis) ([place])   . Seizures ([place])   . Tobacco abuse   . Hepatic encephalopathy ([place]) 12/02/2018    Past Surgical History:  Procedure Laterality Date  . BREAST SURGERY     BIOPSY  . CATARACT EXTRACTION Right 10/19/2011  . CATARACT EXTRACTION Left 02/22/2012  . COLONOSCOPY    . ESOPHAGOGASTRODUODENOSCOPY    . EYE SURGERY    . [place] Left 10/30/2014   Procedure: HALLUX VALGUS AUSTIN;  Surgeon: Samara Deist, DPM;  Location: [facility] ORS;  Service: Podiatry;  Laterality: Left;  . LIVER SURGERY     TIPS PROCEDURE  . None    . TIPS PROCEDURE      Prior to Admission medications   Medication Sig Start Date End Date Taking? Authorizing Provider  acetaminophen (TYLENOL) 325 MG tablet Take 650 mg by mouth every 4 (four) hours as needed.   Yes [provider]  albuterol (PROVENTIL HFA;VENTOLIN HFA) 108 (90 BASE) MCG/ACT inhaler Inhale 2 puffs into the lungs every 4 (four) hours as needed for wheezing or shortness of breath.   Yes [provider]  albuterol (PROVENTIL) (5 MG/ML)  0.5% nebulizer solution Take 2.5 mg by nebulization every 4 (four) hours as needed for wheezing or shortness of breath.   Yes [provider]  fluorouracil (EFUDEX) 5 % cream Apply 1 application topically 2 (two) times daily. 08/15/18  Yes [provider]  fluticasone (FLONASE) 50 MCG/ACT nasal spray Place 1 spray into both nostrils daily.   Yes [provider]  furosemide (LASIX) 20 MG tablet Take 20 mg by mouth 2 (two) times daily.   Yes [provider]  lactulose, encephalopathy, (ENULOSE) 10 GM/15ML SOLN Take 20 g by mouth 3 (three) times daily.    Yes [provider]  Melatonin-Pyridoxine (MELATIN PO) Take 1 tablet by mouth at bedtime as needed.   Yes  [provider]  propranolol (INDERAL) 20 MG tablet Take 20 mg by mouth daily.   Yes [provider]  rifaximin (XIFAXAN) 550 MG TABS tablet Take 550 mg by mouth 2 (two) times daily.   Yes [provider]  sertraline (ZOLOFT) 25 MG tablet Take 25 mg by mouth daily. 10/24/18  Yes [provider]  spironolactone (ALDACTONE) 50 MG tablet Take 50 mg by mouth 2 (two) times daily.   Yes [provider]  tizanidine (ZANAFLEX) 2 MG capsule Take 2 mg by mouth 3 (three) times daily as needed for muscle spasms.   Yes [provider]  traMADol (ULTRAM) 50 MG tablet Take 50 mg by mouth 3 (three) times daily as needed. 10/31/18  Yes [provider]  traZODone (DESYREL) 100 MG tablet Take 300 mg by mouth at bedtime. PATIENT TAKES 3 TABLETS   Yes [provider]  esomeprazole (NEXIUM) 40 MG capsule Take 40 mg by mouth daily.     [provider]  fluticasone (FLOVENT HFA) 110 MCG/ACT inhaler Inhale 1 puff into the lungs 2 (two) times daily.    [provider]  ketoconazole (NIZORAL) 2 % cream Apply 1 application topically 2 (two) times daily as needed.     [provider]  levETIRAcetam (KEPPRA XR) 500 MG 24 hr tablet Take 500 mg by mouth 2 (two) times daily.    [provider]  MILK THISTLE PO Take 1 capsule by mouth at bedtime.     [provider]  tadalafil (CIALIS) 20 MG tablet Take 20 mg by mouth daily as needed for erectile dysfunction.    [provider]    Allergies Poison ivy extract, Poison oak extract, and Poison ivy extract [poison ivy extract]  Family History  Problem Relation Age of Onset  . Hypertension Mother   . Hypertension Father     Social History Social History   Tobacco Use  . Smoking status: Current Every Day Smoker    Packs/day: 0.50    Years: 15.00    Pack years: 7.50    Types: Cigarettes  . Smokeless tobacco: Never Used  Substance Use Topics  .  Alcohol use: Yes    Comment: Twice a month.  He used to drink heavily in the past.  . Drug use: Yes    Types: Marijuana    Review of Systems Unable to obtain secondary to medical condition.  ____________________________________________   PHYSICAL EXAM:  VITAL SIGNS: ED Triage Vitals  Enc Vitals Group     BP 12/29/18 2022 (!) 160/85     Pulse Rate 12/29/18 2016 82     Resp 12/29/18 2016 (!) 24     Temp 12/29/18 2016 97.8 F (36.6 C)  Temp Source 12/29/18 2016 Axillary     SpO2 12/29/18 2012 97 %     Weight 12/29/18 2020 220 lb 7.4 oz (100 kg)     Height 12/29/18 2020 5\' 9"  (1.753 m)   Constitutional: Unresponsive. Seizing.  Eyes: Conjunctivae are normal.  ENT      Head: Normocephalic and atraumatic.      Nose: No congestion/rhinnorhea.      Mouth/Throat: Mucous membranes are moist.      Neck: No stridor. Hematological/Lymphatic/Immunilogical: No cervical lymphadenopathy. Cardiovascular: Tachycardic. Regular rhythm.  Respiratory: Breath sounds without crackles, wheezing or rhonchi.  Gastrointestinal: Soft and non tender. No rebound. No guarding.  Genitourinary: Deferred Musculoskeletal: Normal range of motion in all extremities. No lower extremity edema. Neurologic:  Seizing. Generalized tonic clonic. Right gaze deviation.  Skin:  Skin is warm, dry and intact. No rash noted. ____________________________________________    LABS (pertinent positives/negatives)  CBC wbc 12.2, hgb 14.1, plt 154 BMP wnl except co2 20, glu 150, ca 8.7 Ammonia 93 Ethanol <10 UDS positive tricyclic, amphetamines, cannabinoid, benzo   ____________________________________________   EKG  I, Nance Pear, attending physician, personally viewed and interpreted this EKG  EKG Time: 2014 Rate: 78 Rhythm: sinus rhythm Axis: normal Intervals: qtc 514 QRS: nonspecific intraventricular conduction delay ST changes: no st elevation Impression: abnormal  ekg  ____________________________________________    RADIOLOGY  CT head/cervical spine No intracranial abnormality. No obvious osseous injury  CXR ET tube terminates 6.2 cm above carina ____________________________________________   PROCEDURES  Procedures  INTUBATION Performed by: Nance Pear  Required items: required blood products, implants, devices, and special equipment available Patient identity confirmed: provided demographic data and hospital-assigned identification number Time out: Immediately prior to procedure a "time out" was called to verify the correct patient, procedure, equipment, support staff and site/side marked as required.  Indications: Status epilepticus  Intubation method: Glidescope   Preoxygenation: BVM  Sedatives: Etomidate Paralytic: Succinylcholine  Tube Size: 7.5 cuffed  Post-procedure assessment: chest rise and ETCO2 monitor Breath sounds: equal and absent over the epigastrium Tube secured with: ETT holder Chest x-ray interpreted by radiologist and me.  Chest x-ray findings: endotracheal tube above the carina  Patient tolerated the procedure well with no immediate complications.   CRITICAL CARE Performed by: Nance Pear   Total critical care time: 45 minutes  Critical care time was exclusive of separately billable procedures and treating other patients.  Critical care was necessary to treat or prevent imminent or life-threatening deterioration.  Critical care was time spent personally by me on the following activities: development of treatment plan with patient and/or surrogate as well as nursing, discussions with consultants, evaluation of patient's response to treatment, examination of patient, obtaining history from patient or surrogate, ordering and performing treatments and interventions, ordering and review of laboratory studies, ordering and review of radiographic studies, pulse oximetry and re-evaluation of  patient's condition.  ____________________________________________   INITIAL IMPRESSION / ASSESSMENT AND PLAN / ED COURSE  Pertinent labs & imaging results that were available during my care of the patient were reviewed by me and considered in my medical decision making (see chart for details).   Patient presented to the emergency department via EMS after being found down on the ground at his house.  Patient arrived he was having generalized tonic-clonic seizures.  He did have right gaze deviation.  He does have a history of epilepsy.  He was given initially 6 mg of Ativan which seemed to help with his tonic-clonic movement.  Stat  head CT was obtained which did not show any intracranial bleed.  Upon return to the room patient continued to have seizures.  He was given 2 more milligrams of Ativan as well as 1500 mg of Keppra.  Continue to have seizures.  At this time the decision was made to intubate the patient.  Post intubation chest x-ray did show good intratracheal placement.  He was given additional 500 mg of Keppra and 40 mg of Vimpat.  Given concern for subclinical seizures patient was arranged to be transferred to [facility] for neurological ICU.  I discussed the findings and plan for transfer with the patient's wife.  ____________________________________________   FINAL CLINICAL IMPRESSION(S) / ED DIAGNOSES  Final diagnoses:  Status epilepticus ([place])     Note: This dictation was prepared with Dragon dictation. Any transcriptional errors that result from this process are unintentional     Nance Pear, MD 12/29/18 [phone]

## 2018-12-29 NOTE — ED Notes (Signed)
Pt with seizure like activity noted. md at notified, order for additional ativan received.

## 2018-12-29 NOTE — ED Notes (Addendum)
Pt intubated by dr. Archie Balboa. 25 at lip, center tube placement, breath sounds auscultated clear x4 lobes.

## 2018-12-29 NOTE — ED Triage Notes (Addendum)
Pt arrives from home with seizures. Per ems pt was unresponsive at scene, became combative enroute, then had another seizure, with right sided eye deviation. Ems gave 0.4mg  narcan on scene. Pt with history of etoh, liver failure, cirrhosis. Pt with multiple abrasions noted on toes, knees. Dried blood noted on lips. Pt is not responsive with exception of moving arm during painful stimuli. Pt bagged on arrival with ems, however, chest rise and fall noted independently with resp rate 22.

## 2018-12-30 LAB — LACTIC ACID, PLASMA: Lactic Acid, Venous: 1.5 mmol/L (ref 0.5–1.9)

## 2018-12-30 MED ORDER — SODIUM CHLORIDE 0.9 % IV BOLUS
1000.0000 mL | Freq: Once | INTRAVENOUS | Status: AC
  Administered 2018-12-30: 02:00:00 1000 mL via INTRAVENOUS

## 2018-12-30 NOTE — ED Notes (Signed)
Bethena Roys, Wife, called to update for pt leaving and [facility] ICU visitation hours

## 2018-12-30 NOTE — ED Notes (Signed)
Pt biting ETT during report to [facility]

## 2018-12-30 NOTE — ED Notes (Signed)
Life [facility] is here, reports to [place] and [place]

## 2019-01-01 MED ORDER — GENERIC EXTERNAL MEDICATION
200.00 | Status: DC
Start: 2019-01-01 — End: 2019-01-01

## 2019-01-01 MED ORDER — GENERIC EXTERNAL MEDICATION
1500.00 | Status: DC
Start: 2019-01-01 — End: 2019-01-01

## 2019-01-01 MED ORDER — LACTULOSE 10 GM/15ML PO SOLN
30.00 | ORAL | Status: DC
Start: 2019-01-01 — End: 2019-01-01

## 2019-01-01 MED ORDER — HEPARIN SODIUM (PORCINE) 5000 UNIT/ML IJ SOLN
5000.00 | INTRAMUSCULAR | Status: DC
Start: 2019-01-01 — End: 2019-01-01

## 2019-01-01 MED ORDER — THIAMINE HCL 100 MG/ML IJ SOLN
200.00 | INTRAMUSCULAR | Status: DC
Start: 2019-01-02 — End: 2019-01-01

## 2019-01-01 MED ORDER — DEXTROSE 10 % IV SOLN
50.00 | INTRAVENOUS | Status: DC
Start: ? — End: 2019-01-01

## 2019-01-01 MED ORDER — FOLIC ACID 5 MG/ML IJ SOLN
1.00 | INTRAMUSCULAR | Status: DC
Start: 2019-01-02 — End: 2019-01-01

## 2019-01-01 MED ORDER — DEXMEDETOMIDINE HCL IN NACL 400 MCG/100ML IV SOLN
0.20 | INTRAVENOUS | Status: DC
Start: ? — End: 2019-01-01

## 2019-01-01 MED ORDER — FAMOTIDINE 20 MG/2ML IV SOLN
20.00 | INTRAVENOUS | Status: DC
Start: 2019-01-02 — End: 2019-01-01

## 2019-01-01 MED ORDER — LABETALOL HCL 5 MG/ML IV SOLN
10.00 | INTRAVENOUS | Status: DC
Start: ? — End: 2019-01-01

## 2019-01-01 MED ORDER — FENTANYL CITRATE (PF) 50 MCG/ML IJ SOLN
12.50 | INTRAMUSCULAR | Status: DC
Start: ? — End: 2019-01-01

## 2019-01-01 MED ORDER — FUROSEMIDE 10 MG/ML IJ SOLN
20.00 | INTRAMUSCULAR | Status: DC
Start: 2019-01-01 — End: 2019-01-01

## 2019-01-01 MED ORDER — ACETAMINOPHEN 325 MG PO TABS
650.00 | ORAL_TABLET | ORAL | Status: DC
Start: ? — End: 2019-01-01

## 2019-01-01 MED ORDER — SPIRONOLACTONE 50 MG PO TABS
50.00 | ORAL_TABLET | ORAL | Status: DC
Start: 2019-01-01 — End: 2019-01-01

## 2019-01-01 MED ORDER — GENERIC EXTERNAL MEDICATION
Status: DC
Start: ? — End: 2019-01-01

## 2019-01-01 MED ORDER — LACTULOSE 10 GM/15ML PO SOLN
300.00 | ORAL | Status: DC
Start: 2019-01-01 — End: 2019-01-01

## 2019-01-01 MED ORDER — RIFAXIMIN 550 MG PO TABS
550.00 | ORAL_TABLET | ORAL | Status: DC
Start: 2019-01-01 — End: 2019-01-01

## 2019-01-01 MED ORDER — ONDANSETRON HCL 4 MG/2ML IJ SOLN
4.00 | INTRAMUSCULAR | Status: DC
Start: ? — End: 2019-01-01

## 2019-01-01 MED ORDER — PROPRANOLOL HCL 20 MG PO TABS
20.00 | ORAL_TABLET | ORAL | Status: DC
Start: 2019-01-02 — End: 2019-01-01

## 2019-01-01 MED ORDER — HYDRALAZINE HCL 20 MG/ML IJ SOLN
10.00 | INTRAMUSCULAR | Status: DC
Start: ? — End: 2019-01-01

## 2019-01-01 MED ORDER — NORMOSOL-R IV SOLN
INTRAVENOUS | Status: DC
Start: ? — End: 2019-01-01

## 2019-03-17 ENCOUNTER — Other Ambulatory Visit: Payer: Self-pay

## 2019-03-17 ENCOUNTER — Emergency Department
Admission: EM | Admit: 2019-03-17 | Discharge: 2019-03-17 | Disposition: A | Discharge status: Home / Self Care | Payer: Medicare HMO | Attending: Emergency Medicine | Admitting: Emergency Medicine

## 2019-03-17 DIAGNOSIS — F17210 Nicotine dependence, cigarettes, uncomplicated: Secondary | ICD-10-CM | POA: Diagnosis not present

## 2019-03-17 DIAGNOSIS — G40909 Epilepsy, unspecified, not intractable, without status epilepticus: Secondary | ICD-10-CM

## 2019-03-17 DIAGNOSIS — J449 Chronic obstructive pulmonary disease, unspecified: Secondary | ICD-10-CM | POA: Insufficient documentation

## 2019-03-17 DIAGNOSIS — Z79899 Other long term (current) drug therapy: Secondary | ICD-10-CM | POA: Insufficient documentation

## 2019-03-17 DIAGNOSIS — I10 Essential (primary) hypertension: Secondary | ICD-10-CM | POA: Diagnosis not present

## 2019-03-17 DIAGNOSIS — R569 Unspecified convulsions: Secondary | ICD-10-CM | POA: Diagnosis present

## 2019-03-17 LAB — CBC
HCT: 35.1 % — ABNORMAL LOW (ref 39.0–52.0)
Hemoglobin: 12.3 g/dL — ABNORMAL LOW (ref 13.0–17.0)
MCH: 35.1 pg — ABNORMAL HIGH (ref 26.0–34.0)
MCHC: 35 g/dL (ref 30.0–36.0)
MCV: 100.3 fL — ABNORMAL HIGH (ref 80.0–100.0)
Platelets: 124 10*3/uL — ABNORMAL LOW (ref 150–400)
RBC: 3.5 MIL/uL — ABNORMAL LOW (ref 4.22–5.81)
RDW: 13.3 % (ref 11.5–15.5)
WBC: 6.5 10*3/uL (ref 4.0–10.5)
nRBC: 0 % (ref 0.0–0.2)

## 2019-03-17 LAB — HEPATIC FUNCTION PANEL
ALT: 40 U/L (ref 0–44)
AST: 46 U/L — ABNORMAL HIGH (ref 15–41)
Albumin: 3.1 g/dL — ABNORMAL LOW (ref 3.5–5.0)
Alkaline Phosphatase: 97 U/L (ref 38–126)
Bilirubin, Direct: 0.4 mg/dL — ABNORMAL HIGH (ref 0.0–0.2)
Indirect Bilirubin: 1.2 mg/dL — ABNORMAL HIGH (ref 0.3–0.9)
Total Bilirubin: 1.6 mg/dL — ABNORMAL HIGH (ref 0.3–1.2)
Total Protein: 5.8 g/dL — ABNORMAL LOW (ref 6.5–8.1)

## 2019-03-17 LAB — BASIC METABOLIC PANEL
Anion gap: 8 (ref 5–15)
BUN: <5 mg/dL — ABNORMAL LOW (ref 6–20)
CO2: 23 mmol/L (ref 22–32)
Calcium: 8.3 mg/dL — ABNORMAL LOW (ref 8.9–10.3)
Chloride: 108 mmol/L (ref 98–111)
Creatinine, Ser: 0.53 mg/dL — ABNORMAL LOW (ref 0.61–1.24)
GFR calc Af Amer: >60 mL/min (ref >60)
GFR calc non Af Amer: >60 mL/min (ref >60)
Glucose, Bld: 85 mg/dL (ref 70–99)
Potassium: 3.6 mmol/L (ref 3.5–5.1)
Sodium: 139 mmol/L (ref 135–145)

## 2019-03-17 LAB — AMMONIA: Ammonia: 46 umol/L — ABNORMAL HIGH (ref 9–35)

## 2019-03-17 NOTE — ED Triage Notes (Signed)
Pt comes EMS with a witnessed seizure by wife about 30 to 60 seconds. Pt incontinent during seizure. Remains post-ictial and confused. Pt has hx epilepsy. CBG 118. 20G IV started by EMS.

## 2019-03-17 NOTE — ED Notes (Signed)
Wife at bedside due to pt confusion.

## 2019-03-17 NOTE — ED Notes (Signed)
Pt urinated and had a BM on himself during seizure. Pt was cleaned up with warm wipes and was able to help clean himself. Pt given clean underwear.

## 2019-03-17 NOTE — ED Provider Notes (Signed)
[facility] Emergency Department Provider Note ____________________________________________   First MD Initiated Contact with Patient 03/17/19 1142     (approximate)  I have reviewed the triage vital signs and the nursing notes.   HISTORY  Chief Complaint Seizures  Level 5 caveat: History present illness limited due to altered mental status  HPI Adrian Hale is a 59 y.o. male with PMH as noted below including seizure disorder, COPD, and alcohol abuse who presents after an apparent seizure.  It was witnessed by his wife and lasted under 1 minute.  He had incontinence of urine.  The patient states he just remembers that he lost consciousness and there were standing around him afterwards.  He is unable to give any other history.  He denies any pain at this time.  Past Medical History:  Diagnosis Date  . ALC (alcoholic liver cirrhosis) ([place])   . Alcohol abuse    NONE SINCE 08/28/2011 AND ATTENDING AA MEETINGS  . Alcoholic cirrhosis of liver ([place])   . Alcoholic peripheral neuropathy ([place])   . Allergy to poison ivy 06/06/2013   RASH  . Anxiety   . Aortic aneurysm ([place])   . Cellulitis of both lower extremities   . 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 COPD (chronic obstructive pulmonary disease) ([place])   . Depression    MAJOR DEPRESSIVE DISORDER  . ED (erectile dysfunction)   . Hepatic encephalopathy ([place])   . Hyperlipidemia   . Hypertension    ESSENTIAL  . Maculopathy   . Marijuana abuse   . MRSA (methicillin resistant staph aureus) culture positive   . Obesity   . Portal hypertension ([place])   . Pseudophakia of both eyes   . Ptosis of both eyelids   . S/P TIPS (transjugular intrahepatic portosystemic shunt)   . Seizures ([place])    Last seizure April 06, 2013, [facility]  . Seizures [facility])   . Sleep apnea    Does not use CPAP  . Thrombocytopenia, secondary   . Tobacco abuse   . Toxic maculopathy of right eye     . Ulcer of lower limb ([place])    ABSCESS OR CELLULITIS OF LEG    Patient Active Problem List   Diagnosis Date Noted  . ALC (alcoholic liver cirrhosis) ([place])   . Seizures ([place])   . Tobacco abuse   . Hepatic encephalopathy ([place]) 12/02/2018    Past Surgical History:  Procedure Laterality Date  . BREAST SURGERY     BIOPSY  . CATARACT EXTRACTION Right 10/19/2011  . CATARACT EXTRACTION Left 02/22/2012  . COLONOSCOPY    . ESOPHAGOGASTRODUODENOSCOPY    . EYE SURGERY    . [place] Left 10/30/2014   Procedure: HALLUX VALGUS AUSTIN;  Surgeon: Samara Deist, DPM;  Location: [facility] ORS;  Service: Podiatry;  Laterality: Left;  . LIVER SURGERY     TIPS PROCEDURE  . None    . TIPS PROCEDURE      Prior to Admission medications   Medication Sig Start Date End Date Taking? Authorizing Provider  acetaminophen (TYLENOL) 325 MG tablet Take 650 mg by mouth every 4 (four) hours as needed.    [provider]  albuterol (PROVENTIL HFA;VENTOLIN HFA) 108 (90 BASE) MCG/ACT inhaler Inhale 2 puffs into the lungs every 4 (four) hours as needed for wheezing or shortness of breath.    [provider]  albuterol (PROVENTIL) (5 MG/ML) 0.5% nebulizer solution Take 2.5 mg by nebulization every  4 (four) hours as needed for wheezing or shortness of breath.    [provider]  esomeprazole (NEXIUM) 40 MG capsule Take 40 mg by mouth daily.     [provider]  fluorouracil (EFUDEX) 5 % cream Apply 1 application topically 2 (two) times daily. 08/15/18   [provider]  fluticasone (FLONASE) 50 MCG/ACT nasal spray Place 1 spray into both nostrils daily.    [provider]  fluticasone (FLOVENT HFA) 110 MCG/ACT inhaler Inhale 1 puff into the lungs 2 (two) times daily.    [provider]  furosemide (LASIX) 20 MG tablet Take 20 mg by mouth 2 (two) times daily.    [provider]  ketoconazole (NIZORAL) 2 % cream Apply 1 application topically 2  (two) times daily as needed.     [provider]  lactulose, encephalopathy, (ENULOSE) 10 GM/15ML SOLN Take 20 g by mouth 3 (three) times daily.     [provider]  levETIRAcetam (KEPPRA XR) 500 MG 24 hr tablet Take 500 mg by mouth 2 (two) times daily.    [provider]  Melatonin-Pyridoxine (MELATIN PO) Take 1 tablet by mouth at bedtime as needed.    [provider]  MILK THISTLE PO Take 1 capsule by mouth at bedtime.     [provider]  propranolol (INDERAL) 20 MG tablet Take 20 mg by mouth daily.    [provider]  rifaximin (XIFAXAN) 550 MG TABS tablet Take 550 mg by mouth 2 (two) times daily.    [provider]  sertraline (ZOLOFT) 25 MG tablet Take 25 mg by mouth daily. 10/24/18   [provider]  spironolactone (ALDACTONE) 50 MG tablet Take 50 mg by mouth 2 (two) times daily.    [provider]  tadalafil (CIALIS) 20 MG tablet Take 20 mg by mouth daily as needed for erectile dysfunction.    [provider]  tizanidine (ZANAFLEX) 2 MG capsule Take 2 mg by mouth 3 (three) times daily as needed for muscle spasms.    [provider]  traMADol (ULTRAM) 50 MG tablet Take 50 mg by mouth 3 (three) times daily as needed. 10/31/18   [provider]  traZODone (DESYREL) 100 MG tablet Take 300 mg by mouth at bedtime. PATIENT TAKES 3 TABLETS    [provider]    Allergies Poison ivy extract, Poison oak extract, and Poison ivy extract [poison ivy extract]  Family History  Problem Relation Age of Onset  . Hypertension Mother   . Hypertension Father     Social History Social History   Tobacco Use  . Smoking status: Current Every Day Smoker    Packs/day: 0.50    Years: 15.00    Pack years: 7.50    Types: Cigarettes  . Smokeless tobacco: Never Used  Substance Use Topics  . Alcohol use: Yes    Comment: Twice a month.  He used to drink heavily in the past.  . Drug use: Yes     Types: Marijuana    Review of Systems Level 5 caveat: Unable to 10 review of systems due to altered mental status    ____________________________________________   PHYSICAL EXAM:  VITAL SIGNS: ED Triage Vitals  Enc Vitals Group     BP 03/17/19 1129 125/71     Pulse Rate 03/17/19 1126 (!) 51     Resp 03/17/19 1126 18     Temp 03/17/19 1126 97.7 F (36.5 C)     Temp  Source 03/17/19 1126 Oral     SpO2 03/17/19 1126 97 %     Weight 03/17/19 1127 210 lb (95.3 kg)     Height 03/17/19 1127 6' (1.829 m)     Head Circumference --      Peak Flow --      Pain Score 03/17/19 1127 0     Pain Loc --      Pain Edu? --      Excl. in [place]? --     Constitutional: Alert, confused.  Relatively well appearing and in no acute distress. Eyes: Conjunctivae are normal.  EOMI.  PERRLA. Head: Atraumatic. Nose: No congestion/rhinnorhea. Mouth/Throat: Mucous membranes are moist.   Neck: Normal range of motion.  Cardiovascular: Normal rate, regular rhythm. Grossly normal heart sounds.  Good peripheral circulation. Respiratory: Normal respiratory effort.  No retractions. Lungs CTAB. Gastrointestinal: No distention.  Musculoskeletal:  Extremities warm and well perfused.  Neurologic:  Normal speech and language.  Motor and sensory intact in all extremities. Skin:  Skin is warm and dry. No rash noted. Psychiatric: Calm and cooperative.  ____________________________________________   LABS (all labs ordered are listed, but only abnormal results are displayed)  Labs Reviewed  CBC - Abnormal; Notable for the following components:      Result Value   RBC 3.50 (*)    Hemoglobin 12.3 (*)    HCT 35.1 (*)    MCV 100.3 (*)    MCH 35.1 (*)    Platelets 124 (*)    All other components within normal limits  BASIC METABOLIC PANEL - Abnormal; Notable for the following components:   BUN <5 (*)    Creatinine, Ser 0.53 (*)    Calcium 8.3 (*)    All other components within normal limits  HEPATIC  FUNCTION PANEL - Abnormal; Notable for the following components:   Total Protein 5.8 (*)    Albumin 3.1 (*)    AST 46 (*)    Total Bilirubin 1.6 (*)    Bilirubin, Direct 0.4 (*)    Indirect Bilirubin 1.2 (*)    All other components within normal limits  AMMONIA - Abnormal; Notable for the following components:   Ammonia 46 (*)    All other components within normal limits  CBG MONITORING, ED   ____________________________________________  EKG  ED ECG REPORT I, Arta Silence, the attending physician, personally viewed and interpreted this ECG.  Date: 03/17/2019 EKG Time: 1133 Rate: 52 Rhythm: normal sinus rhythm QRS Axis: normal Intervals: Nonspecific IVCD  ST/T Wave abnormalities: normal Narrative Interpretation: no evidence of acute ischemia  ____________________________________________  RADIOLOGY    ____________________________________________   PROCEDURES  Procedure(s) performed: No  Procedures  Critical Care performed: No ____________________________________________   INITIAL IMPRESSION / ASSESSMENT AND PLAN / ED COURSE  Pertinent labs & imaging results that were available during my care of the patient were reviewed by me and considered in my medical decision making (see chart for details).  59 year old male with PMH as noted above including a history of a seizure disorder presents after apparent seizure witnessed by his wife.  The patient is confused and apparently postictal.  He does not clearly remember what happened and is unable to give any history.  I reviewed the past medical records in [place].  The patient was last admitted in November of last year with altered mental status and hepatic encephalopathy.  He was subsequently seen in December after a seizure, with status epilepticus requiring intubation.  He was transferred to [facility]  neuro ICU.  He has a history of liver cirrhosis and a cerebral cavernous malformation.  On exam today, the patient is  alert but confused.  He is able to answer some questions and follow commands.  His vital signs are normal.  Neurologic exam is nonfocal.  The remainder of the exam is unremarkable.  Presentation is consistent with a postictal state after a seizure.  Per the records in [place], the patient is on [place].  Differential for the altered mental status also includes hepatic encephalopathy.  There is no clinical evidence to suggest intracranial hemorrhage.  We will obtain labs including an ammonia level and observe the patient for return to baseline.  ----------------------------------------- 2:16 PM on 03/17/2019 -----------------------------------------  The patient is now alert and oriented x4.  His wife is now here and confirms that he is at his baseline.  She reports that they were at a thrift store together, when the patient suddenly stiffened up and stopped responding.  He then appeared confused.  He had incontinence at that time.  His last seizure was the one several months ago when he was admitted.  He follows with Dr. Manuella Ghazi from neurology.  He reports that he has been compliant with his medication although may have missed a dose a few days ago, and states that he slept poorly last night which he thinks may have been the trigger.  The patient's ammonia is 46, which is in a good range for him.  His LFTs are improved from recent prior labs.  At this time, he is stable for discharge home.  I counseled him on the results of the work-up.  I instructed him to be diligent about his medication doses and to contact Dr. Trena Platt office for follow-up.  Return precautions provided, and he expresses understanding.  ____________________________________________   FINAL CLINICAL IMPRESSION(S) / ED DIAGNOSES  Final diagnoses:  Seizure disorder ([place])      NEW MEDICATIONS STARTED DURING THIS VISIT:  New Prescriptions   No medications on file     Note:  This document was prepared using Dragon  voice recognition software and may include unintentional dictation errors.    Arta Silence, MD 03/17/19 [phone]

## 2019-03-17 NOTE — Discharge Instructions (Addendum)
Continue take your seizure medications as prescribed, and make sure you are getting enough sleep and eating and drinking regularly.  Call Dr. Trena Platt office on Monday to arrange for follow-up and any medication changes that might be needed.  Return to the ER for new, worsening, recurrent seizures, weakness, confusion, severe headache, or any other new or worsening symptoms that concern you.

## 2019-04-09 ENCOUNTER — Emergency Department: Payer: Medicare HMO

## 2019-04-09 ENCOUNTER — Other Ambulatory Visit: Payer: Self-pay

## 2019-04-09 ENCOUNTER — Encounter: Payer: Self-pay | Admitting: Emergency Medicine

## 2019-04-09 ENCOUNTER — Emergency Department
Admission: EM | Admit: 2019-04-09 | Discharge: 2019-04-09 | Disposition: A | Discharge status: Home / Self Care | Payer: Medicare HMO | Attending: Emergency Medicine | Admitting: Emergency Medicine

## 2019-04-09 DIAGNOSIS — F1210 Cannabis abuse, uncomplicated: Secondary | ICD-10-CM | POA: Diagnosis not present

## 2019-04-09 DIAGNOSIS — I10 Essential (primary) hypertension: Secondary | ICD-10-CM | POA: Insufficient documentation

## 2019-04-09 DIAGNOSIS — F17210 Nicotine dependence, cigarettes, uncomplicated: Secondary | ICD-10-CM | POA: Insufficient documentation

## 2019-04-09 DIAGNOSIS — Y939 Activity, unspecified: Secondary | ICD-10-CM | POA: Diagnosis not present

## 2019-04-09 DIAGNOSIS — W19XXXA Unspecified fall, initial encounter: Secondary | ICD-10-CM | POA: Diagnosis not present

## 2019-04-09 DIAGNOSIS — J449 Chronic obstructive pulmonary disease, unspecified: Secondary | ICD-10-CM | POA: Diagnosis not present

## 2019-04-09 DIAGNOSIS — Y999 Unspecified external cause status: Secondary | ICD-10-CM | POA: Insufficient documentation

## 2019-04-09 DIAGNOSIS — S63501A Unspecified sprain of right wrist, initial encounter: Secondary | ICD-10-CM | POA: Insufficient documentation

## 2019-04-09 DIAGNOSIS — S20212A Contusion of left front wall of thorax, initial encounter: Secondary | ICD-10-CM | POA: Diagnosis not present

## 2019-04-09 DIAGNOSIS — Y92018 Other place in single-family (private) house as the place of occurrence of the external cause: Secondary | ICD-10-CM | POA: Diagnosis not present

## 2019-04-09 DIAGNOSIS — Z79899 Other long term (current) drug therapy: Secondary | ICD-10-CM | POA: Insufficient documentation

## 2019-04-09 DIAGNOSIS — S6991XA Unspecified injury of right wrist, hand and finger(s), initial encounter: Secondary | ICD-10-CM | POA: Diagnosis present

## 2019-04-09 MED ORDER — HYDROCODONE-ACETAMINOPHEN 5-325 MG PO TABS: 1.0000 | ORAL_TABLET | ORAL | 0 refills | Status: DC | PRN

## 2019-04-09 MED ORDER — HYDROCODONE-ACETAMINOPHEN 5-325 MG PO TABS
1.0000 | ORAL_TABLET | Freq: Once | ORAL | Status: AC
  Administered 2019-04-09: 1 via ORAL
  Filled 2019-04-09: qty 1

## 2019-04-09 MED ORDER — MELOXICAM 15 MG PO TABS: 15.0000 mg | ORAL_TABLET | Freq: Every day | ORAL | 0 refills | Status: DC

## 2019-04-09 MED ORDER — MELOXICAM 7.5 MG PO TABS
15.0000 mg | ORAL_TABLET | Freq: Once | ORAL | Status: AC
  Administered 2019-04-09: 15 mg via ORAL
  Filled 2019-04-09: qty 2

## 2019-04-09 NOTE — ED Notes (Addendum)
See triage note  Presents with pain to right wrist   States he fell in attic  Good pulse  Questionable deformity  Also states he fell couple of days ago while playing soccer having some discomfort in chest esp with movement

## 2019-04-09 NOTE — ED Provider Notes (Signed)
[facility] Emergency Department Provider Note  ____________________________________________  Time seen: Approximately 4:03 PM  I have reviewed the triage vital signs and the nursing notes.   HISTORY  Chief Complaint Wrist Pain    HPI Adrian Hale is a 59 y.o. male who presents to the emergency department requesting evaluation for 2 separate injuries.  Patient's primary complaint was he was in his attic today, tripped while trying to hand an item down to his wife.  Patient states that he fell landing on his right arm.  He states that his wrist was "twisted" underneath him when he landed.  Patient did not fall through the ceiling or through the opening.  Patient was complaining of ongoing wrist pain.   Patient with no loss of sensation.  He denies any elbow or shoulder pain.  Patient also wants to be evaluated for left rib pain.  He states that he was playing soccer with a neighborhood child, tripped and fell landing on his wrist.  Patient is complaining of left anterior chest wall pain x1 week.  No difficulty breathing, shortness of breath.  He did not hit his head or lose consciousness on either injury.  No medications for these complaints prior to arrival.  Patient has a history as described below with alcoholic abuse, alcoholic liver cirrhosis, aortic aneurysm, seizures, sleep apnea, depression.  No complaints of chronic medical problems.        Past Medical History:  Diagnosis Date  . ALC (alcoholic liver cirrhosis) ([place])   . Alcohol abuse    NONE SINCE 08/28/2011 AND ATTENDING AA MEETINGS  . Alcoholic cirrhosis of liver ([place])   . Alcoholic peripheral neuropathy ([place])   . Allergy to poison ivy 06/06/2013   RASH  . Anxiety   . Aortic aneurysm ([place])   . Cellulitis of both lower extremities   . Complex partial seizure evolving to generalized seizure ([place])   . COPD (chronic obstructive pulmonary disease) ([place])   . Depression    MAJOR DEPRESSIVE  DISORDER  . ED (erectile dysfunction)   . Hepatic encephalopathy ([place])   . Hyperlipidemia   . Hypertension    ESSENTIAL  . Maculopathy   . Marijuana abuse   . MRSA (methicillin resistant staph aureus) culture positive   . Obesity   . Portal hypertension ([place])   . Pseudophakia of both eyes   . Ptosis of both eyelids   . S/P TIPS (transjugular intrahepatic portosystemic shunt)   . Seizures ([place])    Last seizure April 06, 2013, [facility]  . Seizures [facility])   . Sleep apnea    Does not use CPAP  . Thrombocytopenia, secondary   . Tobacco abuse   . Toxic maculopathy of right eye   . Ulcer of lower limb ([place])    ABSCESS OR CELLULITIS OF LEG    Patient Active Problem List   Diagnosis Date Noted  . ALC (alcoholic liver cirrhosis) ([place])   . Seizures ([place])   . Tobacco abuse   . Hepatic encephalopathy ([place]) 12/02/2018    Past Surgical History:  Procedure Laterality Date  . BREAST SURGERY     BIOPSY  . CATARACT EXTRACTION Right 10/19/2011  . CATARACT EXTRACTION Left 02/22/2012  . COLONOSCOPY    . ESOPHAGOGASTRODUODENOSCOPY    . EYE SURGERY    . [place] Left 10/30/2014   Procedure: HALLUX VALGUS AUSTIN;  Surgeon: Samara Deist, DPM;  Location: [facility] ORS;  Service: Podiatry;  Laterality: Left;  . LIVER  SURGERY     TIPS PROCEDURE  . None    . TIPS PROCEDURE      Prior to Admission medications   Medication Sig Start Date End Date Taking? Authorizing Provider  acetaminophen (TYLENOL) 325 MG tablet Take 650 mg by mouth every 4 (four) hours as needed.    [provider]  albuterol (PROVENTIL HFA;VENTOLIN HFA) 108 (90 BASE) MCG/ACT inhaler Inhale 2 puffs into the lungs every 4 (four) hours as needed for wheezing or shortness of breath.    [provider]  albuterol (PROVENTIL) (5 MG/ML) 0.5% nebulizer solution Take 2.5 mg by nebulization every 4 (four) hours as needed for wheezing or shortness of breath.    [provider]  esomeprazole  (NEXIUM) 40 MG capsule Take 40 mg by mouth daily.     [provider]  fluorouracil (EFUDEX) 5 % cream Apply 1 application topically 2 (two) times daily. 08/15/18   [provider]  fluticasone (FLONASE) 50 MCG/ACT nasal spray Place 1 spray into both nostrils daily.    [provider]  fluticasone (FLOVENT HFA) 110 MCG/ACT inhaler Inhale 1 puff into the lungs 2 (two) times daily.    [provider]  furosemide (LASIX) 20 MG tablet Take 20 mg by mouth 2 (two) times daily.    [provider]  ketoconazole (NIZORAL) 2 % cream Apply 1 application topically 2 (two) times daily as needed.     [provider]  lactulose, encephalopathy, (ENULOSE) 10 GM/15ML SOLN Take 20 g by mouth 3 (three) times daily.     [provider]  levETIRAcetam (KEPPRA XR) 500 MG 24 hr tablet Take 500 mg by mouth 2 (two) times daily.    [provider]  Melatonin-Pyridoxine (MELATIN PO) Take 1 tablet by mouth at bedtime as needed.    [provider]  MILK THISTLE PO Take 1 capsule by mouth at bedtime.     [provider]  propranolol (INDERAL) 20 MG tablet Take 20 mg by mouth daily.    [provider]  rifaximin (XIFAXAN) 550 MG TABS tablet Take 550 mg by mouth 2 (two) times daily.    [provider]  sertraline (ZOLOFT) 25 MG tablet Take 25 mg by mouth daily. 10/24/18   [provider]  spironolactone (ALDACTONE) 50 MG tablet Take 50 mg by mouth 2 (two) times daily.    [provider]  tadalafil (CIALIS) 20 MG tablet Take 20 mg by mouth daily as needed for erectile dysfunction.    [provider]  tizanidine (ZANAFLEX) 2 MG capsule Take 2 mg by mouth 3 (three) times daily as needed for muscle spasms.    [provider]  traMADol (ULTRAM) 50 MG tablet Take 50 mg by mouth 3 (three) times daily as needed. 10/31/18   [provider]  traZODone (DESYREL) 100 MG tablet Take 300 mg by  mouth at bedtime. PATIENT TAKES 3 TABLETS    [provider]    Allergies Poison ivy extract, Poison oak extract, and Poison ivy extract [poison ivy extract]  Family History  Problem Relation Age of Onset  . Hypertension Mother   . Hypertension Father     Social History Social History   Tobacco Use  . Smoking status: Current Every Day Smoker    Packs/day: 0.50    Years: 15.00    Pack years: 7.50    Types: Cigarettes  . Smokeless tobacco: Never Used  Substance Use Topics  . Alcohol use:  Yes    Comment: Twice a month.  He used to drink heavily in the past.  . Drug use: Yes    Types: Marijuana     Review of Systems  Constitutional: No fever/chills Eyes: No visual changes. No discharge ENT: No upper respiratory complaints. Cardiovascular: no chest pain. Respiratory: no cough. No SOB. Gastrointestinal: No abdominal pain.  No nausea, no vomiting.  No diarrhea.  No constipation. Musculoskeletal: Positive for left rib pain, right wrist pain Skin: Negative for rash, abrasions, lacerations, ecchymosis. Neurological: Negative for headaches, focal weakness or numbness. 10-point ROS otherwise negative.  ____________________________________________   PHYSICAL EXAM:  VITAL SIGNS: ED Triage Vitals  Enc Vitals Group     BP 04/09/19 1500 (!) 143/70     Pulse Rate 04/09/19 1500 (!) 54     Resp 04/09/19 1500 19     Temp 04/09/19 1500 98.2 F (36.8 C)     Temp Source 04/09/19 1500 Oral     SpO2 04/09/19 1500 97 %     Weight 04/09/19 1501 203 lb (92.1 kg)     Height 04/09/19 1501 5\' 11"  (1.803 m)     Head Circumference --      Peak Flow --      Pain Score 04/09/19 1509 10     Pain Loc --      Pain Edu? --      Excl. in [place]? --      Constitutional: Alert and oriented. Well appearing and in no acute distress. Eyes: Conjunctivae are normal. PERRL. EOMI. Head: Atraumatic. ENT:      Ears:       Nose: No congestion/rhinnorhea.      Mouth/Throat: Mucous membranes  are moist.  Neck: No stridor.  No cervical spine tenderness to palpation.  Cardiovascular: Normal rate, regular rhythm. Normal S1 and S2.  Good peripheral circulation. Respiratory: Normal respiratory effort without tachypnea or retractions. Lungs CTAB. Good air entry to the bases with no decreased or absent breath sounds. Gastrointestinal: Bowel sounds 4 quadrants. Soft and nontender to palpation. No guarding or rigidity. No palpable masses. No distention. No CVA tenderness. Musculoskeletal: Full range of motion to all extremities. No gross deformities appreciated.  Visualization of the left ribs reveals no erythema, edema, ecchymosis, abrasions or lacerations.  No identified visual abnormality about the chest wall.  Equal chest rise and fall.  No paradoxical chest wall movement.  Patient is tender to palpation diffusely and ribs 4 through 8 along the anterolateral aspect of the rib cage.  No palpable abnormality.  Underlying breath sounds bilaterally.  Examination of the right wrist reveals no gross deformity.  Mild edema about the wrist.  No ecchymosis, abrasions, lacerations.  Patient is tender to palpation over the distal radius and ulna with no palpable abnormality.  Limited range of motion due to pain.  Sensation intact all 5 digits.  Capillary refill less than 2 seconds all digits. Neurologic:  Normal speech and language. No gross focal neurologic deficits are appreciated.  Skin:  Skin is warm, dry and intact. No rash noted. Psychiatric: Mood and affect are normal. Speech and behavior are normal. Patient exhibits appropriate insight and judgement.   ____________________________________________   LABS (all labs ordered are listed, but only abnormal results are displayed)  Labs Reviewed - No data to display ____________________________________________  EKG   ____________________________________________  RADIOLOGY I personally viewed and evaluated these images as part of my medical  decision making, as well as reviewing the written report by the  radiologist.  DG Ribs Unilateral W/Chest Left  Result Date: 04/09/2019 CLINICAL DATA:  Pain status post fall EXAM: LEFT RIBS AND CHEST - 3+ VIEW COMPARISON:  December 29, 2018 FINDINGS: The heart size is borderline enlarged. There is no pneumothorax. No large pleural effusion. No focal infiltrate. Aortic calcifications are noted. There are multiple old healed left-sided rib fractures. There is no evidence for an acute displaced left-sided rib fracture on today's study. A TIPS stent projects over the patient's liver shadow. IMPRESSION: 1. No acute displaced rib fracture.  No pneumothorax. 2. Again noted are multiple old healed left-sided rib fractures. Electronically Signed   By: Constance Holster M.D.   On: 04/09/2019 16:38   DG Wrist Complete Right  Result Date: 04/09/2019 CLINICAL DATA:  Fall.  Right wrist injury. EXAM: RIGHT WRIST - COMPLETE 3+ VIEW COMPARISON:  None. FINDINGS: There is no evidence of fracture or dislocation. There is no evidence of arthropathy or other focal bone abnormality. Soft tissues are unremarkable. IMPRESSION: Negative right wrist radiographs. Electronically Signed   By: San Morelle M.D.   On: 04/09/2019 16:29    ____________________________________________    PROCEDURES  Procedure(s) performed:    Procedures    Medications  HYDROcodone-acetaminophen (NORCO/VICODIN) 5-325 MG per tablet 1 tablet (has no administration in time range)  meloxicam (MOBIC) tablet 15 mg (has no administration in time range)     ____________________________________________   INITIAL IMPRESSION / ASSESSMENT AND PLAN / ED COURSE  Pertinent labs & imaging results that were available during my care of the patient were reviewed by me and considered in my medical decision making (see chart for details).  Review of the [place] CSRS was performed in accordance of the [place] prior to dispensing any controlled  drugs.           Patient's diagnosis is consistent with sprain, rib contusion.  Patient presented to emergency department complaining of 2 separate injuries with 2 separate musculoskeletal complaints.  Overall exam was reassuring to both injuries.  Imaging reveals no acute fractures to the left ribs, right wrist.  At this time patient will be provided a Velcro wrist splint, meloxicam at home for symptom improvement..  No further work-up deemed necessary at this time.  Follow-up primary care as needed.  Patient is given ED precautions to return to the ED for any worsening or new symptoms.     ____________________________________________  FINAL CLINICAL IMPRESSION(S) / ED DIAGNOSES  Final diagnoses:  Sprain of right wrist, initial encounter  Contusion of rib on left side, initial encounter      NEW MEDICATIONS STARTED DURING THIS VISIT:  ED Discharge Orders    None          This chart was dictated using voice recognition software/Dragon. Despite best efforts to proofread, errors can occur which can change the meaning. Any change was purely unintentional.    Darletta Moll, PA-C 04/09/19 1708    Blake Divine, MD 04/10/19 0001

## 2019-04-09 NOTE — ED Triage Notes (Signed)
Pt fell in attic. C/o left wrist pain.  [place] getting up from lowering something down to wife.

## 2019-06-27 ENCOUNTER — Other Ambulatory Visit: Payer: Self-pay

## 2019-06-27 ENCOUNTER — Ambulatory Visit: Payer: Self-pay

## 2019-06-27 ENCOUNTER — Emergency Department: Payer: Medicare HMO

## 2019-06-27 ENCOUNTER — Emergency Department
Admission: EM | Admit: 2019-06-27 | Discharge: 2019-06-27 | Disposition: A | Discharge status: Home / Self Care | Payer: Medicare HMO | Attending: Emergency Medicine | Admitting: Emergency Medicine

## 2019-06-27 DIAGNOSIS — F10929 Alcohol use, unspecified with intoxication, unspecified: Secondary | ICD-10-CM | POA: Insufficient documentation

## 2019-06-27 DIAGNOSIS — Z5321 Procedure and treatment not carried out due to patient leaving prior to being seen by health care provider: Secondary | ICD-10-CM | POA: Diagnosis not present

## 2019-06-27 LAB — COMPREHENSIVE METABOLIC PANEL
ALT: 28 U/L (ref 0–44)
AST: 43 U/L — ABNORMAL HIGH (ref 15–41)
Albumin: 3.4 g/dL — ABNORMAL LOW (ref 3.5–5.0)
Alkaline Phosphatase: 108 U/L (ref 38–126)
Anion gap: 8 (ref 5–15)
BUN: 6 mg/dL (ref 6–20)
CO2: 23 mmol/L (ref 22–32)
Calcium: 8.4 mg/dL — ABNORMAL LOW (ref 8.9–10.3)
Chloride: 107 mmol/L (ref 98–111)
Creatinine, Ser: 0.71 mg/dL (ref 0.61–1.24)
GFR calc Af Amer: >60 mL/min (ref >60)
GFR calc non Af Amer: >60 mL/min (ref >60)
Glucose, Bld: 78 mg/dL (ref 70–99)
Potassium: 3.7 mmol/L (ref 3.5–5.1)
Sodium: 138 mmol/L (ref 135–145)
Total Bilirubin: 2.3 mg/dL — ABNORMAL HIGH (ref 0.3–1.2)
Total Protein: 6.3 g/dL — ABNORMAL LOW (ref 6.5–8.1)

## 2019-06-27 LAB — URINE DRUG SCREEN, QUALITATIVE (ARMC ONLY)
Amphetamines, Ur Screen: NOT DETECTED
Barbiturates, Ur Screen: NOT DETECTED
Benzodiazepine, Ur Scrn: NOT DETECTED
Cannabinoid 50 Ng, Ur ~~LOC~~: POSITIVE — AB
Cocaine Metabolite,Ur ~~LOC~~: NOT DETECTED
MDMA (Ecstasy)Ur Screen: NOT DETECTED
Methadone Scn, Ur: NOT DETECTED
Opiate, Ur Screen: NOT DETECTED
Phencyclidine (PCP) Ur S: NOT DETECTED
Tricyclic, Ur Screen: NOT DETECTED

## 2019-06-27 LAB — CBC WITH DIFFERENTIAL/PLATELET
Abs Immature Granulocytes: 0.02 10*3/uL (ref 0.00–0.07)
Basophils Absolute: 0.1 10*3/uL (ref 0.0–0.1)
Basophils Relative: 1 %
Eosinophils Absolute: 0.2 10*3/uL (ref 0.0–0.5)
Eosinophils Relative: 3 %
HCT: 34.1 % — ABNORMAL LOW (ref 39.0–52.0)
Hemoglobin: 12.4 g/dL — ABNORMAL LOW (ref 13.0–17.0)
Immature Granulocytes: 0 %
Lymphocytes Relative: 30 %
Lymphs Abs: 1.8 10*3/uL (ref 0.7–4.0)
MCH: 36.5 pg — ABNORMAL HIGH (ref 26.0–34.0)
MCHC: 36.4 g/dL — ABNORMAL HIGH (ref 30.0–36.0)
MCV: 100.3 fL — ABNORMAL HIGH (ref 80.0–100.0)
Monocytes Absolute: 0.9 10*3/uL (ref 0.1–1.0)
Monocytes Relative: 14 %
Neutro Abs: 3.1 10*3/uL (ref 1.7–7.7)
Neutrophils Relative %: 52 %
Platelets: 131 10*3/uL — ABNORMAL LOW (ref 150–400)
RBC: 3.4 MIL/uL — ABNORMAL LOW (ref 4.22–5.81)
RDW: 13.8 % (ref 11.5–15.5)
WBC: 6.1 10*3/uL (ref 4.0–10.5)
nRBC: 0 % (ref 0.0–0.2)

## 2019-06-27 LAB — URINALYSIS, COMPLETE (UACMP) WITH MICROSCOPIC
Bacteria, UA: NONE SEEN
Bilirubin Urine: NEGATIVE
Glucose, UA: NEGATIVE mg/dL
Hgb urine dipstick: NEGATIVE
Ketones, ur: NEGATIVE mg/dL
Leukocytes,Ua: NEGATIVE
Nitrite: NEGATIVE
Protein, ur: NEGATIVE mg/dL
Specific Gravity, Urine: 1.011 (ref 1.005–1.030)
pH: 5 (ref 5.0–8.0)

## 2019-06-27 LAB — ETHANOL: Alcohol, Ethyl (B): 159 mg/dL — ABNORMAL HIGH (ref <10)

## 2019-06-27 NOTE — ED Notes (Signed)
Pt immediately left triage in w/c, got up with steady gait noted and left ED lobby; security reports pt walking away across parking lot
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
## 2019-06-27 NOTE — ED Notes (Signed)
Pt has not returned

## 2019-06-27 NOTE — ED Notes (Addendum)
Dr Archie Balboa to triage to assess pt; pt agrees to have labwork drawn, denies SI or HI; no need for IVC at this time per Dr Archie Balboa

## 2019-06-27 NOTE — ED Provider Notes (Signed)
Talk to the people might want to leave.  On my exam patient does state that he has drunk alcohol recently.  However he denies any thoughts of self-harm or thoughts of wanting to harm anyone else.  I did discuss my concern given that the patient mentioned he had fallen about trying to discover the cause of the fall and to evaluate for any traumatic injuries.  I did discuss my desire to obtain blood work as well as CT images of his head and neck.  Patient verbalized understanding of my concerns.  Initially he stated he would stay for these test.  At this time I do not feel patient necessitates involuntary commitment.  I think he does still have capacity to make his own medical decisions.  Apparently however shortly after I talked to the patient he chose to leave the emergency department   Nance Pear, MD 06/27/19 2153

## 2019-06-27 NOTE — ED Triage Notes (Signed)
Pt in via EMS from home with c/o needing to be checked out for his family.  Pt has liver cancer and was advised by his MD not to drink but he has been drinking all day. Pt family called EMS and reported that pt was combative and needed to be seen. EMS reports pt has been pleasant and cooperative with them  Pt also fell this am but denies injuries.

## 2019-06-27 NOTE — ED Triage Notes (Signed)
Pt staggering in triage area stating "I dont want to be locked up like a prisoner". When this RN asked pt why he was here today he said the ambulance brought him and he does not know why. Pt states, "I dont care what you want, I am managing on my own". Pt denies drinking alcohol today but states he drank "quite a bit" last night.

## 2019-06-27 NOTE — ED Notes (Signed)
hosp security notifies this nurse that pt was found wandering around medical mall and incoherent; escorted pt back to ED lobby; will bring pt to triage to reassess; pt apparently refused protocols initially and displayed inapprop behavior
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
## 2019-07-11 ENCOUNTER — Other Ambulatory Visit: Payer: Self-pay

## 2019-07-11 ENCOUNTER — Observation Stay
Admission: EM | Admit: 2019-07-11 | Discharge: 2019-07-14 | Disposition: A | Discharge status: Home / Self Care | Payer: Medicare HMO | Attending: Internal Medicine | Admitting: Internal Medicine

## 2019-07-11 ENCOUNTER — Emergency Department: Payer: Medicare HMO

## 2019-07-11 ENCOUNTER — Encounter: Payer: Self-pay | Admitting: Emergency Medicine

## 2019-07-11 ENCOUNTER — Observation Stay: Payer: Medicare HMO

## 2019-07-11 DIAGNOSIS — E669 Obesity, unspecified: Secondary | ICD-10-CM | POA: Diagnosis not present

## 2019-07-11 DIAGNOSIS — I4510 Unspecified right bundle-branch block: Secondary | ICD-10-CM | POA: Diagnosis not present

## 2019-07-11 DIAGNOSIS — K7682 Hepatic encephalopathy: Secondary | ICD-10-CM | POA: Diagnosis present

## 2019-07-11 DIAGNOSIS — F419 Anxiety disorder, unspecified: Secondary | ICD-10-CM | POA: Diagnosis not present

## 2019-07-11 DIAGNOSIS — R569 Unspecified convulsions: Secondary | ICD-10-CM

## 2019-07-11 DIAGNOSIS — Z791 Long term (current) use of non-steroidal anti-inflammatories (NSAID): Secondary | ICD-10-CM | POA: Insufficient documentation

## 2019-07-11 DIAGNOSIS — F1010 Alcohol abuse, uncomplicated: Secondary | ICD-10-CM | POA: Insufficient documentation

## 2019-07-11 DIAGNOSIS — E785 Hyperlipidemia, unspecified: Secondary | ICD-10-CM | POA: Diagnosis not present

## 2019-07-11 DIAGNOSIS — M549 Dorsalgia, unspecified: Secondary | ICD-10-CM | POA: Insufficient documentation

## 2019-07-11 DIAGNOSIS — Z20822 Contact with and (suspected) exposure to covid-19: Secondary | ICD-10-CM | POA: Insufficient documentation

## 2019-07-11 DIAGNOSIS — Z8614 Personal history of Methicillin resistant Staphylococcus aureus infection: Secondary | ICD-10-CM | POA: Diagnosis not present

## 2019-07-11 DIAGNOSIS — F17210 Nicotine dependence, cigarettes, uncomplicated: Secondary | ICD-10-CM | POA: Insufficient documentation

## 2019-07-11 DIAGNOSIS — Z79899 Other long term (current) drug therapy: Secondary | ICD-10-CM | POA: Diagnosis not present

## 2019-07-11 DIAGNOSIS — R0781 Pleurodynia: Secondary | ICD-10-CM

## 2019-07-11 DIAGNOSIS — G4730 Sleep apnea, unspecified: Secondary | ICD-10-CM | POA: Diagnosis not present

## 2019-07-11 DIAGNOSIS — K219 Gastro-esophageal reflux disease without esophagitis: Secondary | ICD-10-CM | POA: Diagnosis not present

## 2019-07-11 DIAGNOSIS — R410 Disorientation, unspecified: Secondary | ICD-10-CM | POA: Diagnosis not present

## 2019-07-11 DIAGNOSIS — J449 Chronic obstructive pulmonary disease, unspecified: Secondary | ICD-10-CM | POA: Insufficient documentation

## 2019-07-11 DIAGNOSIS — R0789 Other chest pain: Secondary | ICD-10-CM | POA: Diagnosis present

## 2019-07-11 DIAGNOSIS — I10 Essential (primary) hypertension: Secondary | ICD-10-CM | POA: Diagnosis not present

## 2019-07-11 DIAGNOSIS — G40909 Epilepsy, unspecified, not intractable, without status epilepticus: Secondary | ICD-10-CM | POA: Insufficient documentation

## 2019-07-11 DIAGNOSIS — Z7951 Long term (current) use of inhaled steroids: Secondary | ICD-10-CM | POA: Diagnosis not present

## 2019-07-11 DIAGNOSIS — F32A Depression, unspecified: Secondary | ICD-10-CM | POA: Diagnosis present

## 2019-07-11 DIAGNOSIS — K7290 Hepatic failure, unspecified without coma: Secondary | ICD-10-CM | POA: Diagnosis present

## 2019-07-11 DIAGNOSIS — K766 Portal hypertension: Secondary | ICD-10-CM | POA: Insufficient documentation

## 2019-07-11 DIAGNOSIS — G8929 Other chronic pain: Secondary | ICD-10-CM | POA: Insufficient documentation

## 2019-07-11 DIAGNOSIS — F329 Major depressive disorder, single episode, unspecified: Secondary | ICD-10-CM | POA: Diagnosis not present

## 2019-07-11 DIAGNOSIS — N529 Male erectile dysfunction, unspecified: Secondary | ICD-10-CM | POA: Diagnosis not present

## 2019-07-11 DIAGNOSIS — Z720 Tobacco use: Secondary | ICD-10-CM

## 2019-07-11 DIAGNOSIS — K7030 Alcoholic cirrhosis of liver without ascites: Secondary | ICD-10-CM | POA: Insufficient documentation

## 2019-07-11 DIAGNOSIS — Y92009 Unspecified place in unspecified non-institutional (private) residence as the place of occurrence of the external cause: Secondary | ICD-10-CM

## 2019-07-11 LAB — URINE DRUG SCREEN, QUALITATIVE (ARMC ONLY)
Amphetamines, Ur Screen: NOT DETECTED
Barbiturates, Ur Screen: NOT DETECTED
Benzodiazepine, Ur Scrn: NOT DETECTED
Cannabinoid 50 Ng, Ur ~~LOC~~: POSITIVE — AB
Cocaine Metabolite,Ur ~~LOC~~: NOT DETECTED
MDMA (Ecstasy)Ur Screen: NOT DETECTED
Methadone Scn, Ur: NOT DETECTED
Opiate, Ur Screen: NOT DETECTED
Phencyclidine (PCP) Ur S: NOT DETECTED
Tricyclic, Ur Screen: NOT DETECTED

## 2019-07-11 LAB — COMPREHENSIVE METABOLIC PANEL
ALT: 35 U/L (ref 0–44)
AST: 53 U/L — ABNORMAL HIGH (ref 15–41)
Albumin: 3.2 g/dL — ABNORMAL LOW (ref 3.5–5.0)
Alkaline Phosphatase: 107 U/L (ref 38–126)
Anion gap: 10 (ref 5–15)
BUN: 7 mg/dL (ref 6–20)
CO2: 22 mmol/L (ref 22–32)
Calcium: 8.7 mg/dL — ABNORMAL LOW (ref 8.9–10.3)
Chloride: 103 mmol/L (ref 98–111)
Creatinine, Ser: 0.72 mg/dL (ref 0.61–1.24)
GFR calc Af Amer: >60 mL/min (ref >60)
GFR calc non Af Amer: >60 mL/min (ref >60)
Glucose, Bld: 128 mg/dL — ABNORMAL HIGH (ref 70–99)
Potassium: 3.9 mmol/L (ref 3.5–5.1)
Sodium: 135 mmol/L (ref 135–145)
Total Bilirubin: 2.2 mg/dL — ABNORMAL HIGH (ref 0.3–1.2)
Total Protein: 6.4 g/dL — ABNORMAL LOW (ref 6.5–8.1)

## 2019-07-11 LAB — CBC
HCT: 34.5 % — ABNORMAL LOW (ref 39.0–52.0)
Hemoglobin: 12.4 g/dL — ABNORMAL LOW (ref 13.0–17.0)
MCH: 36 pg — ABNORMAL HIGH (ref 26.0–34.0)
MCHC: 35.9 g/dL (ref 30.0–36.0)
MCV: 100.3 fL — ABNORMAL HIGH (ref 80.0–100.0)
Platelets: 141 10*3/uL — ABNORMAL LOW (ref 150–400)
RBC: 3.44 MIL/uL — ABNORMAL LOW (ref 4.22–5.81)
RDW: 13.9 % (ref 11.5–15.5)
WBC: 6.9 10*3/uL (ref 4.0–10.5)
nRBC: 0 % (ref 0.0–0.2)

## 2019-07-11 LAB — TROPONIN I (HIGH SENSITIVITY): Troponin I (High Sensitivity): 15 ng/L (ref <18)

## 2019-07-11 LAB — ETHANOL: Alcohol, Ethyl (B): 69 mg/dL — ABNORMAL HIGH (ref <10)

## 2019-07-11 LAB — AMMONIA: Ammonia: 37 umol/L — ABNORMAL HIGH (ref 9–35)

## 2019-07-11 LAB — PHOSPHORUS: Phosphorus: 3 mg/dL (ref 2.5–4.6)

## 2019-07-11 LAB — SARS CORONAVIRUS 2 (TAT 6-24 HRS): SARS Coronavirus 2: NEGATIVE

## 2019-07-11 LAB — MAGNESIUM: Magnesium: 1.8 mg/dL (ref 1.7–2.4)

## 2019-07-11 MED ORDER — TRAMADOL HCL 50 MG PO TABS
50.0000 mg | ORAL_TABLET | Freq: Three times a day (TID) | ORAL | Status: DC | PRN
  Administered 2019-07-12 – 2019-07-14 (×4): 50 mg via ORAL
  Filled 2019-07-11 (×5): qty 1

## 2019-07-11 MED ORDER — LACTULOSE 10 GM/15ML PO SOLN
30.0000 g | Freq: Two times a day (BID) | ORAL | Status: DC
  Administered 2019-07-11 – 2019-07-14 (×7): 30 g via ORAL
  Filled 2019-07-11 (×7): qty 60

## 2019-07-11 MED ORDER — LORAZEPAM 2 MG/ML IJ SOLN
0.0000 mg | Freq: Four times a day (QID) | INTRAMUSCULAR | Status: AC
  Administered 2019-07-11: 2 mg via INTRAVENOUS
  Filled 2019-07-11: qty 1

## 2019-07-11 MED ORDER — LORAZEPAM 2 MG/ML IJ SOLN: 0.0000 mg | Freq: Two times a day (BID) | INTRAMUSCULAR | Status: DC

## 2019-07-11 MED ORDER — ZONISAMIDE 100 MG PO CAPS
400.0000 mg | ORAL_CAPSULE | Freq: Every day | ORAL | Status: DC
  Administered 2019-07-12 – 2019-07-13 (×3): 400 mg via ORAL
  Filled 2019-07-11 (×4): qty 4

## 2019-07-11 MED ORDER — LORAZEPAM 2 MG/ML IJ SOLN: 1.0000 mg | INTRAMUSCULAR | Status: DC | PRN

## 2019-07-11 MED ORDER — SERTRALINE HCL 50 MG PO TABS
50.0000 mg | ORAL_TABLET | Freq: Every day | ORAL | Status: DC
  Administered 2019-07-12 – 2019-07-14 (×3): 50 mg via ORAL
  Filled 2019-07-11 (×3): qty 1

## 2019-07-11 MED ORDER — ONDANSETRON HCL 4 MG/2ML IJ SOLN: 4.0000 mg | Freq: Three times a day (TID) | INTRAMUSCULAR | Status: DC | PRN

## 2019-07-11 MED ORDER — ADULT MULTIVITAMIN W/MINERALS CH
1.0000 | ORAL_TABLET | Freq: Every day | ORAL | Status: DC
  Administered 2019-07-11 – 2019-07-14 (×4): 1 via ORAL
  Filled 2019-07-11 (×4): qty 1

## 2019-07-11 MED ORDER — THIAMINE HCL 100 MG PO TABS
100.0000 mg | ORAL_TABLET | Freq: Every day | ORAL | Status: DC
  Administered 2019-07-12 – 2019-07-14 (×3): 100 mg via ORAL
  Filled 2019-07-11 (×3): qty 1

## 2019-07-11 MED ORDER — NICOTINE 21 MG/24HR TD PT24
21.0000 mg | MEDICATED_PATCH | Freq: Every day | TRANSDERMAL | Status: DC
  Administered 2019-07-12 – 2019-07-14 (×3): 21 mg via TRANSDERMAL
  Filled 2019-07-11 (×3): qty 1

## 2019-07-11 MED ORDER — THIAMINE HCL 100 MG/ML IJ SOLN
500.0000 mg | Freq: Once | INTRAMUSCULAR | Status: AC
  Administered 2019-07-11: 500 mg via INTRAVENOUS
  Filled 2019-07-11: qty 6

## 2019-07-11 MED ORDER — RIFAXIMIN 550 MG PO TABS
550.0000 mg | ORAL_TABLET | Freq: Two times a day (BID) | ORAL | Status: DC
  Administered 2019-07-12 – 2019-07-14 (×6): 550 mg via ORAL
  Filled 2019-07-11 (×6): qty 1

## 2019-07-11 MED ORDER — ENOXAPARIN SODIUM 40 MG/0.4ML ~~LOC~~ SOLN
40.0000 mg | SUBCUTANEOUS | Status: DC
  Administered 2019-07-12 – 2019-07-13 (×3): 40 mg via SUBCUTANEOUS
  Filled 2019-07-11 (×3): qty 0.4

## 2019-07-11 MED ORDER — FLUTICASONE PROPIONATE 50 MCG/ACT NA SUSP
1.0000 | Freq: Every day | NASAL | Status: DC | PRN
  Filled 2019-07-11: qty 16

## 2019-07-11 MED ORDER — LACTATED RINGERS IV BOLUS
1000.0000 mL | Freq: Once | INTRAVENOUS | Status: AC
  Administered 2019-07-11: 1000 mL via INTRAVENOUS

## 2019-07-11 MED ORDER — VITAMIN E 180 MG (400 UNIT) PO CAPS
400.0000 [IU] | ORAL_CAPSULE | Freq: Every evening | ORAL | Status: DC
  Administered 2019-07-12 – 2019-07-13 (×3): 400 [IU] via ORAL
  Filled 2019-07-11 (×4): qty 1

## 2019-07-11 MED ORDER — FOLIC ACID 1 MG PO TABS
1.0000 mg | ORAL_TABLET | Freq: Every day | ORAL | Status: DC
  Administered 2019-07-11 – 2019-07-12 (×2): 1 mg via ORAL
  Filled 2019-07-11 (×2): qty 1

## 2019-07-11 MED ORDER — ALBUTEROL SULFATE (2.5 MG/3ML) 0.083% IN NEBU: 2.5000 mg | INHALATION_SOLUTION | RESPIRATORY_TRACT | Status: DC | PRN

## 2019-07-11 MED ORDER — LORAZEPAM 1 MG PO TABS: 1.0000 mg | ORAL_TABLET | ORAL | Status: DC | PRN

## 2019-07-11 MED ORDER — MELATONIN 5 MG PO TABS
10.0000 mg | ORAL_TABLET | Freq: Every day | ORAL | Status: DC
  Administered 2019-07-12 – 2019-07-13 (×3): 10 mg via ORAL
  Filled 2019-07-11 (×4): qty 2

## 2019-07-11 MED ORDER — PNEUMOCOCCAL VAC POLYVALENT 25 MCG/0.5ML IJ INJ: 0.5000 mL | INJECTION | INTRAMUSCULAR | Status: DC

## 2019-07-11 MED ORDER — TRAZODONE HCL 50 MG PO TABS
300.0000 mg | ORAL_TABLET | Freq: Every day | ORAL | Status: DC
  Administered 2019-07-12 – 2019-07-13 (×3): 300 mg via ORAL
  Filled 2019-07-11 (×3): qty 6

## 2019-07-11 MED ORDER — PROPRANOLOL HCL 20 MG PO TABS
20.0000 mg | ORAL_TABLET | Freq: Every day | ORAL | Status: DC
  Administered 2019-07-12 – 2019-07-14 (×3): 20 mg via ORAL
  Filled 2019-07-11 (×3): qty 1

## 2019-07-11 MED ORDER — HYDRALAZINE HCL 20 MG/ML IJ SOLN: 5.0000 mg | INTRAMUSCULAR | Status: DC | PRN

## 2019-07-11 MED ORDER — LEVETIRACETAM IN NACL 500 MG/100ML IV SOLN
500.0000 mg | Freq: Two times a day (BID) | INTRAVENOUS | Status: DC
  Administered 2019-07-11 – 2019-07-14 (×6): 500 mg via INTRAVENOUS
  Filled 2019-07-11 (×10): qty 100

## 2019-07-11 MED ORDER — THIAMINE HCL 100 MG/ML IJ SOLN
100.0000 mg | Freq: Every day | INTRAMUSCULAR | Status: DC
  Filled 2019-07-11: qty 2

## 2019-07-11 NOTE — ED Notes (Signed)
Pt to CT with security EDT and radiology staff
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
## 2019-07-11 NOTE — ED Notes (Signed)
Pt was dressed out while in triage. Items removed and bagged include one pack of cigs, two lighters, one flashlight, one wallet, two headbands, one ink pen, one wrist watch, one pair of shoes, one pair of socks, one pair of pants, one shirt. Pt taken to room 20.

## 2019-07-11 NOTE — ED Provider Notes (Signed)
[facility] Emergency Department Provider Note   ____________________________________________   First MD Initiated Contact with Patient 07/11/19 1119     (approximate)  I have reviewed the triage vital signs and the nursing notes.   HISTORY  Chief Complaint Psychiatric Evaluation    HPI Adrian Hale is a 59 y.o. male with possible history of alcohol abuse, cirrhosis status post TIPS, seizures, hypertension, COPD, and aortic aneurysm who presents to the ED for psychiatric evaluation.  Patient states that "I need help" and that he needs somewhere to go away from his wife.  Patient states he tries to walk away but he cannot walk very far and has a history of encephalopathy.  He admits to ongoing alcohol abuse, drinking an unknown amount of liquor on a daily basis and with his last drink coming last night.  He arrives to the ED under IVC after he reportedly got aggressive with his wife.  She had stated that he had given up alcohol for multiple years until the past month, when he has been drinking on a daily basis.  Patient currently denies any suicidal or homicidal ideation, but emphasizes that he "needs help".  He currently complains of back pain that is chronic.        Past Medical History:  Diagnosis Date  . ALC (alcoholic liver cirrhosis) ([place])   . Alcohol abuse    NONE SINCE 08/28/2011 AND ATTENDING AA MEETINGS  . Alcoholic cirrhosis of liver ([place])   . Alcoholic peripheral neuropathy ([place])   . Allergy to poison ivy 06/06/2013   RASH  . Anxiety   . Aortic aneurysm ([place])   . Cellulitis of both lower extremities   . Complex partial seizure evolving to generalized seizure ([place])   . COPD (chronic obstructive pulmonary disease) ([place])   . Depression    MAJOR DEPRESSIVE DISORDER  . ED (erectile dysfunction)   . Hepatic encephalopathy ([place])   . Hyperlipidemia   . Hypertension    ESSENTIAL  . Maculopathy   . Marijuana abuse   . MRSA (methicillin  resistant staph aureus) culture positive   . Obesity   . Portal hypertension ([place])   . Pseudophakia of both eyes   . Ptosis of both eyelids   . S/P TIPS (transjugular intrahepatic portosystemic shunt)   . Seizures ([place])    Last seizure April 06, 2013, [facility]  . Seizures [facility])   . Sleep apnea    Does not use CPAP  . Thrombocytopenia, secondary   . Tobacco abuse   . Toxic maculopathy of right eye   . Ulcer of lower limb ([place])    ABSCESS OR CELLULITIS OF LEG    Patient Active Problem List   Diagnosis Date Noted  . ALC (alcoholic liver cirrhosis) ([place])   . Seizures ([place])   . Tobacco abuse   . Hepatic encephalopathy ([place]) 12/02/2018    Past Surgical History:  Procedure Laterality Date  . BREAST SURGERY     BIOPSY  . CATARACT EXTRACTION Right 10/19/2011  . CATARACT EXTRACTION Left 02/22/2012  . COLONOSCOPY    . ESOPHAGOGASTRODUODENOSCOPY    . EYE SURGERY    . [place] Left 10/30/2014   Procedure: HALLUX VALGUS AUSTIN;  Surgeon: Samara Deist, DPM;  Location: [facility] ORS;  Service: Podiatry;  Laterality: Left;  . LIVER SURGERY     TIPS PROCEDURE  . None    . TIPS PROCEDURE      Prior to Admission medications  Medication Sig Start Date End Date Taking? Authorizing Provider  acetaminophen (TYLENOL) 325 MG tablet Take 650 mg by mouth every 4 (four) hours as needed.    [provider]  albuterol (PROVENTIL HFA;VENTOLIN HFA) 108 (90 BASE) MCG/ACT inhaler Inhale 2 puffs into the lungs every 4 (four) hours as needed for wheezing or shortness of breath.    [provider]  albuterol (PROVENTIL) (5 MG/ML) 0.5% nebulizer solution Take 2.5 mg by nebulization every 4 (four) hours as needed for wheezing or shortness of breath.    [provider]  esomeprazole (NEXIUM) 40 MG capsule Take 40 mg by mouth daily.     [provider]  fluorouracil (EFUDEX) 5 % cream Apply 1 application topically 2 (two) times daily. 08/15/18   [provider]  fluticasone (FLONASE) 50 MCG/ACT nasal spray Place 1 spray into both nostrils daily.    [provider]  fluticasone (FLOVENT HFA) 110 MCG/ACT inhaler Inhale 1 puff into the lungs 2 (two) times daily.    [provider]  furosemide (LASIX) 20 MG tablet Take 20 mg by mouth 2 (two) times daily.    [provider]  HYDROcodone-acetaminophen (NORCO/VICODIN) 5-325 MG tablet Take 1 tablet by mouth every 4 (four) hours as needed for moderate pain. 04/09/19   Cuthriell, Charline Bills, PA-C  ketoconazole (NIZORAL) 2 % cream Apply 1 application topically 2 (two) times daily as needed.     [provider]  lactulose, encephalopathy, (ENULOSE) 10 GM/15ML SOLN Take 20 g by mouth 3 (three) times daily.     [provider]  levETIRAcetam (KEPPRA XR) 500 MG 24 hr tablet Take 500 mg by mouth 2 (two) times daily.    [provider]  Melatonin-Pyridoxine (MELATIN PO) Take 1 tablet by mouth at bedtime as needed.    [provider]  meloxicam (MOBIC) 15 MG tablet Take 1 tablet (15 mg total) by mouth daily. 04/09/19   Cuthriell, Charline Bills, PA-C  MILK THISTLE PO Take 1 capsule by mouth at bedtime.     [provider]  propranolol (INDERAL) 20 MG tablet Take 20 mg by mouth daily.    [provider]  rifaximin (XIFAXAN) 550 MG TABS tablet Take 550 mg by mouth 2 (two) times daily.    [provider]  sertraline (ZOLOFT) 25 MG tablet Take 25 mg by mouth daily. 10/24/18   [provider]  spironolactone (ALDACTONE) 50 MG tablet Take 50 mg by mouth 2 (two) times daily.    [provider]  tadalafil (CIALIS) 20 MG tablet Take 20 mg by mouth daily as needed for erectile dysfunction.    [provider]  tizanidine (ZANAFLEX) 2 MG capsule Take 2 mg by mouth 3 (three) times daily as needed for muscle spasms.    [provider]  traMADol (ULTRAM) 50 MG tablet Take 50 mg by mouth 3 (three) times  daily as needed. 10/31/18   [provider]  traZODone (DESYREL) 100 MG tablet Take 300 mg by mouth at bedtime. PATIENT TAKES 3 TABLETS    [provider]    Allergies Poison ivy extract, Poison oak extract, and Poison ivy extract [poison ivy extract]  Family History  Problem Relation Age of Onset  . Hypertension Mother   . Hypertension Father     Social History Social History   Tobacco Use  . Smoking status: Current Every Day Smoker    Packs/day: 0.50    Years: 15.00  Pack years: 7.50    Types: Cigarettes  . Smokeless tobacco: Never Used  Substance Use Topics  . Alcohol use: Yes    Comment: Twice a month.  He used to drink heavily in the past.  . Drug use: Yes    Types: Marijuana    Review of Systems  Constitutional: No fever/chills Eyes: No visual changes. ENT: No sore throat. Cardiovascular: Denies chest pain. Respiratory: Denies shortness of breath. Gastrointestinal: No abdominal pain.  No nausea, no vomiting.  No diarrhea.  No constipation. Genitourinary: Negative for dysuria. Musculoskeletal: Positive for back pain. Skin: Negative for rash. Neurological: Negative for headaches, focal weakness or numbness.  ____________________________________________   PHYSICAL EXAM:  VITAL SIGNS: ED Triage Vitals  Enc Vitals Group     BP 07/11/19 1056 (!) 155/89     Pulse Rate 07/11/19 1056 (!) 59     Resp 07/11/19 1056 16     Temp 07/11/19 1056 99 F (37.2 C)     Temp Source 07/11/19 1056 Oral     SpO2 07/11/19 1056 97 %     Weight 07/11/19 1057 182 lb (82.6 kg)     Height 07/11/19 1057 5\' 11"  (1.803 m)     Head Circumference --      Peak Flow --      Pain Score 07/11/19 1056 8     Pain Loc --      Pain Edu? --      Excl. in [place]? --     Constitutional: Alert and oriented to person, place, and time but not situation.  Intoxicated appearing. Eyes: Conjunctivae are normal.  Extraocular movements intact without nystagmus.   Head:  Atraumatic. Nose: No congestion/rhinnorhea. Mouth/Throat: Mucous membranes are moist. Neck: Normal ROM Cardiovascular: Normal rate, regular rhythm. Grossly normal heart sounds. Respiratory: Normal respiratory effort.  No retractions. Lungs CTAB. Gastrointestinal: Soft and nontender. No distention. Genitourinary: deferred Musculoskeletal: No lower extremity tenderness nor edema. Neurologic:  Normal speech and language. No gross focal neurologic deficits are appreciated.  Ambulates with steady gait. Skin:  Skin is warm, dry and intact. No rash noted. Psychiatric: Mood and affect are normal. Speech and behavior are normal.  ____________________________________________   LABS (all labs ordered are listed, but only abnormal results are displayed)  Labs Reviewed  COMPREHENSIVE METABOLIC PANEL - Abnormal; Notable for the following components:      Result Value   Glucose, Bld 128 (*)    Calcium 8.7 (*)    Total Protein 6.4 (*)    Albumin 3.2 (*)    AST 53 (*)    Total Bilirubin 2.2 (*)    All other components within normal limits  ETHANOL - Abnormal; Notable for the following components:   Alcohol, Ethyl (B) 69 (*)    All other components within normal limits  CBC - Abnormal; Notable for the following components:   RBC 3.44 (*)    Hemoglobin 12.4 (*)    HCT 34.5 (*)    MCV 100.3 (*)    MCH 36.0 (*)    Platelets 141 (*)    All other components within normal limits  AMMONIA - Abnormal; Notable for the following components:   Ammonia 37 (*)    All other components within normal limits  SARS CORONAVIRUS 2 (TAT 6-24 HRS)  URINE DRUG SCREEN, QUALITATIVE ([facility] ONLY)     PROCEDURES  Procedure(s) performed (including Critical Care):  Procedures   ____________________________________________   INITIAL IMPRESSION / ASSESSMENT AND PLAN / ED COURSE  59 year old male presents to the ED for psychiatric evaluation under IVC after becoming increasingly aggressive with his  wife over the past couple of days.  He has a prior history of alcohol abuse and has recently begun drinking again over the past month, does have cirrhosis with prior TIPS procedure.  Labs thus far show LFTs similar to prior, patient A&O x3 but does seem to be acting erratically and we will check ammonia level.  If ammonia is within normal limits, he may be medically cleared for psychiatric evaluation.  Ammonia mildly elevated and patient remains confused, we will treat with high-dose thiamine and he would benefit from medical admission for hepatic encephalopathy.  Case was discussed with hospitalist for admission.      ____________________________________________   FINAL CLINICAL IMPRESSION(S) / ED DIAGNOSES  Final diagnoses:  Confusion  Alcohol abuse     ED Discharge Orders    None       Note:  This document was prepared using Dragon voice recognition software and may include unintentional dictation errors.   Blake Divine, MD 07/11/19 1421

## 2019-07-11 NOTE — H&P (Signed)
History and Physical    Adrian Hale SFK:[id number] DOB: May 11, 1960 DOA: 07/11/2019  Referring MD/NP/PA:   PCP: Hortencia Pilar, MD   Patient coming from:  The patient is coming from home.  At baseline, pt is independent for most of ADL.        Chief Complaint: AMS  HPI: Adrian Hale is a 59 y.o. male with medical history significant of hypertension, hyperlipidemia, COPD, GERD, depression, anxiety, tobacco abuse, alcohol abuse, seizure, portal hypertension, alcoholic liver cirrhosis, who presents with AMS.  Per his wife, patient has history of alcohol abuse, but he stopped drinking for many years until the past month, when he has been drinking on a daily basis. He arrives to the ED under IVC after he reportedly was aggressive to his wife. When I saw pt in ED, he is mildly confused, but still oriented x3. He denies any suicidal or homicidal ideation. He states that he has tenderness in the right lower rib cage.  Per his wife, pt likely fell when he was drunk. Patient has mild cough, denies shortness of breath, fever or chills.  Patient has loose stool which he attributes to lactulose use.  No nausea, vomiting, abdominal pain.  No symptoms of UTI   ED Course: pt was found to have ammonia level 37, WBC 6.9, electrolytes renal function okay, pending COVID-19 PCR, temperature 99, blood pressure 155/89, heart rate 59, RR 16, oxygen saturation 97% on room air.  Pending chest x-ray.  Patient is placed on MedSurg bed for observation   Review of Systems:   General: no fevers, chills, no body weight gain, fatigue HEENT: no blurry vision, hearing changes or sore throat Respiratory: no dyspnea, has coughing, no wheezing CV: has right lower chest wall pain, no palpitations GI: no nausea, vomiting, abdominal pain, diarrhea, constipation GU: no dysuria, burning on urination, increased urinary frequency, hematuria  Ext: no leg edema Neuro: no unilateral weakness, numbness, or tingling, no  vision change or hearing loss. Skin: no rash, no skin tear. MSK: No muscle spasm, no deformity, no limitation of range of movement in spin Has confusion.  Heme: No easy bruising.  Travel history: No recent long distant travel.  Allergy:  Allergies  Allergen Reactions  . Poison Ivy Extract   . Poison [employer]   . Poison Ivy Extract [Poison Ivy Extract] Rash    Past Medical History:  Diagnosis Date  . ALC (alcoholic liver cirrhosis) ([place])   . Alcohol abuse    NONE SINCE 08/28/2011 AND ATTENDING AA MEETINGS  . Alcoholic cirrhosis of liver ([place])   . Alcoholic peripheral neuropathy ([place])   . Allergy to poison ivy 06/06/2013   RASH  . Anxiety   . Aortic aneurysm ([place])   . Cellulitis of both lower extremities   . Complex partial seizure evolving to generalized seizure ([place])   . COPD (chronic obstructive pulmonary disease) ([place])   . Depression    MAJOR DEPRESSIVE DISORDER  . ED (erectile dysfunction)   . Hepatic encephalopathy ([place])   . Hyperlipidemia   . Hypertension    ESSENTIAL  . Maculopathy   . Marijuana abuse   . MRSA (methicillin resistant staph aureus) culture positive   . Obesity   . Portal hypertension ([place])   . Pseudophakia of both eyes   . Ptosis of both eyelids   . S/P TIPS (transjugular intrahepatic portosystemic shunt)   . Seizures ([place])    Last seizure April 06, 2013, [facility]  . Seizures [facility])   .  Sleep apnea    Does not use CPAP  . Thrombocytopenia, secondary   . Tobacco abuse   . Toxic maculopathy of right eye   . Ulcer of lower limb ([place])    ABSCESS OR CELLULITIS OF LEG    Past Surgical History:  Procedure Laterality Date  . BREAST SURGERY     BIOPSY  . CATARACT EXTRACTION Right 10/19/2011  . CATARACT EXTRACTION Left 02/22/2012  . COLONOSCOPY    . ESOPHAGOGASTRODUODENOSCOPY    . EYE SURGERY    . [place] Left 10/30/2014   Procedure: HALLUX VALGUS AUSTIN;  Surgeon: Samara Deist, DPM;  Location: [facility] ORS;  Service:  Podiatry;  Laterality: Left;  . LIVER SURGERY     TIPS PROCEDURE  . None    . TIPS PROCEDURE      Social History:  reports that he has been smoking cigarettes. He has a 7.50 pack-year smoking history. He has never used smokeless tobacco. He reports current alcohol use. He reports current drug use. Drug: Marijuana.  Family History:  Family History  Problem Relation Age of Onset  . Hypertension Mother   . Hypertension Father      Prior to Admission medications   Medication Sig Start Date End Date Taking? Authorizing Provider  acetaminophen (TYLENOL) 325 MG tablet Take 650 mg by mouth every 4 (four) hours as needed.    [provider]  albuterol (PROVENTIL HFA;VENTOLIN HFA) 108 (90 BASE) MCG/ACT inhaler Inhale 2 puffs into the lungs every 4 (four) hours as needed for wheezing or shortness of breath.    [provider]  albuterol (PROVENTIL) (5 MG/ML) 0.5% nebulizer solution Take 2.5 mg by nebulization every 4 (four) hours as needed for wheezing or shortness of breath.    [provider]  esomeprazole (NEXIUM) 40 MG capsule Take 40 mg by mouth daily.     [provider]  fluorouracil (EFUDEX) 5 % cream Apply 1 application topically 2 (two) times daily. 08/15/18   [provider]  fluticasone (FLONASE) 50 MCG/ACT nasal spray Place 1 spray into both nostrils daily.    [provider]  fluticasone (FLOVENT HFA) 110 MCG/ACT inhaler Inhale 1 puff into the lungs 2 (two) times daily.    [provider]  furosemide (LASIX) 20 MG tablet Take 20 mg by mouth 2 (two) times daily.    [provider]  HYDROcodone-acetaminophen (NORCO/VICODIN) 5-325 MG tablet Take 1 tablet by mouth every 4 (four) hours as needed for moderate pain. 04/09/19   Cuthriell, Charline Bills, PA-C  ketoconazole (NIZORAL) 2 % cream Apply 1 application topically 2 (two) times daily as needed.     [provider]  lactulose, encephalopathy, (ENULOSE) 10 GM/15ML  SOLN Take 20 g by mouth 3 (three) times daily.     [provider]  levETIRAcetam (KEPPRA XR) 500 MG 24 hr tablet Take 500 mg by mouth 2 (two) times daily.    [provider]  Melatonin-Pyridoxine (MELATIN PO) Take 1 tablet by mouth at bedtime as needed.    [provider]  meloxicam (MOBIC) 15 MG tablet Take 1 tablet (15 mg total) by mouth daily. 04/09/19   Cuthriell, Charline Bills, PA-C  MILK THISTLE PO Take 1 capsule by mouth at bedtime.     [provider]  propranolol (INDERAL) 20 MG tablet Take 20 mg by mouth daily.    [provider]  rifaximin (XIFAXAN) 550 MG TABS tablet Take 550 mg by mouth 2 (two) times daily.  [provider]  sertraline (ZOLOFT) 25 MG tablet Take 25 mg by mouth daily. 10/24/18   [provider]  spironolactone (ALDACTONE) 50 MG tablet Take 50 mg by mouth 2 (two) times daily.    [provider]  tadalafil (CIALIS) 20 MG tablet Take 20 mg by mouth daily as needed for erectile dysfunction.    [provider]  tizanidine (ZANAFLEX) 2 MG capsule Take 2 mg by mouth 3 (three) times daily as needed for muscle spasms.    [provider]  traMADol (ULTRAM) 50 MG tablet Take 50 mg by mouth 3 (three) times daily as needed. 10/31/18   [provider]  traZODone (DESYREL) 100 MG tablet Take 300 mg by mouth at bedtime. PATIENT TAKES 3 TABLETS    [provider]    Physical Exam: Vitals:   07/11/19 1056 07/11/19 1057  BP: (!) 155/89   Pulse: (!) 59   Resp: 16   Temp: 99 F (37.2 C)   TempSrc: Oral   SpO2: 97%   Weight:  82.6 kg  Height:  5\' 11"  (1.803 m)   General: Not in acute distress HEENT:       Eyes: PERRL, EOMI, no scleral icterus.       ENT: No discharge from the ears and nose, no pharynx injection, no tonsillar enlargement.        Neck: No JVD, no bruit, no mass felt. Heme: No neck lymph node enlargement. Cardiac: S1/S2, RRR, No murmurs, No gallops or  rubs. Respiratory:  No rales, wheezing, rhonchi or rubs. Chest wall: has tenderness in right lower rib cage. GI: Soft, nondistended, nontender, no rebound pain, no organomegaly, BS present. GU: No hematuria Ext: No pitting leg edema bilaterally. 2+DP/PT pulse bilaterally. Musculoskeletal: No joint deformities, No joint redness or warmth, no limitation of ROM in spin. Skin: No rashes.  Neuro: mildly confused, still oriented X3, cranial nerves II-XII grossly intact, moves all extremities normally. Psych: no suicidal or hemocidal ideation.  Labs on Admission: I have personally reviewed following labs and imaging studies  CBC: Recent Labs  Lab 07/11/19 1103  WBC 6.9  HGB 12.4*  HCT 34.5*  MCV 100.3*  PLT 962*   Basic Metabolic Panel: Recent Labs  Lab 07/11/19 1103  NA 135  K 3.9  CL 103  CO2 22  GLUCOSE 128*  BUN 7  CREATININE 0.72  CALCIUM 8.7*   GFR: Estimated Creatinine Clearance: 104.6 mL/min (by C-G formula based on SCr of 0.72 mg/dL). Liver Function Tests: Recent Labs  Lab 07/11/19 1103  AST 53*  ALT 35  ALKPHOS 107  BILITOT 2.2*  PROT 6.4*  ALBUMIN 3.2*   No results for input(s): LIPASE, AMYLASE in the last 168 hours. Recent Labs  Lab 07/11/19 1229  AMMONIA 37*   Coagulation Profile: No results for input(s): INR, PROTIME in the last 168 hours. Cardiac Enzymes: No results for input(s): CKTOTAL, CKMB, CKMBINDEX, TROPONINI in the last 168 hours. BNP (last 3 results) No results for input(s): PROBNP in the last 8760 hours. HbA1C: No results for input(s): HGBA1C in the last 72 hours. CBG: No results for input(s): GLUCAP in the last 168 hours. Lipid Profile: No results for input(s): CHOL, HDL, LDLCALC, TRIG, CHOLHDL, LDLDIRECT in the last 72 hours. Thyroid Function Tests: No results for input(s): TSH, T4TOTAL, FREET4, T3FREE, THYROIDAB in the last 72 hours. Anemia Panel: No results for input(s): VITAMINB12, FOLATE, FERRITIN, TIBC, IRON, RETICCTPCT in  the last 72 hours. Urine analysis:  Component Value Date/Time   COLORURINE YELLOW (A) 06/27/2019 2039   APPEARANCEUR CLEAR (A) 06/27/2019 2039   APPEARANCEUR Clear 11/09/2013 1230   LABSPEC 1.011 06/27/2019 2039   LABSPEC 1.006 11/09/2013 1230   PHURINE 5.0 06/27/2019 2039   GLUCOSEU NEGATIVE 06/27/2019 2039   GLUCOSEU Negative 11/09/2013 1230   HGBUR NEGATIVE 06/27/2019 2039   BILIRUBINUR NEGATIVE 06/27/2019 2039   BILIRUBINUR Negative 11/09/2013 [place] 06/27/2019 2039   PROTEINUR NEGATIVE 06/27/2019 2039   UROBILINOGEN 2.0 (H) 02/05/2008 0016   NITRITE NEGATIVE 06/27/2019 2039   LEUKOCYTESUR NEGATIVE 06/27/2019 2039   LEUKOCYTESUR Negative 11/09/2013 1230   Sepsis Labs: @LABRCNTIP (procalcitonin:4,lacticidven:4) )No results found for this or any previous visit (from the past 240 hour(s)).   Radiological Exams on Admission: No results found.   EKG:  Not done in ED, will get one.   Assessment/Plan Principal Problem:   Hepatic encephalopathy (HCC) Active Problems:   ALC (alcoholic liver cirrhosis) (HCC)   Seizures (HCC)   Tobacco abuse   COPD (chronic obstructive pulmonary disease) (HCC)   GERD (gastroesophageal reflux disease)   Depression   Alcohol abuse   Chest wall pain   Fall at home, initial encounter   Possible hepatic encephalopathy ([place]): pt has hx of alcoholic liver cirrhosis, now presents with altered mental status and elevated ammonia level, consistent with possible hepatic encephalopathy.  -will place on MedSurg bed for observation -Frequent neuro check -Start lactulose 30 g twice daily  ALC (alcoholic liver cirrhosis) ([place]): ALP 107, AST 53, ALT 35, total bilirubin 2.2 -check INR and PTT -continue propranolol, rifaximin -Hold Lasix and spironolactone now  Seizure -Seizure precaution -When necessary Ativan for seizure - will switch oral Keppra to IV Keppra 500 mg twice daily for now since patient may not be compliant of  taking medications  Tobacco abuse and Alcohol abuse: -Nicotine patch -CIWA protocol  COPD (chronic obstructive pulmonary disease) ([place]): Stable -Bronchodilators  GERD (gastroesophageal reflux disease) -Protonix  Depression and anxiety: Stable, no suicidal or homicidal ideations. -Continue home medications: Zoloft  Chest wall pain and possible fall: Patient has right lower rib cage pain, possibly due to fall -f/u CXR-portable -f/u CT-head -check trop just in case -pt is on as needed tramadol -PT/OT    DVT ppx: SQ Lovenox Code Status: Full code Family Communication: I called his wife   Disposition Plan:  Anticipate discharge back to previous environment Consults called:  none Admission status: Med-surg bed for obs    Status is: Observation  The patient remains OBS appropriate and will d/c before 2 midnights.  Dispo: The patient is from: Home              Anticipated d/c is to: Home              Anticipated d/c date is: 1 day              Patient currently is not medically stable to d/c.           Date of Service 07/11/2019    Ivor Costa [facility]   If 7PM-7AM, please contact night-coverage [email or web address] 07/11/2019, 4:31 PM

## 2019-07-11 NOTE — ED Notes (Signed)
He has returned from CT

## 2019-07-11 NOTE — ED Notes (Signed)
LISA, ED tech and security with pt I transport to admission bed 113.

## 2019-07-12 ENCOUNTER — Observation Stay: Payer: Medicare HMO

## 2019-07-12 DIAGNOSIS — R0789 Other chest pain: Secondary | ICD-10-CM | POA: Diagnosis not present

## 2019-07-12 DIAGNOSIS — F1010 Alcohol abuse, uncomplicated: Secondary | ICD-10-CM | POA: Diagnosis not present

## 2019-07-12 DIAGNOSIS — K7030 Alcoholic cirrhosis of liver without ascites: Secondary | ICD-10-CM | POA: Diagnosis not present

## 2019-07-12 DIAGNOSIS — K7290 Hepatic failure, unspecified without coma: Secondary | ICD-10-CM | POA: Diagnosis not present

## 2019-07-12 LAB — COMPREHENSIVE METABOLIC PANEL
ALT: 33 U/L (ref 0–44)
AST: 52 U/L — ABNORMAL HIGH (ref 15–41)
Albumin: 2.7 g/dL — ABNORMAL LOW (ref 3.5–5.0)
Alkaline Phosphatase: 95 U/L (ref 38–126)
Anion gap: 6 (ref 5–15)
BUN: 7 mg/dL (ref 6–20)
CO2: 24 mmol/L (ref 22–32)
Calcium: 8.4 mg/dL — ABNORMAL LOW (ref 8.9–10.3)
Chloride: 110 mmol/L (ref 98–111)
Creatinine, Ser: 0.64 mg/dL (ref 0.61–1.24)
GFR calc Af Amer: >60 mL/min (ref >60)
GFR calc non Af Amer: >60 mL/min (ref >60)
Glucose, Bld: 97 mg/dL (ref 70–99)
Potassium: 3.5 mmol/L (ref 3.5–5.1)
Sodium: 140 mmol/L (ref 135–145)
Total Bilirubin: 2.6 mg/dL — ABNORMAL HIGH (ref 0.3–1.2)
Total Protein: 5.2 g/dL — ABNORMAL LOW (ref 6.5–8.1)

## 2019-07-12 LAB — PROTIME-INR
INR: 1.4 — ABNORMAL HIGH (ref 0.8–1.2)
Prothrombin Time: 16.3 seconds — ABNORMAL HIGH (ref 11.4–15.2)

## 2019-07-12 LAB — CBC
HCT: 28.5 % — ABNORMAL LOW (ref 39.0–52.0)
Hemoglobin: 10.6 g/dL — ABNORMAL LOW (ref 13.0–17.0)
MCH: 36.3 pg — ABNORMAL HIGH (ref 26.0–34.0)
MCHC: 37.2 g/dL — ABNORMAL HIGH (ref 30.0–36.0)
MCV: 97.6 fL (ref 80.0–100.0)
Platelets: 96 10*3/uL — ABNORMAL LOW (ref 150–400)
RBC: 2.92 MIL/uL — ABNORMAL LOW (ref 4.22–5.81)
RDW: 13.6 % (ref 11.5–15.5)
WBC: 4.9 10*3/uL (ref 4.0–10.5)
nRBC: 0 % (ref 0.0–0.2)

## 2019-07-12 LAB — HIV ANTIBODY (ROUTINE TESTING W REFLEX): HIV Screen 4th Generation wRfx: NONREACTIVE

## 2019-07-12 LAB — APTT: aPTT: 40 seconds — ABNORMAL HIGH (ref 24–36)

## 2019-07-12 MED ORDER — FOLIC ACID 1 MG PO TABS
1.0000 mg | ORAL_TABLET | Freq: Every day | ORAL | Status: DC
  Administered 2019-07-13 – 2019-07-14 (×2): 1 mg via ORAL
  Filled 2019-07-12 (×2): qty 1

## 2019-07-12 MED ORDER — LIDOCAINE 5 % EX PTCH
1.0000 | MEDICATED_PATCH | CUTANEOUS | Status: DC
  Administered 2019-07-12 – 2019-07-13 (×2): 1 via TRANSDERMAL
  Filled 2019-07-12 (×3): qty 1
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
## 2019-07-12 NOTE — Care Management Obs Status (Signed)
[place] NOTIFICATION   Patient Details  Name: MAAZ SPIERING MRN: [id number] Date of Birth: Mar 25, 1960   Medicare Observation Status Notification Given:  Yes    Shelbie Hutching, RN 07/12/2019, 3:30 PM
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
## 2019-07-12 NOTE — Progress Notes (Signed)
OT Cancellation Note  Patient Details Name: Adrian Hale MRN: [id number] DOB: November 08, 1960   Cancelled Treatment:    Reason Eval/Treat Not Completed: Patient declined, no reason specified;Fatigue/lethargy limiting ability to participate. Consult received, chart reviewed. Upon attempt, pt sleeping, sitter in room. Pt wakes easily to therapist' voice. Reporting feeling dizzy and fatigued. Politely declines participation. Encouragement given and pt unable to keep eyes open without cues. Will re-attempt OT evaluation at later date/time as pt is able to participate and medically appropriate.   Jeni Salles, MPH, MS, OTR/L ascom [phone] 07/12/19, 1:35 PM

## 2019-07-13 DIAGNOSIS — F1010 Alcohol abuse, uncomplicated: Secondary | ICD-10-CM | POA: Diagnosis not present

## 2019-07-13 DIAGNOSIS — K7290 Hepatic failure, unspecified without coma: Secondary | ICD-10-CM | POA: Diagnosis not present

## 2019-07-13 LAB — COMPREHENSIVE METABOLIC PANEL
ALT: 34 U/L (ref 0–44)
AST: 52 U/L — ABNORMAL HIGH (ref 15–41)
Albumin: 2.7 g/dL — ABNORMAL LOW (ref 3.5–5.0)
Alkaline Phosphatase: 91 U/L (ref 38–126)
Anion gap: 8 (ref 5–15)
BUN: 10 mg/dL (ref 6–20)
CO2: 22 mmol/L (ref 22–32)
Calcium: 8.5 mg/dL — ABNORMAL LOW (ref 8.9–10.3)
Chloride: 109 mmol/L (ref 98–111)
Creatinine, Ser: 0.64 mg/dL (ref 0.61–1.24)
GFR calc Af Amer: >60 mL/min (ref >60)
GFR calc non Af Amer: >60 mL/min (ref >60)
Glucose, Bld: 86 mg/dL (ref 70–99)
Potassium: 3.5 mmol/L (ref 3.5–5.1)
Sodium: 139 mmol/L (ref 135–145)
Total Bilirubin: 2.2 mg/dL — ABNORMAL HIGH (ref 0.3–1.2)
Total Protein: 5.1 g/dL — ABNORMAL LOW (ref 6.5–8.1)

## 2019-07-13 LAB — CBC
HCT: 28.2 % — ABNORMAL LOW (ref 39.0–52.0)
Hemoglobin: 10.5 g/dL — ABNORMAL LOW (ref 13.0–17.0)
MCH: 36.3 pg — ABNORMAL HIGH (ref 26.0–34.0)
MCHC: 37.2 g/dL — ABNORMAL HIGH (ref 30.0–36.0)
MCV: 97.6 fL (ref 80.0–100.0)
Platelets: 84 10*3/uL — ABNORMAL LOW (ref 150–400)
RBC: 2.89 MIL/uL — ABNORMAL LOW (ref 4.22–5.81)
RDW: 13.4 % (ref 11.5–15.5)
WBC: 4.6 10*3/uL (ref 4.0–10.5)
nRBC: 0 % (ref 0.0–0.2)

## 2019-07-13 MED ORDER — ENSURE ENLIVE PO LIQD
237.0000 mL | Freq: Two times a day (BID) | ORAL | Status: DC
  Administered 2019-07-13 – 2019-07-14 (×2): 237 mL via ORAL

## 2019-07-13 NOTE — Plan of Care (Signed)

## 2019-07-13 NOTE — Consult Note (Signed)
[facility] Face-to-Face Psychiatry Consult   Reason for Consult:   Psych issues and Dual Diagnosis Marital discord ETOH issues  Referring Physician:   IM team  Patient Identification: Adrian Hale MRN:  [id number] Principal Diagnosis: Hepatic encephalopathy ([place]) Diagnosis:  Principal Problem:   Hepatic encephalopathy ([place]) Active Problems:   ALC (alcoholic liver cirrhosis) (HCC)   Seizures (HCC)   Tobacco abuse   COPD (chronic obstructive pulmonary disease) (HCC)   GERD (gastroesophageal reflux disease)   Depression   Alcohol abuse   Chest wall pain   Fall at home, initial encounter   Total Time spent with patient: 30-40 minutes     Subjective:   KASHIS PENLEY is a 59 y.o. male patient admitted with   ETOH dependence and withdrawal, Issues of major depression and marital discord  Now IVC has been rescinded and he may go home today.  Not clear if he will go to rehab or not   I tried to call numerous home phone numbers and they are all disconnected for his wife --so far     HPI:  As above --had recent ETOH relapse and in the process --had arguments and discord with his wife.  Hence had IVC onboard when he came.  He is now finishing detox but it is not clear if he can go home just yet     Past Psychiatric History: Numerous relapses of ETOH had been sober for 7 years now had relapse   Risk to Self:  none  Risk to Others:  none  Now  Prior Inpatient Therapy:   Prior Outpatient Therapy:    Past Medical History:  Past Medical History:  Diagnosis Date  . ALC (alcoholic liver cirrhosis) ([place])   . Alcohol abuse    NONE SINCE 08/28/2011 AND ATTENDING AA MEETINGS  . Alcoholic cirrhosis of liver ([place])   . Alcoholic peripheral neuropathy ([place])   . Allergy to poison ivy 06/06/2013   RASH  . Anxiety   . Aortic aneurysm ([place])   . Cellulitis of both lower extremities   . Complex partial seizure evolving to generalized seizure ([place])   . COPD (chronic obstructive pulmonary  disease) ([place])   . Depression    MAJOR DEPRESSIVE DISORDER  . ED (erectile dysfunction)   . Hepatic encephalopathy ([place])   . Hyperlipidemia   . Hypertension    ESSENTIAL  . Maculopathy   . Marijuana abuse   . MRSA (methicillin resistant staph aureus) culture positive   . Obesity   . Portal hypertension ([place])   . Pseudophakia of both eyes   . Ptosis of both eyelids   . S/P TIPS (transjugular intrahepatic portosystemic shunt)   . Seizures ([place])    Last seizure April 06, 2013, [facility]  . Seizures [facility])   . Sleep apnea    Does not use CPAP  . Thrombocytopenia, secondary   . Tobacco abuse   . Toxic maculopathy of right eye   . Ulcer of lower limb ([place])    ABSCESS OR CELLULITIS OF LEG    Past Surgical History:  Procedure Laterality Date  . BREAST SURGERY     BIOPSY  . CATARACT EXTRACTION Right 10/19/2011  . CATARACT EXTRACTION Left 02/22/2012  . COLONOSCOPY    . ESOPHAGOGASTRODUODENOSCOPY    . EYE SURGERY    . New [place]uwe Left 10/30/2014   Procedure: HALLUX VALGUS AUSTIN;  Surgeon: Samara Deist, DPM;  Location: [facility] ORS;  Service: Podiatry;  Laterality: Left;  . LIVER  SURGERY     TIPS PROCEDURE  . None    . TIPS PROCEDURE     Family History:  Family History  Problem Relation Age of Onset  . Hypertension Mother   . Hypertension Father    Family Psychiatric  History:  Social History:  Social History   Substance and Sexual Activity  Alcohol Use Yes   Comment: Twice a month.  He used to drink heavily in the past.     Social History   Substance and Sexual Activity  Drug Use Yes  . Types: Marijuana    Social History   Socioeconomic History  . Marital status: Married    Spouse name: Not on file  . Number of children: Not on file  . Years of education: Not on file  . Highest education level: Not on file  Occupational History  . Not on file  Tobacco Use  . Smoking status: Current Every Day Smoker    Packs/day: 0.50    Years: 15.00     Pack years: 7.50    Types: Cigarettes  . Smokeless tobacco: Never Used  Substance and Sexual Activity  . Alcohol use: Yes    Comment: Twice a month.  He used to drink heavily in the past.  . Drug use: Yes    Types: Marijuana  . Sexual activity: Not on file  Other Topics Concern  . Not on file  Social History Narrative   ** Merged History Encounter **       Social Determinants of Health   Financial Resource Strain:   . Difficulty of Paying Living Expenses:   Food Insecurity:   . Worried About Charity fundraiser in the Last Year:   . Arboriculturist in the Last Year:   Transportation Needs:   . Film/video editor (Medical):   Marland Kitchen Lack of Transportation (Non-Medical):   Physical Activity:   . Days of Exercise per Week:   . Minutes of Exercise per Session:   Stress:   . Feeling of Stress :   Social Connections:   . Frequency of Communication with Friends and Family:   . Frequency of Social Gatherings with Friends and Family:   . Attends Religious Services:   . Active Member of Clubs or Organizations:   . Attends Archivist Meetings:   Marland Kitchen Marital Status:    Additional Social History:    Allergies:   Allergies  Allergen Reactions  . Poison Ivy Extract   . Poison [employer]   . Poison Ivy Extract [Poison Ivy Extract] Rash    Labs:  Results for orders placed or performed during the hospital encounter of 07/11/19 (from the past 48 hour(s))  Magnesium     Status: None   Collection Time: 07/11/19 10:11 PM  Result Value Ref Range   Magnesium 1.8 1.7 - 2.4 mg/dL    Comment: Performed at [facility], [address] [place], [place] [address]  Phosphorus     Status: None   Collection Time: 07/11/19 10:11 PM  Result Value Ref Range   Phosphorus 3.0 2.5 - 4.6 mg/dL    Comment: Performed at [facility], [place], [place] [address]  Troponin I (High Sensitivity)     Status: None   Collection Time: 07/11/19 10:11 PM  Result  Value Ref Range   Troponin I (High Sensitivity) 15 <18 ng/L    Comment: (NOTE) Elevated high sensitivity troponin I (hsTnI) values and significant  changes across serial measurements may suggest ACS but many other  chronic and acute conditions are known to elevate hsTnI results.  Refer to the "Links" section for chest pain algorithms and additional  guidance. Performed at [facility], [address] [place], [place] [address]   HIV Antibody (routine testing w rflx)     Status: None   Collection Time: 07/11/19 10:11 PM  Result Value Ref Range   HIV Screen 4th Generation wRfx Non Reactive Non Reactive    Comment: Performed at [facility], [address], [place], [place] [address]  Comprehensive metabolic panel     Status: Abnormal   Collection Time: 07/12/19  4:42 AM  Result Value Ref Range   Sodium 140 135 - 145 mmol/L   Potassium 3.5 3.5 - 5.1 mmol/L   Chloride 110 98 - 111 mmol/L   CO2 24 22 - 32 mmol/L   Glucose, Bld 97 70 - 99 mg/dL    Comment: Glucose reference range applies only to samples taken after fasting for at least 8 hours.   BUN 7 6 - 20 mg/dL   Creatinine, Ser 0.64 0.61 - 1.24 mg/dL   Calcium 8.4 (L) 8.9 - 10.3 mg/dL   Total Protein 5.2 (L) 6.5 - 8.1 g/dL   Albumin 2.7 (L) 3.5 - 5.0 g/dL   AST 52 (H) 15 - 41 U/L   ALT 33 0 - 44 U/L   Alkaline Phosphatase 95 38 - 126 U/L   Total Bilirubin 2.6 (H) 0.3 - 1.2 mg/dL   GFR calc non Af Amer >60 >60 mL/min   GFR calc Af Amer >60 >60 mL/min   Anion gap 6 5 - 15    Comment: Performed at [facility], [address] [place], [place] [address]  CBC     Status: Abnormal   Collection Time: 07/12/19  4:42 AM  Result Value Ref Range   WBC 4.9 4.0 - 10.5 K/uL   RBC 2.92 (L) 4.22 - 5.81 MIL/uL   Hemoglobin 10.6 (L) 13.0 - 17.0 g/dL   HCT 28.5 (L) 39 - 52 %   MCV 97.6 80.0 - 100.0 fL   MCH 36.3 (H) 26.0 - 34.0 pg   MCHC 37.2 (H) 30.0 - 36.0 g/dL   RDW 13.6 11.5 - 15.5 %   Platelets 96 (L) 150  - 400 K/uL    Comment: Immature Platelet Fraction may be clinically indicated, consider ordering this additional test [id number] PLATELET COUNT CONFIRMED BY SMEAR    nRBC 0.0 0.0 - 0.2 %    Comment: Performed at [facility], [address] [place], [place] [address]  Protime-INR     Status: Abnormal   Collection Time: 07/12/19  4:42 AM  Result Value Ref Range   Prothrombin Time 16.3 (H) 11.4 - 15.2 seconds   INR 1.4 (H) 0.8 - 1.2    Comment: (NOTE) INR goal varies based on device and disease states. Performed at [facility], [address] [place], [place] [address]   APTT     Status: Abnormal   Collection Time: 07/12/19  4:42 AM  Result Value Ref Range   aPTT 40 (H) 24 - 36 seconds    Comment:        IF BASELINE aPTT IS ELEVATED, SUGGEST PATIENT RISK ASSESSMENT BE USED TO DETERMINE APPROPRIATE ANTICOAGULANT THERAPY. Performed at [facility], [address] [place],  [address]   Comprehensive metabolic panel     Status:  Abnormal   Collection Time: 07/13/19  5:57 AM  Result Value Ref Range   Sodium 139 135 - 145 mmol/L   Potassium 3.5 3.5 - 5.1 mmol/L   Chloride 109 98 - 111 mmol/L   CO2 22 22 - 32 mmol/L   Glucose, Bld 86 70 - 99 mg/dL    Comment: Glucose reference range applies only to samples taken after fasting for at least 8 hours.   BUN 10 6 - 20 mg/dL   Creatinine, Ser 0.64 0.61 - 1.24 mg/dL   Calcium 8.5 (L) 8.9 - 10.3 mg/dL   Total Protein 5.1 (L) 6.5 - 8.1 g/dL   Albumin 2.7 (L) 3.5 - 5.0 g/dL   AST 52 (H) 15 - 41 U/L   ALT 34 0 - 44 U/L   Alkaline Phosphatase 91 38 - 126 U/L   Total Bilirubin 2.2 (H) 0.3 - 1.2 mg/dL   GFR calc non Af Amer >60 >60 mL/min   GFR calc Af Amer >60 >60 mL/min   Anion gap 8 5 - 15    Comment: Performed at [facility], [address] [place], [place] [address]  CBC     Status: Abnormal   Collection Time: 07/13/19  5:57 AM  Result Value Ref Range   WBC 4.6 4.0 - 10.5 K/uL    RBC 2.89 (L) 4.22 - 5.81 MIL/uL   Hemoglobin 10.5 (L) 13.0 - 17.0 g/dL   HCT 28.2 (L) 39 - 52 %   MCV 97.6 80.0 - 100.0 fL   MCH 36.3 (H) 26.0 - 34.0 pg   MCHC 37.2 (H) 30.0 - 36.0 g/dL   RDW 13.4 11.5 - 15.5 %   Platelets 84 (L) 150 - 400 K/uL    Comment: Immature Platelet Fraction may be clinically indicated, consider ordering this additional test [id number]    nRBC 0.0 0.0 - 0.2 %    Comment: Performed at [facility], [address] [place], [place] [address]    Current Facility-Administered Medications  Medication Dose Route Frequency Provider Last Rate Last Admin  . albuterol (PROVENTIL) (2.5 MG/3ML) 0.083% nebulizer solution 2.5 mg  2.5 mg Inhalation Q4H PRN Ivor Costa, MD      . enoxaparin (LOVENOX) injection 40 mg  40 mg Subcutaneous Q24H Ivor Costa, MD   40 mg at 07/12/19 2255  . feeding supplement (ENSURE ENLIVE) (ENSURE ENLIVE) liquid 237 mL  237 mL Oral BID BM Nicole Kindred A, DO      . fluticasone (FLONASE) 50 MCG/ACT nasal spray 1 spray  1 spray Each Nare Daily PRN Ivor Costa, MD      . folic acid (FOLVITE) tablet 1 mg  1 mg Oral Daily Nicole Kindred A, DO   1 mg at 07/13/19 1030  . hydrALAZINE (APRESOLINE) injection 5 mg  5 mg Intravenous Q2H PRN Ivor Costa, MD      . lactulose ([place]) 10 GM/15ML solution 30 g  30 g Oral BID Ivor Costa, MD   30 g at 07/13/19 1027  . levETIRAcetam (KEPPRA) IVPB 500 mg/100 mL premix  500 mg Intravenous Q12H Ivor Costa, MD 400 mL/hr at 07/13/19 1600 500 mg at 07/13/19 1600  . lidocaine (LIDODERM) 5 % 1 patch  1 patch Transdermal Q24H Nicole Kindred A, DO   1 patch at 07/13/19 1601  . LORazepam (ATIVAN) injection 0-4 mg  0-4 mg Intravenous Q12H Ivor Costa, MD      . LORazepam (ATIVAN) injection 1 mg  1 mg Intravenous  Q2H PRN Ivor Costa, MD      . LORazepam (ATIVAN) tablet 1-4 mg  1-4 mg Oral Q1H PRN Ivor Costa, MD       Or  . LORazepam (ATIVAN) injection 1-4 mg  1-4 mg Intravenous Q1H PRN Ivor Costa, MD      . melatonin  tablet 10 mg  10 mg Oral QHS Ivor Costa, MD   10 mg at 07/12/19 2256  . multivitamin with minerals tablet 1 tablet  1 tablet Oral Daily Ivor Costa, MD   1 tablet at 07/13/19 1029  . nicotine (NICODERM CQ - dosed in mg/24 hours) patch 21 mg  21 mg Transdermal Daily Ivor Costa, MD   21 mg at 07/13/19 1030  . ondansetron (ZOFRAN) injection 4 mg  4 mg Intravenous Q8H PRN Ivor Costa, MD      . pneumococcal 23 valent vaccine (PNEUMOVAX-23) injection 0.5 mL  0.5 mL Intramuscular Tomorrow-1000 Ivor Costa, MD      . propranolol (INDERAL) tablet 20 mg  20 mg Oral Daily Ivor Costa, MD   20 mg at 07/13/19 1030  . rifaximin (XIFAXAN) tablet 550 mg  550 mg Oral BID Ivor Costa, MD   550 mg at 07/13/19 1029  . sertraline (ZOLOFT) tablet 50 mg  50 mg Oral Daily Ivor Costa, MD   50 mg at 07/13/19 1030  . thiamine tablet 100 mg  100 mg Oral Daily Ivor Costa, MD   100 mg at 07/13/19 1029   Or  . thiamine (B-1) injection 100 mg  100 mg Intravenous Daily Ivor Costa, MD      . traMADol Veatrice Bourbon) tablet 50 mg  50 mg Oral TID PRN Ivor Costa, MD   50 mg at 07/13/19 1613  . traZODone (DESYREL) tablet 300 mg  300 mg Oral QHS Ivor Costa, MD   300 mg at 07/12/19 2256  . vitamin E capsule 400 Units  400 Units Oral QPM Ivor Costa, MD   400 Units at 07/12/19 1851  . zonisamide (ZONEGRAN) capsule 400 mg  400 mg Oral QHS Ivor Costa, MD   400 mg at 07/12/19 2256    Musculoskeletal: Strength & Muscle Tone:  Not known  Gait & Station:  Not known  Patient leans:  N/a   Psychiatric Specialty Exam: Physical Exam  Review of Systems  Blood pressure 121/70, pulse 64, temperature 98 F (36.7 C), temperature source Oral, resp. rate 16, height 5\' 11"  (1.803 m), weight 82.6 kg, SpO2 98 %.Body mass index is 25.38 kg/m.    Mental Status     Alert somewhat cooperative oriented to person and part of date Sensorium ---not clouded or fluctuant Appearance --looks haggard, unkept forlorn Older than stated age  Speech normal  Rapport  fair  Mood and affect somewhat depressed and anxious  Thought process and content --no frank psychosis but is circumstantial  And over inclusive SI and HI --none now contracts for safety  Judgement insight reliability fair to poor  Abstraction --somewhat concrete Memory --remote intact --makes some errors with recent memory  Concentration and attention fair  Consciousness not clouded or fluctuant  Intelligence and fund of knowledge average to below average  Movements --no current shakes and tremors  Treatment Plan Summary:  Post detox from ETOH --possible discharge IVC rescinded as he is not danger to others at this   Disposition: ---  He says he wants to go home with brother picking him up.  I am not able to get ahold of his wife just yet as numbers currently are not connected  Recommended he go to Rehab directly await SW referral for this     Eulas Post, MD 07/13/2019 4:15 PM

## 2019-07-13 NOTE — Progress Notes (Signed)
OT Cancellation Note  Patient Details Name: Adrian Hale MRN: [id number] DOB: 06-07-1960   Cancelled Treatment:    Reason Eval/Treat Not Completed: Other (comment). Upon attempt this am, pt seated EOB with nursing for medications. Pt endorsing lightheadedness. Nursing taking vitals and finishing medication administration. Will re-attempt OT evaluation at later date/time as pt is available and as schedule permits.  Jeni Salles, MPH, MS, OTR/L ascom [phone] 07/13/19, 11:23 AM
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
## 2019-07-13 NOTE — Progress Notes (Signed)
Stated he  is ok now.

## 2019-07-14 DIAGNOSIS — R0789 Other chest pain: Secondary | ICD-10-CM | POA: Diagnosis not present

## 2019-07-14 DIAGNOSIS — F1010 Alcohol abuse, uncomplicated: Secondary | ICD-10-CM | POA: Diagnosis not present

## 2019-07-14 DIAGNOSIS — K7290 Hepatic failure, unspecified without coma: Secondary | ICD-10-CM | POA: Diagnosis not present

## 2019-07-14 MED ORDER — LACTULOSE ENCEPHALOPATHY 10 GM/15ML PO SOLN: 30.0000 g | Freq: Two times a day (BID) | ORAL | 1 refills | Status: DC

## 2019-07-14 MED ORDER — ENSURE ENLIVE PO LIQD: 237.0000 mL | Freq: Two times a day (BID) | ORAL | 12 refills | Status: DC

## 2019-07-14 MED ORDER — ADULT MULTIVITAMIN W/MINERALS CH: 1.0000 | ORAL_TABLET | Freq: Every day | ORAL | Status: DC
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
## 2019-07-14 NOTE — Progress Notes (Signed)
Patient discharged home. Patient transported via POV by brother-in-law Juanda Crumble. After Visit Summary reviewed with patient. Patient has no questions at this time.

## 2019-07-14 NOTE — Plan of Care (Signed)
  Problem: Education: Goal: Knowledge of General Education information will improve Description: Including pain rating scale, medication(s)/side effects and non-pharmacologic comfort measures 07/14/2019 1309 by Jenene Slicker, RN Outcome: Adequate for Discharge 07/14/2019 1308 by Jenene Slicker, RN Outcome: Adequate for Discharge   Problem: Health Behavior/Discharge Planning: Goal: Ability to manage health-related needs will improve 07/14/2019 1309 by Jenene Slicker, RN Outcome: Adequate for Discharge 07/14/2019 1308 by Jenene Slicker, RN Outcome: Adequate for Discharge   Problem: Clinical Measurements: Goal: Ability to maintain clinical measurements within normal limits will improve 07/14/2019 1309 by Jenene Slicker, RN Outcome: Adequate for Discharge 07/14/2019 1308 by Jenene Slicker, RN Outcome: Adequate for Discharge Goal: Will remain free from infection 07/14/2019 1309 by Jenene Slicker, RN Outcome: Adequate for Discharge 07/14/2019 1308 by Jenene Slicker, RN Outcome: Adequate for Discharge Goal: Diagnostic test results will improve 07/14/2019 1309 by Jenene Slicker, RN Outcome: Adequate for Discharge 07/14/2019 1308 by Jenene Slicker, RN Outcome: Adequate for Discharge Goal: Respiratory complications will improve 07/14/2019 1309 by Jenene Slicker, RN Outcome: Adequate for Discharge 07/14/2019 1308 by Jenene Slicker, RN Outcome: Adequate for Discharge Goal: Cardiovascular complication will be avoided 07/14/2019 1309 by Jenene Slicker, RN Outcome: Adequate for Discharge 07/14/2019 1308 by Jenene Slicker, RN Outcome: Adequate for Discharge   Problem: Activity: Goal: Risk for activity intolerance will decrease 07/14/2019 1309 by Jenene Slicker, RN Outcome: Adequate for Discharge 07/14/2019 1308 by Jenene Slicker, RN Outcome: Adequate for Discharge   Problem: Nutrition: Goal: Adequate nutrition will be maintained 07/14/2019 1309 by Jenene Slicker, RN Outcome: Adequate for  Discharge 07/14/2019 1308 by Jenene Slicker, RN Outcome: Adequate for Discharge   Problem: Coping: Goal: Level of anxiety will decrease 07/14/2019 1309 by Jenene Slicker, RN Outcome: Adequate for Discharge 07/14/2019 1308 by Jenene Slicker, RN Outcome: Adequate for Discharge   Problem: Elimination: Goal: Will not experience complications related to bowel motility 07/14/2019 1309 by Jenene Slicker, RN Outcome: Adequate for Discharge 07/14/2019 1308 by Jenene Slicker, RN Outcome: Adequate for Discharge Goal: Will not experience complications related to urinary retention 07/14/2019 1309 by Jenene Slicker, RN Outcome: Adequate for Discharge 07/14/2019 1308 by Jenene Slicker, RN Outcome: Adequate for Discharge   Problem: Pain Managment: Goal: General experience of comfort will improve 07/14/2019 1309 by Jenene Slicker, RN Outcome: Adequate for Discharge 07/14/2019 1308 by Jenene Slicker, RN Outcome: Adequate for Discharge   Problem: Safety: Goal: Ability to remain free from injury will improve 07/14/2019 1309 by Jenene Slicker, RN Outcome: Adequate for Discharge 07/14/2019 1308 by Jenene Slicker, RN Outcome: Adequate for Discharge   Problem: Skin Integrity: Goal: Risk for impaired skin integrity will decrease 07/14/2019 1309 by Jenene Slicker, RN Outcome: Adequate for Discharge 07/14/2019 1308 by Jenene Slicker, RN Outcome: Adequate for Discharge
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
## 2019-10-12 HISTORY — PX: OTHER SURGICAL HISTORY: SHX169

## 2019-10-28 ENCOUNTER — Other Ambulatory Visit: Payer: Self-pay

## 2019-10-28 ENCOUNTER — Emergency Department: Payer: Medicare HMO

## 2019-10-28 ENCOUNTER — Emergency Department
Admission: EM | Admit: 2019-10-28 | Discharge: 2019-10-28 | Disposition: A | Discharge status: Home / Self Care | Payer: Medicare HMO | Attending: Emergency Medicine | Admitting: Emergency Medicine

## 2019-10-28 DIAGNOSIS — G40909 Epilepsy, unspecified, not intractable, without status epilepticus: Secondary | ICD-10-CM

## 2019-10-28 DIAGNOSIS — Z79899 Other long term (current) drug therapy: Secondary | ICD-10-CM | POA: Insufficient documentation

## 2019-10-28 DIAGNOSIS — I10 Essential (primary) hypertension: Secondary | ICD-10-CM | POA: Insufficient documentation

## 2019-10-28 DIAGNOSIS — Z7951 Long term (current) use of inhaled steroids: Secondary | ICD-10-CM | POA: Insufficient documentation

## 2019-10-28 DIAGNOSIS — J449 Chronic obstructive pulmonary disease, unspecified: Secondary | ICD-10-CM | POA: Diagnosis not present

## 2019-10-28 DIAGNOSIS — M25552 Pain in left hip: Secondary | ICD-10-CM | POA: Diagnosis present

## 2019-10-28 DIAGNOSIS — F17210 Nicotine dependence, cigarettes, uncomplicated: Secondary | ICD-10-CM | POA: Diagnosis not present

## 2019-10-28 LAB — CBC WITH DIFFERENTIAL/PLATELET
Abs Immature Granulocytes: 0.02 10*3/uL (ref 0.00–0.07)
Basophils Absolute: 0.1 10*3/uL (ref 0.0–0.1)
Basophils Relative: 1 %
Eosinophils Absolute: 0.2 10*3/uL (ref 0.0–0.5)
Eosinophils Relative: 4 %
HCT: 35.4 % — ABNORMAL LOW (ref 39.0–52.0)
Hemoglobin: 12.1 g/dL — ABNORMAL LOW (ref 13.0–17.0)
Immature Granulocytes: 0 %
Lymphocytes Relative: 28 %
Lymphs Abs: 1.5 10*3/uL (ref 0.7–4.0)
MCH: 34.7 pg — ABNORMAL HIGH (ref 26.0–34.0)
MCHC: 34.2 g/dL (ref 30.0–36.0)
MCV: 101.4 fL — ABNORMAL HIGH (ref 80.0–100.0)
Monocytes Absolute: 0.7 10*3/uL (ref 0.1–1.0)
Monocytes Relative: 12 %
Neutro Abs: 3 10*3/uL (ref 1.7–7.7)
Neutrophils Relative %: 55 %
Platelets: 110 10*3/uL — ABNORMAL LOW (ref 150–400)
RBC: 3.49 MIL/uL — ABNORMAL LOW (ref 4.22–5.81)
RDW: 13.7 % (ref 11.5–15.5)
WBC: 5.5 10*3/uL (ref 4.0–10.5)
nRBC: 0 % (ref 0.0–0.2)

## 2019-10-28 LAB — ETHANOL: Alcohol, Ethyl (B): <10 mg/dL (ref <10)

## 2019-10-28 LAB — BASIC METABOLIC PANEL
Anion gap: 6 (ref 5–15)
BUN: 8 mg/dL (ref 6–20)
CO2: 28 mmol/L (ref 22–32)
Calcium: 8.8 mg/dL — ABNORMAL LOW (ref 8.9–10.3)
Chloride: 106 mmol/L (ref 98–111)
Creatinine, Ser: 0.67 mg/dL (ref 0.61–1.24)
GFR, Estimated: >60 mL/min (ref >60)
Glucose, Bld: 111 mg/dL — ABNORMAL HIGH (ref 70–99)
Potassium: 3.6 mmol/L (ref 3.5–5.1)
Sodium: 140 mmol/L (ref 135–145)

## 2019-10-28 MED ORDER — LIDOCAINE 5 % EX PTCH: 1.0000 | MEDICATED_PATCH | CUTANEOUS | Status: DC

## 2019-10-28 MED ORDER — LIDOCAINE 5 % EX PTCH: 1.0000 | MEDICATED_PATCH | Freq: Two times a day (BID) | CUTANEOUS | 0 refills | Status: DC

## 2019-10-28 NOTE — ED Provider Notes (Signed)
[facility] Emergency Department Provider Note   ____________________________________________   First MD Initiated Contact with Patient 10/28/19 1214     (approximate)  I have reviewed the triage vital signs and the nursing notes.   HISTORY  Chief Complaint left hip pain and Seizures    HPI Adrian Hale is a 59 y.o. male with past medical history of hypertension, hyperlipidemia, COPD, GERD, alcohol abuse, seizures, cirrhosis status post TIPS, and hepatocellular carcinoma who presents to the ED complaining of hip pain.  Patient reports he had a seizure about 8 days ago where he fell to the ground and struck his left hip.  He has been dealing with pain at the left hip since then whenever he moves his left leg, but he states he has been walking without difficulty.  He has not noticed any bruising or swelling to the hip and denies any other injuries from the fall.  He states he is compliant with his seizure medication and has not missed any doses recently.  Speaking with his wife over the phone, she witnessed the seizure episode 8 days ago and confirms he hit his left hip but did not hit his head.  She states he will drink alcohol occasionally, but has not had a drink in about 2 weeks, and has been acting appropriately per his usual.        Past Medical History:  Diagnosis Date  . ALC (alcoholic liver cirrhosis) ([place])   . Alcohol abuse    NONE SINCE 08/28/2011 AND ATTENDING AA MEETINGS  . Alcoholic cirrhosis of liver ([place])   . Alcoholic peripheral neuropathy ([place])   . Allergy to poison ivy 06/06/2013   RASH  . Anxiety   . Aortic aneurysm ([place])   . Cellulitis of both lower extremities   . Complex partial seizure evolving to generalized seizure ([place])   . COPD (chronic obstructive pulmonary disease) ([place])   . Depression    MAJOR DEPRESSIVE DISORDER  . ED (erectile dysfunction)   . Hepatic encephalopathy ([place])   . Hyperlipidemia   . Hypertension     ESSENTIAL  . Maculopathy   . Marijuana abuse   . MRSA (methicillin resistant staph aureus) culture positive   . Obesity   . Portal hypertension ([place])   . Pseudophakia of both eyes   . Ptosis of both eyelids   . S/P TIPS (transjugular intrahepatic portosystemic shunt)   . Seizures ([place])    Last seizure April 06, 2013, [facility]  . Seizures [facility])   . Sleep apnea    Does not use CPAP  . Thrombocytopenia, secondary   . Tobacco abuse   . Toxic maculopathy of right eye   . Ulcer of lower limb ([place])    ABSCESS OR CELLULITIS OF LEG    Patient Active Problem List   Diagnosis Date Noted  . Chest wall pain 07/11/2019  . Fall at home, initial encounter 07/11/2019  . COPD (chronic obstructive pulmonary disease) ([place])   . GERD (gastroesophageal reflux disease)   . Depression   . Alcohol abuse   . Confusion   . ALC (alcoholic liver cirrhosis) ([place])   . Seizures ([place])   . Tobacco abuse   . Hepatic encephalopathy ([place]) 12/02/2018    Past Surgical History:  Procedure Laterality Date  . BREAST SURGERY     BIOPSY  . CATARACT EXTRACTION Right 10/19/2011  . CATARACT EXTRACTION Left 02/22/2012  . COLONOSCOPY    . ESOPHAGOGASTRODUODENOSCOPY    .  EYE SURGERY    . [place] Left 10/30/2014   Procedure: HALLUX VALGUS AUSTIN;  Surgeon: Samara Deist, DPM;  Location: [facility] ORS;  Service: Podiatry;  Laterality: Left;  . LIVER SURGERY     TIPS PROCEDURE  . None    . TIPS PROCEDURE      Prior to Admission medications   Medication Sig Start Date End Date Taking? Authorizing Provider  acetaminophen (TYLENOL) 325 MG tablet Take 650 mg by mouth every 4 (four) hours as needed for mild pain or fever.     [provider]  albuterol (PROVENTIL HFA;VENTOLIN HFA) 108 (90 BASE) MCG/ACT inhaler Inhale 2 puffs into the lungs every 6 (six) hours as needed for wheezing or shortness of breath.     [provider]  feeding supplement, ENSURE ENLIVE, (ENSURE ENLIVE) LIQD Take  237 mLs by mouth 2 (two) times daily between meals. 07/14/19   Ezekiel Slocumb, DO  fluorouracil (EFUDEX) 5 % cream Apply 1 application topically daily. (apply to affected areas on back of hands)    [provider]  fluticasone (FLONASE) 50 MCG/ACT nasal spray Place 1 spray into both nostrils daily as needed for allergies or rhinitis.     [provider]  folic acid (FOLVITE) 1 MG tablet Take 1 mg by mouth daily.    [provider]  furosemide (LASIX) 20 MG tablet Take 20 mg by mouth 2 (two) times daily.    [provider]  ketoconazole (NIZORAL) 2 % cream Apply 1 application topically daily as needed for irritation.     [provider]  lactulose, encephalopathy, (ENULOSE) 10 GM/15ML SOLN Take 45 mLs (30 g total) by mouth in the morning and at bedtime. (may take a third dose if needed) 07/14/19   Ezekiel Slocumb, DO  levETIRAcetam (KEPPRA) 750 MG tablet Take 750 mg by mouth 2 (two) times daily.    [provider]  lidocaine (LIDODERM) 5 % Place 1 patch onto the skin every 12 (twelve) hours. Remove & Discard patch within 12 hours or as directed by MD 10/28/19 10/27/20  Blake Divine, MD  Melatonin 10 MG TABS Take 10 mg by mouth at bedtime.    [provider]  Milk Thistle 500 MG CAPS Take 1 capsule by mouth daily.     [provider]  Multiple Vitamin (MULTIVITAMIN WITH MINERALS) TABS tablet Take 1 tablet by mouth daily. 07/14/19   Ezekiel Slocumb, DO  propranolol (INDERAL) 20 MG tablet Take 20 mg by mouth daily.    [provider]  rifaximin (XIFAXAN) 550 MG TABS tablet Take 550 mg by mouth 2 (two) times daily.    [provider]  sertraline (ZOLOFT) 50 MG tablet Take 50 mg by mouth daily.     [provider]  spironolactone (ALDACTONE) 50 MG tablet Take 50 mg by mouth 2 (two) times daily.    [provider]  tadalafil (CIALIS) 20 MG tablet Take 20 mg by mouth daily as needed for erectile  dysfunction.    [provider]  thiamine 100 MG tablet Take 100 mg by mouth daily.    [provider]  tiZANidine (ZANAFLEX) 2 MG tablet Take 2 mg by mouth every 8 (eight) hours as needed for muscle spasms.     [provider]  traMADol (ULTRAM) 50 MG tablet Take 50 mg by mouth 3 (three) times daily as needed for moderate pain.     [provider]  traZODone (  DESYREL) 100 MG tablet Take 300 mg by mouth at bedtime.     [provider]  vitamin E (VITAMIN E) 180 MG (400 UNITS) capsule Take 400 Units by mouth every evening.    [provider]  zonisamide (ZONEGRAN) 100 MG capsule Take 400 mg by mouth at bedtime.    [provider]    Allergies Poison ivy extract, Poison oak extract, and Poison ivy extract [poison ivy extract]  Family History  Problem Relation Age of Onset  . Hypertension Mother   . Hypertension Father     Social History Social History   Tobacco Use  . Smoking status: Current Every Day Smoker    Packs/day: 0.50    Years: 15.00    Pack years: 7.50    Types: Cigarettes  . Smokeless tobacco: Never Used  Substance Use Topics  . Alcohol use: Yes    Comment: Twice a month.  He used to drink heavily in the past.  . Drug use: Yes    Types: Marijuana    Review of Systems  Constitutional: No fever/chills Eyes: No visual changes. ENT: No sore throat. Cardiovascular: Denies chest pain. Respiratory: Denies shortness of breath. Gastrointestinal: No abdominal pain.  No nausea, no vomiting.  No diarrhea.  No constipation. Genitourinary: Negative for dysuria. Musculoskeletal: Negative for back pain.  Positive for left hip pain. Skin: Negative for rash. Neurological: Negative for headaches, focal weakness or numbness.  Positive for seizure.  ____________________________________________   PHYSICAL EXAM:  VITAL SIGNS: ED Triage Vitals  Enc Vitals Group     BP 10/28/19 1043 (!) 100/55     Pulse Rate  10/28/19 1043 (!) 53     Resp 10/28/19 1043 18     Temp 10/28/19 1043 97.6 F (36.4 C)     Temp Source 10/28/19 1043 Oral     SpO2 10/28/19 1043 97 %     Weight 10/28/19 1043 177 lb (80.3 kg)     Height 10/28/19 1043 5\' 10"  (1.778 m)     Head Circumference --      Peak Flow --      Pain Score 10/28/19 1054 8     Pain Loc --      Pain Edu? --      Excl. in [place]? --     Constitutional: Alert and oriented. Eyes: Conjunctivae are normal. Head: Atraumatic. Nose: No congestion/rhinnorhea. Mouth/Throat: Mucous membranes are moist. Neck: Normal ROM Cardiovascular: Normal rate, regular rhythm. Grossly normal heart sounds. Respiratory: Normal respiratory effort.  No retractions. Lungs CTAB. Gastrointestinal: Soft and nontender. No distention. Genitourinary: deferred Musculoskeletal: No lower extremity tenderness nor edema.  Left hip diffusely tender to palpation, range of motion intact without discomfort. Neurologic:  Normal speech and language. No gross focal neurologic deficits are appreciated. Skin:  Skin is warm, dry and intact. No rash noted. Psychiatric: Mood and affect are normal. Speech and behavior are normal.  ____________________________________________   LABS (all labs ordered are listed, but only abnormal results are displayed)  Labs Reviewed  BASIC METABOLIC PANEL - Abnormal; Notable for the following components:      Result Value   Glucose, Bld 111 (*)    Calcium 8.8 (*)    All other components within normal limits  CBC WITH DIFFERENTIAL/PLATELET - Abnormal; Notable for the following components:   RBC 3.49 (*)    Hemoglobin 12.1 (*)    HCT 35.4 (*)    MCV 101.4 (*)    MCH 34.7 (*)  Platelets 110 (*)    All other components within normal limits  ETHANOL    PROCEDURES  Procedure(s) performed (including Critical Care):  Procedures   ____________________________________________   INITIAL IMPRESSION / ASSESSMENT AND PLAN / ED COURSE       59 year old  male with possible history of hypertension, hyperlipidemia, COPD, GERD, alcohol abuse, seizures, cirrhosis status post TIPS, and hepatocellular carcinoma who presents to the ED following witnessed seizure episode just over 1 week ago where he fell and hit his left hip.  He complains only of pain in his left hip at this time, has no obvious deformity and is neurovascularly intact to his left lower extremity.  X-ray is negative for acute process and lab work is unremarkable.  No findings to suggest significant head or neck trauma, patient is alert and oriented, acting appropriately at this time with no focal deficits.  We will treat his left hip pain with Lidoderm patches and he was counseled to follow-up with his PCP for this.  He was also encouraged to follow-up with his neurologist regarding isolated breakthrough seizure.  He was counseled to avoid alcohol and follow-up with his oncologist regarding hepatocellular carcinoma.  Patient and wife agree with plan.      ____________________________________________   FINAL CLINICAL IMPRESSION(S) / ED DIAGNOSES  Final diagnoses:  Seizure disorder ([place])  Left hip pain     ED Discharge Orders         Ordered    lidocaine (LIDODERM) 5 %  Every 12 hours        10/28/19 1308           Note:  This document was prepared using Dragon voice recognition software and may include unintentional dictation errors.   Blake Divine, MD 10/28/19 1334

## 2019-10-28 NOTE — ED Notes (Signed)
Pt presents to ED with c/o of L hip pain that started 1 week ago due to a fall from a standing position that pt states wife has told him. Pt does not recall this incident and does have a HX of seizures. Pt states he takes seizure medications as RX'ed. Pt states a HX of alcohol abuse but states he hasn't drank in 2 weeks but did also mention "I have snuck some small bottles because I like the taste", pt seems to be unsure of last drink. Pt mentions to this writer "I think I have alzheimer's". Pt is currently A&Ox4. There is no deformity or shortening noted to L hip pain. Pt is currently lying on L hip with no moaning grimmacing noted. Pt states HX of cancer. No distress noted. VSS. No withdrawal symptoms noted at this time.

## 2019-10-28 NOTE — ED Triage Notes (Signed)
Pt to ED via POV for chief complaint of left hip pain from a fall d/t seizures last Saturday.  Complaint with seizure medications.  Has adrenal cancer, states oncologist thinks seizures are from withdrawals, has not drank in 2 weeks.  No obvious deformity noted.

## 2019-11-23 ENCOUNTER — Emergency Department: Payer: Medicare HMO

## 2019-11-23 ENCOUNTER — Emergency Department
Admission: EM | Admit: 2019-11-23 | Discharge: 2019-11-23 | Disposition: A | Discharge status: Home / Self Care | Payer: Medicare HMO | Attending: Emergency Medicine | Admitting: Emergency Medicine

## 2019-11-23 ENCOUNTER — Encounter: Payer: Self-pay | Admitting: Emergency Medicine

## 2019-11-23 ENCOUNTER — Other Ambulatory Visit: Payer: Self-pay

## 2019-11-23 DIAGNOSIS — K7030 Alcoholic cirrhosis of liver without ascites: Secondary | ICD-10-CM | POA: Diagnosis not present

## 2019-11-23 DIAGNOSIS — F17210 Nicotine dependence, cigarettes, uncomplicated: Secondary | ICD-10-CM | POA: Insufficient documentation

## 2019-11-23 DIAGNOSIS — J449 Chronic obstructive pulmonary disease, unspecified: Secondary | ICD-10-CM | POA: Insufficient documentation

## 2019-11-23 DIAGNOSIS — M79622 Pain in left upper arm: Secondary | ICD-10-CM | POA: Diagnosis present

## 2019-11-23 DIAGNOSIS — R079 Chest pain, unspecified: Secondary | ICD-10-CM | POA: Insufficient documentation

## 2019-11-23 DIAGNOSIS — I10 Essential (primary) hypertension: Secondary | ICD-10-CM | POA: Diagnosis not present

## 2019-11-23 DIAGNOSIS — Z79899 Other long term (current) drug therapy: Secondary | ICD-10-CM | POA: Diagnosis not present

## 2019-11-23 LAB — COMPREHENSIVE METABOLIC PANEL
ALT: 63 U/L — ABNORMAL HIGH (ref 0–44)
AST: 80 U/L — ABNORMAL HIGH (ref 15–41)
Albumin: 3 g/dL — ABNORMAL LOW (ref 3.5–5.0)
Alkaline Phosphatase: 135 U/L — ABNORMAL HIGH (ref 38–126)
Anion gap: 7 (ref 5–15)
BUN: 11 mg/dL (ref 6–20)
CO2: 26 mmol/L (ref 22–32)
Calcium: 8.3 mg/dL — ABNORMAL LOW (ref 8.9–10.3)
Chloride: 99 mmol/L (ref 98–111)
Creatinine, Ser: 0.68 mg/dL (ref 0.61–1.24)
GFR, Estimated: >60 mL/min (ref >60)
Glucose, Bld: 128 mg/dL — ABNORMAL HIGH (ref 70–99)
Potassium: 3.3 mmol/L — ABNORMAL LOW (ref 3.5–5.1)
Sodium: 132 mmol/L — ABNORMAL LOW (ref 135–145)
Total Bilirubin: 3.1 mg/dL — ABNORMAL HIGH (ref 0.3–1.2)
Total Protein: 6.3 g/dL — ABNORMAL LOW (ref 6.5–8.1)

## 2019-11-23 LAB — AMMONIA: Ammonia: 50 umol/L — ABNORMAL HIGH (ref 9–35)

## 2019-11-23 LAB — CBC WITH DIFFERENTIAL/PLATELET
Abs Immature Granulocytes: 0.04 10*3/uL (ref 0.00–0.07)
Basophils Absolute: 0 10*3/uL (ref 0.0–0.1)
Basophils Relative: 1 %
Eosinophils Absolute: 0.2 10*3/uL (ref 0.0–0.5)
Eosinophils Relative: 3 %
HCT: 33.3 % — ABNORMAL LOW (ref 39.0–52.0)
Hemoglobin: 11.8 g/dL — ABNORMAL LOW (ref 13.0–17.0)
Immature Granulocytes: 1 %
Lymphocytes Relative: 19 %
Lymphs Abs: 1 10*3/uL (ref 0.7–4.0)
MCH: 36.8 pg — ABNORMAL HIGH (ref 26.0–34.0)
MCHC: 35.4 g/dL (ref 30.0–36.0)
MCV: 103.7 fL — ABNORMAL HIGH (ref 80.0–100.0)
Monocytes Absolute: 1 10*3/uL (ref 0.1–1.0)
Monocytes Relative: 19 %
Neutro Abs: 3.1 10*3/uL (ref 1.7–7.7)
Neutrophils Relative %: 57 %
Platelets: 112 10*3/uL — ABNORMAL LOW (ref 150–400)
RBC: 3.21 MIL/uL — ABNORMAL LOW (ref 4.22–5.81)
RDW: 15.6 % — ABNORMAL HIGH (ref 11.5–15.5)
WBC: 5.3 10*3/uL (ref 4.0–10.5)
nRBC: 0 % (ref 0.0–0.2)

## 2019-11-23 LAB — SAMPLE TO BLOOD BANK

## 2019-11-23 LAB — ETHANOL: Alcohol, Ethyl (B): <10 mg/dL (ref <10)

## 2019-11-23 LAB — TROPONIN I (HIGH SENSITIVITY): Troponin I (High Sensitivity): 13 ng/L (ref <18)

## 2019-11-23 LAB — FIBRIN DERIVATIVES D-DIMER (ARMC ONLY): Fibrin derivatives D-dimer (ARMC): 1240.82 ng/mL (FEU) — ABNORMAL HIGH (ref 0.00–499.00)

## 2019-11-23 MED ORDER — KETOROLAC TROMETHAMINE 30 MG/ML IJ SOLN
15.0000 mg | INTRAMUSCULAR | Status: AC
  Administered 2019-11-23: 15 mg via INTRAVENOUS
  Filled 2019-11-23: qty 1

## 2019-11-23 MED ORDER — IOHEXOL 350 MG/ML SOLN
100.0000 mL | Freq: Once | INTRAVENOUS | Status: AC | PRN
  Administered 2019-11-23: 100 mL via INTRAVENOUS

## 2019-11-23 MED ORDER — ACETAMINOPHEN 500 MG PO TABS
1000.0000 mg | ORAL_TABLET | Freq: Once | ORAL | Status: AC
  Administered 2019-11-23: 1000 mg via ORAL
  Filled 2019-11-23: qty 2

## 2019-11-23 MED ORDER — METHOCARBAMOL 500 MG PO TABS
500.0000 mg | ORAL_TABLET | Freq: Once | ORAL | Status: AC
  Administered 2019-11-23: 500 mg via ORAL
  Filled 2019-11-23: qty 1

## 2019-11-23 NOTE — ED Provider Notes (Signed)
[facility] Emergency Department Provider Note  ____________________________________________  Time seen: Approximately 6:42 AM  I have reviewed the triage vital signs and the nursing notes.   HISTORY  Chief Complaint Shoulder Injury    HPI Adrian Hale is a 59 y.o. male with a history of alcohol abuse, cirrhosis status post TIPS in 2007, COPD, hypertension  who comes the ED complaining of left axillary pain radiating down the left arm for the past 2 weeks, constant, waxing and waning, no aggravating or alleviating factors.  Denies any new injuries.  Reports being compliant with his medications including lactulose.  Onset of pain was after falling while intoxicated 2 weeks ago. Denies pleuritic chest pain  Past Medical History:  Diagnosis Date  . ALC (alcoholic liver cirrhosis) ([place])   . Alcohol abuse    NONE SINCE 08/28/2011 AND ATTENDING AA MEETINGS  . Alcoholic cirrhosis of liver ([place])   . Alcoholic peripheral neuropathy ([place])   . Allergy to poison ivy 06/06/2013   RASH  . Anxiety   . Aortic aneurysm ([place])   . Cellulitis of both lower extremities   . Complex partial seizure evolving to generalized seizure ([place])   . COPD (chronic obstructive pulmonary disease) ([place])   . Depression    MAJOR DEPRESSIVE DISORDER  . ED (erectile dysfunction)   . Hepatic encephalopathy ([place])   . Hyperlipidemia   . Hypertension    ESSENTIAL  . Maculopathy   . Marijuana abuse   . MRSA (methicillin resistant staph aureus) culture positive   . Obesity   . Portal hypertension ([place])   . Pseudophakia of both eyes   . Ptosis of both eyelids   . S/P TIPS (transjugular intrahepatic portosystemic shunt)   . Seizures ([place])    Last seizure April 06, 2013, [facility]  . Seizures [facility])   . Sleep apnea    Does not use CPAP  . Thrombocytopenia, secondary   . Tobacco abuse   . Toxic maculopathy of right eye   . Ulcer of lower limb ([place])    ABSCESS OR CELLULITIS  OF LEG     Patient Active Problem List   Diagnosis Date Noted  . Chest wall pain 07/11/2019  . Fall at home, initial encounter 07/11/2019  . COPD (chronic obstructive pulmonary disease) ([place])   . GERD (gastroesophageal reflux disease)   . Depression   . Alcohol abuse   . Confusion   . ALC (alcoholic liver cirrhosis) ([place])   . Seizures ([place])   . Tobacco abuse   . Hepatic encephalopathy ([place]) 12/02/2018     Past Surgical History:  Procedure Laterality Date  . BREAST SURGERY     BIOPSY  . CATARACT EXTRACTION Right 10/19/2011  . CATARACT EXTRACTION Left 02/22/2012  . COLONOSCOPY    . ESOPHAGOGASTRODUODENOSCOPY    . EYE SURGERY    . [place] Left 10/30/2014   Procedure: HALLUX VALGUS AUSTIN;  Surgeon: Samara Deist, DPM;  Location: [facility] ORS;  Service: Podiatry;  Laterality: Left;  . LIVER SURGERY     TIPS PROCEDURE  . None    . TIPS PROCEDURE       Prior to Admission medications   Medication Sig Start Date End Date Taking? Authorizing Provider  acetaminophen (TYLENOL) 325 MG tablet Take 650 mg by mouth every 4 (four) hours as needed for mild pain or fever.     [provider]  albuterol (PROVENTIL HFA;VENTOLIN HFA) 108 (90 BASE) MCG/ACT inhaler Inhale 2 puffs into  the lungs every 6 (six) hours as needed for wheezing or shortness of breath.     [provider]  feeding supplement, ENSURE ENLIVE, (ENSURE ENLIVE) LIQD Take 237 mLs by mouth 2 (two) times daily between meals. 07/14/19   Ezekiel Slocumb, DO  fluorouracil (EFUDEX) 5 % cream Apply 1 application topically daily. (apply to affected areas on back of hands)    [provider]  fluticasone (FLONASE) 50 MCG/ACT nasal spray Place 1 spray into both nostrils daily as needed for allergies or rhinitis.     [provider]  folic acid (FOLVITE) 1 MG tablet Take 1 mg by mouth daily.    [provider]  furosemide (LASIX) 20 MG tablet Take 20 mg by mouth 2 (two) times daily.     [provider]  ketoconazole (NIZORAL) 2 % cream Apply 1 application topically daily as needed for irritation.     [provider]  lactulose, encephalopathy, (ENULOSE) 10 GM/15ML SOLN Take 45 mLs (30 g total) by mouth in the morning and at bedtime. (may take a third dose if needed) 07/14/19   Ezekiel Slocumb, DO  levETIRAcetam (KEPPRA) 750 MG tablet Take 750 mg by mouth 2 (two) times daily.    [provider]  lidocaine (LIDODERM) 5 % Place 1 patch onto the skin every 12 (twelve) hours. Remove & Discard patch within 12 hours or as directed by MD 10/28/19 10/27/20  Blake Divine, MD  Melatonin 10 MG TABS Take 10 mg by mouth at bedtime.    [provider]  Milk Thistle 500 MG CAPS Take 1 capsule by mouth daily.     [provider]  Multiple Vitamin (MULTIVITAMIN WITH MINERALS) TABS tablet Take 1 tablet by mouth daily. 07/14/19   Ezekiel Slocumb, DO  propranolol (INDERAL) 20 MG tablet Take 20 mg by mouth daily.    [provider]  rifaximin (XIFAXAN) 550 MG TABS tablet Take 550 mg by mouth 2 (two) times daily.    [provider]  sertraline (ZOLOFT) 50 MG tablet Take 50 mg by mouth daily.     [provider]  spironolactone (ALDACTONE) 50 MG tablet Take 50 mg by mouth 2 (two) times daily.    [provider]  tadalafil (CIALIS) 20 MG tablet Take 20 mg by mouth daily as needed for erectile dysfunction.    [provider]  thiamine 100 MG tablet Take 100 mg by mouth daily.    [provider]  tiZANidine (ZANAFLEX) 2 MG tablet Take 2 mg by mouth every 8 (eight) hours as needed for muscle spasms.     [provider]  traMADol (ULTRAM) 50 MG tablet Take 50 mg by mouth 3 (three) times daily as needed for moderate pain.     [provider]  traZODone (DESYREL) 100 MG tablet Take 300 mg by mouth at bedtime.     [provider]  vitamin E (VITAMIN E) 180 MG (400 UNITS) capsule Take  400 Units by mouth every evening.    [provider]  zonisamide (ZONEGRAN) 100 MG capsule Take 400 mg by mouth at bedtime.    [provider]     Allergies Poison ivy extract, Poison oak extract, and Poison ivy extract [poison ivy extract]   Family History  Problem Relation Age of Onset  . Hypertension Mother   . Hypertension Father     Social History Social History   Tobacco Use  . Smoking status: Current  Every Day Smoker    Packs/day: 0.50    Years: 15.00    Pack years: 7.50    Types: Cigarettes  . Smokeless tobacco: Never Used  Substance Use Topics  . Alcohol use: Yes    Comment: Twice a month.  He used to drink heavily in the past.  . Drug use: Yes    Types: Marijuana    Review of Systems  Constitutional:   No fever or chills.  ENT:   No sore throat. No rhinorrhea. Cardiovascular:   No chest pain or syncope. Respiratory:   No dyspnea or cough. Gastrointestinal:   Negative for abdominal pain, vomiting and diarrhea.  Musculoskeletal:   Positive left shoulder and arm pain. All other systems reviewed and are negative except as documented above in ROS and HPI.  ____________________________________________   PHYSICAL EXAM:  VITAL SIGNS: ED Triage Vitals  Enc Vitals Group     BP 11/23/19 0556 134/76     Pulse Rate 11/23/19 0556 60     Resp 11/23/19 0556 18     Temp 11/23/19 0556 98.6 F (37 C)     Temp Source 11/23/19 0556 Oral     SpO2 11/23/19 0556 99 %     Weight 11/23/19 0557 135 lb (61.2 kg)     Height 11/23/19 0557 5\' 11"  (1.803 m)     Head Circumference --      Peak Flow --      Pain Score 11/23/19 0557 8     Pain Loc --      Pain Edu? --      Excl. in [place]? --     Vital signs reviewed, nursing assessments reviewed.   Constitutional:   Alert and oriented. Non-toxic appearance. Eyes:   Conjunctivae are normal. EOMI. PERRL. ENT      Head:   Normocephalic and atraumatic.      Nose:   Wearing a mask.      Mouth/Throat:    Wearing a mask.      Neck:   No meningismus. Full ROM. Hematological/Lymphatic/Immunilogical:   No cervical lymphadenopathy. Cardiovascular:   RRR. Symmetric bilateral radial and DP pulses.  No murmurs. Cap refill less than 2 seconds. Respiratory:   Normal respiratory effort without tachypnea/retractions. Breath sounds are clear and equal bilaterally. No wheezes/rales/rhonchi. Gastrointestinal:   Soft and nontender. Non distended. There is no CVA tenderness.  No rebound, rigidity, or guarding.  Musculoskeletal:   Normal range of motion in all extremities. No joint effusions.  No lower extremity tenderness.  No edema.  Chest wall stable and nontender Neurologic:   Normal speech and language.  Motor grossly intact. No acute focal neurologic deficits are appreciated.  Skin:    Skin is warm, dry and intact.  No ecchymosis.  No rash noted.  No petechiae, purpura, or bullae.  ____________________________________________    LABS (pertinent positives/negatives) (all labs ordered are listed, but only abnormal results are displayed) Labs Reviewed  COMPREHENSIVE METABOLIC PANEL - Abnormal; Notable for the following components:      Result Value   Sodium 132 (*)    Potassium 3.3 (*)    Glucose, Bld 128 (*)    Calcium 8.3 (*)    Total Protein 6.3 (*)    Albumin 3.0 (*)    AST 80 (*)    ALT 63 (*)    Alkaline Phosphatase 135 (*)    Total Bilirubin 3.1 (*)    All other components within normal limits  CBC WITH DIFFERENTIAL/PLATELET -  Abnormal; Notable for the following components:   RBC 3.21 (*)    Hemoglobin 11.8 (*)    HCT 33.3 (*)    MCV 103.7 (*)    MCH 36.8 (*)    RDW 15.6 (*)    Platelets 112 (*)    All other components within normal limits  AMMONIA - Abnormal; Notable for the following components:   Ammonia 50 (*)    All other components within normal limits  FIBRIN DERIVATIVES D-DIMER ([facility] ONLY) - Abnormal; Notable for the following components:   Fibrin derivatives D-dimer  ([facility]) 1,240.82 (*)    All other components within normal limits  ETHANOL  TROPONIN I (HIGH SENSITIVITY)   ____________________________________________   EKG  Interpreted by me Sinus rhythm rate of 61, normal axis and intervals.  Normal QRS and ST segments.  Isolated T wave inversion in lead V3 which is nonspecific.  ____________________________________________    RADIOLOGY  No results found.  ____________________________________________   PROCEDURES Procedures  ____________________________________________  DIFFERENTIAL DIAGNOSIS   Rotator cuff injury, arthritis, chest wall contusion, aortic dissection  CLINICAL IMPRESSION / ASSESSMENT AND PLAN / ED COURSE  Medications ordered in the ED: Medications  iohexol (OMNIPAQUE) 350 MG/ML injection 100 mL (has no administration in time range)    Pertinent labs & imaging results that were available during my care of the patient were reviewed by me and considered in my medical decision making (see chart for details).  MADDYX WIECK was evaluated in Emergency Department on 11/23/2019 for the symptoms described in the history of present illness. He was evaluated in the context of the global COVID-19 pandemic, which necessitated consideration that the patient might be at risk for infection with the SARS-CoV-2 virus that causes COVID-19. Institutional protocols and algorithms that pertain to the evaluation of patients at risk for COVID-19 are in a state of rapid change based on information released by regulatory bodies including the CDC and federal and state organizations. These policies and algorithms were followed during the patient's care in the ED.   Patient presents with subacute left arm pain. Considering the patient's symptoms, medical history, and physical examination today, I have low suspicion for ACS, PE, TAD, pneumothorax, carditis, mediastinitis, pneumonia, CHF, or sepsis.  Vital signs are normal, exam is reassuring.   However, with patient's history of cirrhosis and TIPS, and my sense that his ability to provide a history may not be thorough or entirely accurate, will obtain a broad work-up with chest x-ray, labs including serial troponin and D-dimer.  If D-dimer is positive, will plan to obtain CT angiogram chest to evaluate for aortic dissection which I overall think is unlikely due to normal vital signs and somewhat atypical symptoms.  Doubt PE given his underlying liver disease, alcohol abuse, lack of pleuritic pain, and normal vitals.   ----------------------------------------- 7:09 AM on 11/23/2019 -----------------------------------------  D-dimer markedly elevated at 1200.  Other labs unremarkable.  Will obtain CT angiogram to evaluate for dissection. .     ____________________________________________   FINAL CLINICAL IMPRESSION(S) / ED DIAGNOSES    Final diagnoses:  Left-sided chest pain  Alcoholic cirrhosis, unspecified whether ascites present ([place])  Chronic obstructive pulmonary disease, unspecified COPD type [facility])     ED Discharge Orders    None      Portions of this note were generated with dragon dictation software. Dictation errors may occur despite best attempts at proofreading.   Carrie Mew, MD 11/23/19 [phone]

## 2019-11-23 NOTE — Discharge Instructions (Signed)
Please take Tylenol and ibuprofen/Advil for your pain.  It is safe to take them together, or to alternate them every few hours.  Take up to 1000mg  of Tylenol at a time, up to 4 times per day.  Do not take more than 4000 mg of Tylenol in 24 hours.  For ibuprofen, take 400-600 mg, 4-5 times per day.  As we discussed, do not drink alcohol and take large doses of ibuprofen/Advil at the same time.  This can tear up your stomach and cause bleeding.  I would strongly urge you to follow-up with your [facility] oncologist to discuss your adrenal cancer and chemotherapy.  I have also included the phone number for one of our local oncologists.  If you are at a point we would like some help with your drinking, please get in touch with [facility].  I have attached their phone number.  Or you can always return to the ED.  Return to the ED with any fevers with your pain or inability to use your left arm or hand.

## 2019-11-23 NOTE — ED Provider Notes (Addendum)
Patient received in signout from Dr. Joni Fears for evaluation of subacute left shoulder pain.  CTA CAP reviewed and without evidence of PE or aortic dissection.  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  While he may have some pleural irritation from his mets causing his pain, he also has evidence of a partial rotator cuff injury on my examination.  Patient reports knowing about his left adrenal mass and follows with a [facility] oncologist.  He was scheduled to have chemotherapy 2 weeks ago, but he reports he got drunk and stayed home instead, and this was why he fell then.  I offered him a sling, but he declines.  We discussed rotator cuff PT and I provided him with a printout of exercises. He declines any help for his alcoholism, indicating that he is not ready.  We discussed following up with his [facility] oncologist and I urged cessation of drinking.  We discussed return precautions for the ED.  Patient medically stable for discharge home.   Adrian Crofts, MD 11/23/19 [id number]    Adrian Crofts, MD 11/23/19 [phone]

## 2019-11-23 NOTE — ED Triage Notes (Signed)
Pt to triage via w/c with no distress noted, skin pale; st "got drunk" 2wks ago and fell injuring left shoulder; c/o persistent pain and unable to get comfortable; pt at times slow to answer questions; pt then st he has been having CP and left arm pain; when asked to elaborate further pt then denies ever having CP; during triage, pt suddenly grimaces and points to left lateral upper ribcage and st that is where his pain is

## 2019-12-13 ENCOUNTER — Emergency Department: Payer: Medicare HMO

## 2019-12-13 ENCOUNTER — Inpatient Hospital Stay
Admission: EM | Admit: 2019-12-13 | Discharge: 2020-01-12 | DRG: 432 | Disposition: E | Payer: Medicare HMO | Attending: Internal Medicine | Admitting: Internal Medicine

## 2019-12-13 DIAGNOSIS — J9602 Acute respiratory failure with hypercapnia: Secondary | ICD-10-CM | POA: Diagnosis not present

## 2019-12-13 DIAGNOSIS — E785 Hyperlipidemia, unspecified: Secondary | ICD-10-CM | POA: Diagnosis present

## 2019-12-13 DIAGNOSIS — G40909 Epilepsy, unspecified, not intractable, without status epilepticus: Secondary | ICD-10-CM

## 2019-12-13 DIAGNOSIS — K7030 Alcoholic cirrhosis of liver without ascites: Secondary | ICD-10-CM | POA: Diagnosis present

## 2019-12-13 DIAGNOSIS — F1020 Alcohol dependence, uncomplicated: Secondary | ICD-10-CM

## 2019-12-13 DIAGNOSIS — G9341 Metabolic encephalopathy: Secondary | ICD-10-CM | POA: Diagnosis present

## 2019-12-13 DIAGNOSIS — K766 Portal hypertension: Secondary | ICD-10-CM | POA: Diagnosis present

## 2019-12-13 DIAGNOSIS — K7290 Hepatic failure, unspecified without coma: Secondary | ICD-10-CM | POA: Diagnosis present

## 2019-12-13 DIAGNOSIS — I4510 Unspecified right bundle-branch block: Secondary | ICD-10-CM | POA: Diagnosis present

## 2019-12-13 DIAGNOSIS — Z66 Do not resuscitate: Secondary | ICD-10-CM | POA: Diagnosis not present

## 2019-12-13 DIAGNOSIS — F1910 Other psychoactive substance abuse, uncomplicated: Secondary | ICD-10-CM | POA: Diagnosis not present

## 2019-12-13 DIAGNOSIS — F1210 Cannabis abuse, uncomplicated: Secondary | ICD-10-CM | POA: Diagnosis present

## 2019-12-13 DIAGNOSIS — K7040 Alcoholic hepatic failure without coma: Principal | ICD-10-CM | POA: Diagnosis present

## 2019-12-13 DIAGNOSIS — K922 Gastrointestinal hemorrhage, unspecified: Secondary | ICD-10-CM | POA: Diagnosis present

## 2019-12-13 DIAGNOSIS — Z515 Encounter for palliative care: Secondary | ICD-10-CM

## 2019-12-13 DIAGNOSIS — J9601 Acute respiratory failure with hypoxia: Secondary | ICD-10-CM | POA: Diagnosis not present

## 2019-12-13 DIAGNOSIS — J449 Chronic obstructive pulmonary disease, unspecified: Secondary | ICD-10-CM | POA: Diagnosis present

## 2019-12-13 DIAGNOSIS — I10 Essential (primary) hypertension: Secondary | ICD-10-CM | POA: Diagnosis present

## 2019-12-13 DIAGNOSIS — Z681 Body mass index (BMI) 19 or less, adult: Secondary | ICD-10-CM | POA: Diagnosis not present

## 2019-12-13 DIAGNOSIS — Z01818 Encounter for other preprocedural examination: Secondary | ICD-10-CM | POA: Diagnosis not present

## 2019-12-13 DIAGNOSIS — Z20822 Contact with and (suspected) exposure to covid-19: Secondary | ICD-10-CM | POA: Diagnosis present

## 2019-12-13 DIAGNOSIS — Z8249 Family history of ischemic heart disease and other diseases of the circulatory system: Secondary | ICD-10-CM

## 2019-12-13 DIAGNOSIS — E440 Moderate protein-calorie malnutrition: Secondary | ICD-10-CM | POA: Diagnosis present

## 2019-12-13 DIAGNOSIS — K219 Gastro-esophageal reflux disease without esophagitis: Secondary | ICD-10-CM | POA: Diagnosis present

## 2019-12-13 DIAGNOSIS — G4089 Other seizures: Secondary | ICD-10-CM | POA: Diagnosis present

## 2019-12-13 DIAGNOSIS — F329 Major depressive disorder, single episode, unspecified: Secondary | ICD-10-CM | POA: Diagnosis present

## 2019-12-13 DIAGNOSIS — C7972 Secondary malignant neoplasm of left adrenal gland: Secondary | ICD-10-CM | POA: Diagnosis present

## 2019-12-13 DIAGNOSIS — D72819 Decreased white blood cell count, unspecified: Secondary | ICD-10-CM | POA: Diagnosis present

## 2019-12-13 DIAGNOSIS — Z91138 Patient's unintentional underdosing of medication regimen for other reason: Secondary | ICD-10-CM

## 2019-12-13 DIAGNOSIS — C220 Liver cell carcinoma: Secondary | ICD-10-CM | POA: Diagnosis present

## 2019-12-13 DIAGNOSIS — G40901 Epilepsy, unspecified, not intractable, with status epilepticus: Secondary | ICD-10-CM | POA: Diagnosis not present

## 2019-12-13 DIAGNOSIS — D6959 Other secondary thrombocytopenia: Secondary | ICD-10-CM | POA: Diagnosis present

## 2019-12-13 DIAGNOSIS — F10239 Alcohol dependence with withdrawal, unspecified: Secondary | ICD-10-CM | POA: Diagnosis not present

## 2019-12-13 DIAGNOSIS — D649 Anemia, unspecified: Secondary | ICD-10-CM | POA: Diagnosis present

## 2019-12-13 DIAGNOSIS — K7682 Hepatic encephalopathy: Secondary | ICD-10-CM

## 2019-12-13 DIAGNOSIS — G40201 Localization-related (focal) (partial) symptomatic epilepsy and epileptic syndromes with complex partial seizures, not intractable, with status epilepticus: Secondary | ICD-10-CM | POA: Diagnosis present

## 2019-12-13 DIAGNOSIS — F17210 Nicotine dependence, cigarettes, uncomplicated: Secondary | ICD-10-CM | POA: Diagnosis present

## 2019-12-13 DIAGNOSIS — F32A Depression, unspecified: Secondary | ICD-10-CM | POA: Diagnosis present

## 2019-12-13 DIAGNOSIS — Z978 Presence of other specified devices: Secondary | ICD-10-CM

## 2019-12-13 DIAGNOSIS — G319 Degenerative disease of nervous system, unspecified: Secondary | ICD-10-CM | POA: Diagnosis present

## 2019-12-13 DIAGNOSIS — Z79899 Other long term (current) drug therapy: Secondary | ICD-10-CM | POA: Diagnosis not present

## 2019-12-13 DIAGNOSIS — Z0189 Encounter for other specified special examinations: Secondary | ICD-10-CM

## 2019-12-13 DIAGNOSIS — T473X6A Underdosing of saline and osmotic laxatives, initial encounter: Secondary | ICD-10-CM | POA: Diagnosis present

## 2019-12-13 HISTORY — DX: Liver cell carcinoma: C22.0

## 2019-12-13 LAB — CBC WITH DIFFERENTIAL/PLATELET
Abs Immature Granulocytes: 0.02 10*3/uL (ref 0.00–0.07)
Basophils Absolute: 0 10*3/uL (ref 0.0–0.1)
Basophils Relative: 1 %
Eosinophils Absolute: 0.2 10*3/uL (ref 0.0–0.5)
Eosinophils Relative: 5 %
HCT: 26.9 % — ABNORMAL LOW (ref 39.0–52.0)
Hemoglobin: 9.8 g/dL — ABNORMAL LOW (ref 13.0–17.0)
Immature Granulocytes: 1 %
Lymphocytes Relative: 21 %
Lymphs Abs: 0.8 10*3/uL (ref 0.7–4.0)
MCH: 37.1 pg — ABNORMAL HIGH (ref 26.0–34.0)
MCHC: 36.4 g/dL — ABNORMAL HIGH (ref 30.0–36.0)
MCV: 101.9 fL — ABNORMAL HIGH (ref 80.0–100.0)
Monocytes Absolute: 0.5 10*3/uL (ref 0.1–1.0)
Monocytes Relative: 13 %
Neutro Abs: 2.3 10*3/uL (ref 1.7–7.7)
Neutrophils Relative %: 59 %
Platelets: 98 10*3/uL — ABNORMAL LOW (ref 150–400)
RBC: 2.64 MIL/uL — ABNORMAL LOW (ref 4.22–5.81)
RDW: 14.8 % (ref 11.5–15.5)
WBC: 3.8 10*3/uL — ABNORMAL LOW (ref 4.0–10.5)
nRBC: 0 % (ref 0.0–0.2)

## 2019-12-13 LAB — COMPREHENSIVE METABOLIC PANEL
ALT: 29 U/L (ref 0–44)
AST: 40 U/L (ref 15–41)
Albumin: 2.6 g/dL — ABNORMAL LOW (ref 3.5–5.0)
Alkaline Phosphatase: 151 U/L — ABNORMAL HIGH (ref 38–126)
Anion gap: 7 (ref 5–15)
BUN: 10 mg/dL (ref 6–20)
CO2: 25 mmol/L (ref 22–32)
Calcium: 8.3 mg/dL — ABNORMAL LOW (ref 8.9–10.3)
Chloride: 105 mmol/L (ref 98–111)
Creatinine, Ser: 0.67 mg/dL (ref 0.61–1.24)
GFR, Estimated: >60 mL/min (ref >60)
Glucose, Bld: 97 mg/dL (ref 70–99)
Potassium: 3.5 mmol/L (ref 3.5–5.1)
Sodium: 137 mmol/L (ref 135–145)
Total Bilirubin: 1.7 mg/dL — ABNORMAL HIGH (ref 0.3–1.2)
Total Protein: 5.1 g/dL — ABNORMAL LOW (ref 6.5–8.1)

## 2019-12-13 LAB — RESP PANEL BY RT-PCR (FLU A&B, COVID) ARPGX2
Influenza A by PCR: NEGATIVE
Influenza B by PCR: NEGATIVE
SARS Coronavirus 2 by RT PCR: NEGATIVE

## 2019-12-13 LAB — URINALYSIS, COMPLETE (UACMP) WITH MICROSCOPIC
Bilirubin Urine: NEGATIVE
Glucose, UA: NEGATIVE mg/dL
Hgb urine dipstick: NEGATIVE
Ketones, ur: NEGATIVE mg/dL
Leukocytes,Ua: NEGATIVE
Nitrite: NEGATIVE
Protein, ur: NEGATIVE mg/dL
Specific Gravity, Urine: 1.013 (ref 1.005–1.030)
pH: 5 (ref 5.0–8.0)

## 2019-12-13 LAB — URINE DRUG SCREEN, QUALITATIVE (ARMC ONLY)
Amphetamines, Ur Screen: NOT DETECTED
Barbiturates, Ur Screen: NOT DETECTED
Benzodiazepine, Ur Scrn: POSITIVE — AB
Cannabinoid 50 Ng, Ur ~~LOC~~: POSITIVE — AB
Cocaine Metabolite,Ur ~~LOC~~: NOT DETECTED
MDMA (Ecstasy)Ur Screen: NOT DETECTED
Methadone Scn, Ur: NOT DETECTED
Opiate, Ur Screen: NOT DETECTED
Phencyclidine (PCP) Ur S: NOT DETECTED
Tricyclic, Ur Screen: POSITIVE — AB

## 2019-12-13 LAB — AMMONIA: Ammonia: 108 umol/L — ABNORMAL HIGH (ref 9–35)

## 2019-12-13 LAB — ETHANOL: Alcohol, Ethyl (B): <10 mg/dL (ref <10)

## 2019-12-13 MED ORDER — ACETAMINOPHEN 325 MG PO TABS: 650.0000 mg | ORAL_TABLET | ORAL | Status: DC | PRN

## 2019-12-13 MED ORDER — ONDANSETRON HCL 4 MG/2ML IJ SOLN: 4.0000 mg | Freq: Four times a day (QID) | INTRAMUSCULAR | Status: DC | PRN

## 2019-12-13 MED ORDER — MILK THISTLE 500 MG PO CAPS: 1.0000 | ORAL_CAPSULE | Freq: Every day | ORAL | Status: DC

## 2019-12-13 MED ORDER — FUROSEMIDE 40 MG PO TABS: 20.0000 mg | ORAL_TABLET | Freq: Two times a day (BID) | ORAL | Status: DC

## 2019-12-13 MED ORDER — SERTRALINE HCL 50 MG PO TABS: 50.0000 mg | ORAL_TABLET | Freq: Every day | ORAL | Status: DC

## 2019-12-13 MED ORDER — ACETAMINOPHEN 650 MG RE SUPP: 650.0000 mg | RECTAL | Status: DC | PRN

## 2019-12-13 MED ORDER — TRAMADOL HCL 50 MG PO TABS: 50.0000 mg | ORAL_TABLET | Freq: Three times a day (TID) | ORAL | Status: DC | PRN

## 2019-12-13 MED ORDER — MAGNESIUM HYDROXIDE 400 MG/5ML PO SUSP: 30.0000 mL | Freq: Every day | ORAL | Status: DC | PRN

## 2019-12-13 MED ORDER — SPIRONOLACTONE 25 MG PO TABS
50.0000 mg | ORAL_TABLET | Freq: Two times a day (BID) | ORAL | Status: DC
  Filled 2019-12-13 (×3): qty 2

## 2019-12-13 MED ORDER — SODIUM CHLORIDE 0.9 % IV SOLN
75.0000 mL/h | INTRAVENOUS | Status: DC
  Administered 2019-12-14 – 2019-12-15 (×2): 75 mL/h via INTRAVENOUS

## 2019-12-13 MED ORDER — FOLIC ACID 1 MG PO TABS: 1.0000 mg | ORAL_TABLET | Freq: Every day | ORAL | Status: DC

## 2019-12-13 MED ORDER — FLUOROURACIL 5 % EX CREA: 1.0000 "application " | TOPICAL_CREAM | Freq: Every day | CUTANEOUS | Status: DC

## 2019-12-13 MED ORDER — LORAZEPAM 2 MG/ML IJ SOLN
1.0000 mg | INTRAMUSCULAR | Status: DC | PRN
  Administered 2019-12-14: 1 mg via INTRAVENOUS
  Filled 2019-12-13: qty 1

## 2019-12-13 MED ORDER — ALBUTEROL SULFATE (2.5 MG/3ML) 0.083% IN NEBU: 2.5000 mg | INHALATION_SOLUTION | Freq: Four times a day (QID) | RESPIRATORY_TRACT | Status: DC | PRN

## 2019-12-13 MED ORDER — ZONISAMIDE 100 MG PO CAPS: 400.0000 mg | ORAL_CAPSULE | Freq: Every day | ORAL | Status: DC

## 2019-12-13 MED ORDER — ENSURE ENLIVE PO LIQD: 237.0000 mL | Freq: Two times a day (BID) | ORAL | Status: DC

## 2019-12-13 MED ORDER — ADULT MULTIVITAMIN W/MINERALS CH: 1.0000 | ORAL_TABLET | Freq: Every day | ORAL | Status: DC

## 2019-12-13 MED ORDER — THIAMINE HCL 100 MG PO TABS: 100.0000 mg | ORAL_TABLET | Freq: Every day | ORAL | Status: DC

## 2019-12-13 MED ORDER — LIDOCAINE 5 % EX PTCH
1.0000 | MEDICATED_PATCH | Freq: Two times a day (BID) | CUTANEOUS | Status: DC
  Filled 2019-12-13 (×3): qty 1

## 2019-12-13 MED ORDER — TIZANIDINE HCL 2 MG PO TABS
2.0000 mg | ORAL_TABLET | Freq: Three times a day (TID) | ORAL | Status: DC | PRN
  Filled 2019-12-13: qty 1

## 2019-12-13 MED ORDER — ONDANSETRON HCL 4 MG PO TABS: 4.0000 mg | ORAL_TABLET | Freq: Four times a day (QID) | ORAL | Status: DC | PRN

## 2019-12-13 MED ORDER — MELATONIN 5 MG PO TABS
10.0000 mg | ORAL_TABLET | Freq: Every day | ORAL | Status: DC
  Filled 2019-12-13: qty 2

## 2019-12-13 MED ORDER — RIFAXIMIN 550 MG PO TABS
550.0000 mg | ORAL_TABLET | Freq: Two times a day (BID) | ORAL | Status: DC
  Filled 2019-12-13 (×4): qty 1

## 2019-12-13 MED ORDER — VITAMIN E 45 MG (100 UNIT) PO CAPS
400.0000 [IU] | ORAL_CAPSULE | Freq: Every evening | ORAL | Status: DC
  Filled 2019-12-13: qty 4

## 2019-12-13 MED ORDER — ENOXAPARIN SODIUM 40 MG/0.4ML ~~LOC~~ SOLN: 40.0000 mg | SUBCUTANEOUS | Status: DC

## 2019-12-13 MED ORDER — LEVETIRACETAM 750 MG PO TABS
750.0000 mg | ORAL_TABLET | Freq: Two times a day (BID) | ORAL | Status: DC
  Filled 2019-12-13 (×2): qty 1

## 2019-12-13 MED ORDER — PROPRANOLOL HCL 20 MG PO TABS
20.0000 mg | ORAL_TABLET | Freq: Every day | ORAL | Status: DC
  Filled 2019-12-13: qty 1

## 2019-12-13 MED ORDER — LACTULOSE 10 GM/15ML PO SOLN
30.0000 g | Freq: Three times a day (TID) | ORAL | Status: DC
  Filled 2019-12-13: qty 60

## 2019-12-13 MED ORDER — TRAZODONE HCL 100 MG PO TABS
300.0000 mg | ORAL_TABLET | Freq: Every day | ORAL | Status: DC
  Filled 2019-12-13: qty 3

## 2019-12-13 MED ORDER — LEVETIRACETAM IN NACL 1000 MG/100ML IV SOLN
1000.0000 mg | Freq: Once | INTRAVENOUS | Status: AC
  Administered 2019-12-13: 1000 mg via INTRAVENOUS
  Filled 2019-12-13: qty 100

## 2019-12-13 MED ORDER — FLUTICASONE PROPIONATE 50 MCG/ACT NA SUSP
1.0000 | Freq: Every day | NASAL | Status: DC | PRN
  Filled 2019-12-13: qty 16

## 2019-12-13 NOTE — ED Notes (Signed)
In and out catheterization performed by this RN and Risk manager. Urine specimen collected and sent to lab. Pt clothing removed. Pt placed in gown. External male catheter placed connected to suction. Pt in NAD at this time.

## 2019-12-13 NOTE — ED Notes (Signed)
Pt more alert at this time. Pt groaning and attempting to remove cardiac leads. Incomprehensible speech still at this time.

## 2019-12-13 NOTE — H&P (Signed)
[place]   PATIENT NAME: Adrian Hale    MR#:  [id number]  DATE OF BIRTH:  1960-05-18  DATE OF ADMISSION:  12/20/2019  PRIMARY CARE PHYSICIAN: [facility]   REQUESTING/REFERRING PHYSICIAN: Arta Silence, MD  CHIEF COMPLAINT:   Chief Complaint  Patient presents with  . Seizures    HISTORY OF PRESENT ILLNESS:  Adrian Hale  is a 59 y.o. Caucasian male with a known history of alcoholic liver cirrhosis, COPD, hepatic encephalopathy, dyslipidemia, hypertension alcohol and marijuana abuse, who presented to the emergency room with acute onset of seizures that lasted about 15 minutes followed by postictal confusion and altered mental status.  He was given IV Versed by EMS.  He remained seizure-free for a while in the ER and later had a couple of seizures for which she was given IV Ativan.  He was initially given a gram of IV Keppra prior to having seizures in the ER.  He was fairly somnolent during my interview and therefore poor historian.  He stated his last alcoholic drink was about a week ago.  No reported fever or chills.  No reported headache or dizziness or paresthesias or focal muscle weakness.  No reported nausea or vomiting, chest pain or dyspnea or cough or wheezing.  Upon presentation to the emergency room, vital signs were within normal and pulse symmetry was 96% on 2 of O2 by nasal cannula.  Labs revealed borderline potassium of 3.5 with albumin of 2.6 and total protein of 5.1.  Total bili was 1.7 and alk phos 151.  CBC showed anemia and leukopenia with thrombocytopenia of 98.  Influenza antigens and COVID-19 PCR came back negative.  UA can come back negative and urine drug screen was positive for benzodiazepines and cannabinoids as well as tricyclics.  Noncontrasted head CT scan revealed mild brain atrophy with no acute intracranial normalities. EKG showed sinus rhythm with a rate of 67 with interventricular conduction delay  with atypical right  bundle branch block.  Chest x-ray showed borderline vascular congestion.  The patient was given a gram of IV Keppra as mentioned above and later on IV Ativan.  He will be admitted to a medically monitored unit bed for further evaluation and management.  PAST MEDICAL HISTORY:   Past Medical History:  Diagnosis Date  . ALC (alcoholic liver cirrhosis) ([place])   . Alcohol abuse    NONE SINCE 08/28/2011 AND ATTENDING AA MEETINGS  . Alcoholic cirrhosis of liver ([place])   . Alcoholic peripheral neuropathy ([place])   . Allergy to poison ivy 06/06/2013   RASH  . Anxiety   . Aortic aneurysm ([place])   . Cellulitis of both lower extremities   . Complex partial seizure evolving to generalized seizure ([place])   . COPD (chronic obstructive pulmonary disease) ([place])   . Depression    MAJOR DEPRESSIVE DISORDER  . ED (erectile dysfunction)   . Hepatic encephalopathy ([place])   . Hyperlipidemia   . Hypertension    ESSENTIAL  . Maculopathy   . Marijuana abuse   . MRSA (methicillin resistant staph aureus) culture positive   . Obesity   . Portal hypertension ([place])   . Pseudophakia of both eyes   . Ptosis of both eyelids   . S/P TIPS (transjugular intrahepatic portosystemic shunt)   . Seizures ([place])    Last seizure April 06, 2013, [facility]  . Seizures [facility])   . Sleep apnea    Does not use CPAP  . Thrombocytopenia,  secondary   . Tobacco abuse   . Toxic maculopathy of right eye   . Ulcer of lower limb ([place])    ABSCESS OR CELLULITIS OF LEG    PAST SURGICAL HISTORY:   Past Surgical History:  Procedure Laterality Date  . BREAST SURGERY     BIOPSY  . CATARACT EXTRACTION Right 10/19/2011  . CATARACT EXTRACTION Left 02/22/2012  . COLONOSCOPY    . ESOPHAGOGASTRODUODENOSCOPY    . EYE SURGERY    . [place] Left 10/30/2014   Procedure: HALLUX VALGUS AUSTIN;  Surgeon: Samara Deist, DPM;  Location: [facility] ORS;  Service: Podiatry;  Laterality: Left;  . LIVER SURGERY     TIPS PROCEDURE  .  None    . TIPS PROCEDURE      SOCIAL HISTORY:   Social History   Tobacco Use  . Smoking status: Current Every Day Smoker    Packs/day: 0.50    Years: 15.00    Pack years: 7.50    Types: Cigarettes  . Smokeless tobacco: Never Used  Substance Use Topics  . Alcohol use: Yes    Comment: Twice a month.  He used to drink heavily in the past.    FAMILY HISTORY:   Family History  Problem Relation Age of Onset  . Hypertension Mother   . Hypertension Father     DRUG ALLERGIES:   Allergies  Allergen Reactions  . Poison Ivy Extract   . Poison [employer]   . Poison Ivy Extract [Poison Ivy Extract] Rash    REVIEW OF SYSTEMS:   ROS As per history of present illness. All pertinent systems were reviewed above. Constitutional, HEENT, cardiovascular, respiratory, GI, GU, musculoskeletal, neuro, psychiatric, endocrine, integumentary and hematologic systems were reviewed and are otherwise negative/unremarkable except for positive findings mentioned above in the HPI.   MEDICATIONS AT HOME:   Prior to Admission medications   Medication Sig Start Date End Date Taking? Authorizing Provider  acetaminophen (TYLENOL) 325 MG tablet Take 650 mg by mouth every 4 (four) hours as needed for mild pain or fever.     [provider]  albuterol (PROVENTIL HFA;VENTOLIN HFA) 108 (90 BASE) MCG/ACT inhaler Inhale 2 puffs into the lungs every 6 (six) hours as needed for wheezing or shortness of breath.     [provider]  feeding supplement, ENSURE ENLIVE, (ENSURE ENLIVE) LIQD Take 237 mLs by mouth 2 (two) times daily between meals. 07/14/19   Ezekiel Slocumb, DO  fluorouracil (EFUDEX) 5 % cream Apply 1 application topically daily. (apply to affected areas on back of hands)    [provider]  fluticasone (FLONASE) 50 MCG/ACT nasal spray Place 1 spray into both nostrils daily as needed for allergies or rhinitis.     [provider]  folic acid (FOLVITE) 1 MG tablet  Take 1 mg by mouth daily.    [provider]  furosemide (LASIX) 20 MG tablet Take 20 mg by mouth 2 (two) times daily.    [provider]  ketoconazole (NIZORAL) 2 % cream Apply 1 application topically daily as needed for irritation.     [provider]  lactulose, encephalopathy, (ENULOSE) 10 GM/15ML SOLN Take 45 mLs (30 g total) by mouth in the morning and at bedtime. (may take a third dose if needed) 07/14/19   Ezekiel Slocumb, DO  levETIRAcetam (KEPPRA) 750 MG tablet Take 750 mg by mouth 2 (two) times daily.    [provider]  lidocaine (LIDODERM) 5 %  Place 1 patch onto the skin every 12 (twelve) hours. Remove & Discard patch within 12 hours or as directed by MD 10/28/19 10/27/20  Blake Divine, MD  Melatonin 10 MG TABS Take 10 mg by mouth at bedtime.    [provider]  Milk Thistle 500 MG CAPS Take 1 capsule by mouth daily.     [provider]  Multiple Vitamin (MULTIVITAMIN WITH MINERALS) TABS tablet Take 1 tablet by mouth daily. 07/14/19   Ezekiel Slocumb, DO  propranolol (INDERAL) 20 MG tablet Take 20 mg by mouth daily.    [provider]  rifaximin (XIFAXAN) 550 MG TABS tablet Take 550 mg by mouth 2 (two) times daily.    [provider]  sertraline (ZOLOFT) 50 MG tablet Take 50 mg by mouth daily.     [provider]  spironolactone (ALDACTONE) 50 MG tablet Take 50 mg by mouth 2 (two) times daily.    [provider]  tadalafil (CIALIS) 20 MG tablet Take 20 mg by mouth daily as needed for erectile dysfunction.    [provider]  thiamine 100 MG tablet Take 100 mg by mouth daily.    [provider]  tiZANidine (ZANAFLEX) 2 MG tablet Take 2 mg by mouth every 8 (eight) hours as needed for muscle spasms.     [provider]  traMADol (ULTRAM) 50 MG tablet Take 50 mg by mouth 3 (three) times daily as needed for moderate pain.     [provider]  traZODone (DESYREL)  100 MG tablet Take 300 mg by mouth at bedtime.     [provider]  vitamin E (VITAMIN E) 180 MG (400 UNITS) capsule Take 400 Units by mouth every evening.    [provider]  zonisamide (ZONEGRAN) 100 MG capsule Take 400 mg by mouth at bedtime.    [provider]      VITAL SIGNS:  Blood pressure (!) 112/53, pulse 62, resp. rate (!) 21, height 6' (1.829 m), weight 61.2 kg, SpO2 99 %.  PHYSICAL EXAMINATION:  Physical Exam  GENERAL:  59 y.o.-year-old fairly somnolent but arousable Caucasian male patient lying in the bed with no acute distress.  EYES: Pupils equal, round, reactive to light and accommodation. No scleral icterus. Extraocular muscles intact.  HEENT: Head atraumatic, normocephalic. Oropharynx and nasopharynx clear.  NECK:  Supple, no jugular venous distention. No thyroid enlargement, no tenderness.  LUNGS: Normal breath sounds bilaterally, no wheezing, rales,rhonchi or crepitation. No use of accessory muscles of respiration.  CARDIOVASCULAR: Regular rate and rhythm, S1, S2 normal. No murmurs, rubs, or gallops.  ABDOMEN: Soft, nondistended, nontender. Bowel sounds present. No organomegaly or mass.  EXTREMITIES: No pedal edema, cyanosis, or clubbing.  NEUROLOGIC: Cranial nerves II through XII are intact. Muscle strength 5/5 in all extremities. Sensation intact. Gait not checked.  PSYCHIATRIC: The patient is somnolent but arousable and will easily fall asleep.  Normal affect and good eye contact. SKIN: No obvious rash, lesion, or ulcer.   LABORATORY PANEL:   CBC Recent Labs  Lab 01/05/2020 1830  WBC 3.8*  HGB 9.8*  HCT 26.9*  PLT 98*   ------------------------------------------------------------------------------------------------------------------  Chemistries  Recent Labs  Lab 12/31/2019 1830  NA 137  K 3.5  CL 105  CO2 25  GLUCOSE 97  BUN 10  CREATININE 0.67  CALCIUM 8.3*  AST 40  ALT 29  ALKPHOS 151*  BILITOT 1.7*    ------------------------------------------------------------------------------------------------------------------  Cardiac Enzymes No results for input(s): TROPONINI in  the last 168 hours. ------------------------------------------------------------------------------------------------------------------  RADIOLOGY:  CT Head Wo Contrast  Result Date: 01/07/2020 CLINICAL DATA:  Seizure. Tonic colonic seizure lasting approximately 15-20 minutes. EXAM: CT HEAD WITHOUT CONTRAST TECHNIQUE: Contiguous axial images were obtained from the base of the skull through the vertex without intravenous contrast. COMPARISON:  07/11/2019 FINDINGS: Brain: No evidence of acute infarction, hemorrhage, hydrocephalus, extra-axial collection or mass lesion/mass effect. Mild prominence of the sulci and ventricles. Vascular: No hyperdense vessel or unexpected calcification. Skull: Normal. Negative for fracture or focal lesion. Sinuses/Orbits: No acute finding. Other: None. IMPRESSION: 1. No acute intracranial abnormalities. 2. Mild brain atrophy. Electronically Signed   By: Kerby Moors M.D.   On: 01/10/2020 19:19      IMPRESSION AND PLAN:   1.  Recurrent seizures, possibly alcohol withdrawal seizures. -The patient will be admitted to a medical monitored bed. -Seizure precautions will be placed. -We will begin IV Dilantin given recurrent seizures after IV Keppra and place him on as needed IV Ativan. -We will continue his Keppra and Zonegran. -EEG and neurology consult to be obtained. -I notified Dr. Cheral Marker about the patient.  2.  Acute hepatic encephalopathy. -This likely secondary to noncompliance with lactulose. -She will be placed on lactulose 45 mL p.o. 3 times daily. -We will follow ammonia level. -We will continue rifaximin.  3.  Depression. -We will continue Zoloft.  4.  Polysubstance abuse including alcohol abuse and marijuana abuse. -She was counseled for cessation. -He will be placed on as  needed IV Ativan for alcohol withdrawal.  5.  DVT prophylaxis. -Subcutaneous Lovenox.  All the records are reviewed and case discussed with ED provider. The plan of care was discussed in details with the patient (and family). I answered all questions. The patient agreed to proceed with the above mentioned plan. Further management will depend upon hospital course.   CODE STATUS: Full code  Status is: Inpatient  Remains inpatient appropriate because:Altered mental status, Ongoing diagnostic testing needed not appropriate for outpatient work up, Unsafe d/c plan, IV treatments appropriate due to intensity of illness or inability to take PO and Inpatient level of care appropriate due to severity of illness   Dispo: The patient is from: Home              Anticipated d/c is to: Home              Anticipated d/c date is: 2 days              Patient currently is not medically stable to d/c.   TOTAL TIME TAKING CARE OF THIS PATIENT: 55 minutes.    Christel Mormon M.D on 12/24/2019 at 11:38 PM  [facility]   From 7 PM-7 AM, contact night-coverage [email or web address]  CC: Primary care physician; [facility]

## 2019-12-13 NOTE — ED Triage Notes (Signed)
Pt arrived via EMS for tonic clonic seizure activity lasted approx 15-20 minutes per family.  EMS states pt still seizing on arrival.  Pt was 8mg  versed, 6 mg and 2mg  IM.  Pt remains unresponsive on arrival to ED.

## 2019-12-13 NOTE — ED Notes (Addendum)
Bed alarm alarming. Pt found to be moving around in bed. Pt advised to stay in bed. Pt states he needs to pee. External urinary catheter removed by patient. Pt able to use urinal unassisted. 200 ml output. Urinal emptied. Pt covered with warm blankets. Denies further needs at this time.

## 2019-12-13 NOTE — ED Notes (Addendum)
Bed alarm alarming. Pt states he needs to pee. Pt assisted with placement of urinal. Urinal emptied. Pt covered with warm blankets. Denies further needs at this time.

## 2019-12-13 NOTE — ED Provider Notes (Signed)
[facility] Emergency Department Provider Note ____________________________________________   First MD Initiated Contact with Patient 12/29/2019 1831     (approximate)  I have reviewed the triage vital signs and the nursing notes.   HISTORY  Chief Complaint Seizures  Level 5 caveat: History of present illness limited due to altered mental status  HPI Adrian Hale is a 59 y.o. male with PMH as noted below including history of alcohol abuse, seizure disorder, and COPD who presents by EMS after an apparent seizure.  Per EMS, the family reported that the patient was having generalized seizure activity for as long as 15 minutes.  EMS gave 2 mg of Ativan IM with no response.  They established an IV and gave 4 mg IV and then another 2 mg and the seizure activity stopped although he had some mild twitching while in the ambulance.  The patient is unable to give any history.  Past Medical History:  Diagnosis Date  . ALC (alcoholic liver cirrhosis) ([place])   . Alcohol abuse    NONE SINCE 08/28/2011 AND ATTENDING AA MEETINGS  . Alcoholic cirrhosis of liver ([place])   . Alcoholic peripheral neuropathy ([place])   . Allergy to poison ivy 06/06/2013   RASH  . Anxiety   . Aortic aneurysm ([place])   . Cellulitis of both lower extremities   . Complex partial seizure evolving to generalized seizure ([place])   . COPD (chronic obstructive pulmonary disease) ([place])   . Depression    MAJOR DEPRESSIVE DISORDER  . ED (erectile dysfunction)   . Hepatic encephalopathy ([place])   . Hyperlipidemia   . Hypertension    ESSENTIAL  . Maculopathy   . Marijuana abuse   . MRSA (methicillin resistant staph aureus) culture positive   . Obesity   . Portal hypertension ([place])   . Pseudophakia of both eyes   . Ptosis of both eyelids   . S/P TIPS (transjugular intrahepatic portosystemic shunt)   . Seizures ([place])    Last seizure April 06, 2013, [facility]  . Seizures [facility])   . Sleep apnea     Does not use CPAP  . Thrombocytopenia, secondary   . Tobacco abuse   . Toxic maculopathy of right eye   . Ulcer of lower limb ([place])    ABSCESS OR CELLULITIS OF LEG    Patient Active Problem List   Diagnosis Date Noted  . Chest wall pain 07/11/2019  . Fall at home, initial encounter 07/11/2019  . COPD (chronic obstructive pulmonary disease) ([place])   . GERD (gastroesophageal reflux disease)   . Depression   . Alcohol abuse   . Confusion   . ALC (alcoholic liver cirrhosis) ([place])   . Seizures ([place])   . Tobacco abuse   . Hepatic encephalopathy ([place]) 12/02/2018    Past Surgical History:  Procedure Laterality Date  . BREAST SURGERY     BIOPSY  . CATARACT EXTRACTION Right 10/19/2011  . CATARACT EXTRACTION Left 02/22/2012  . COLONOSCOPY    . ESOPHAGOGASTRODUODENOSCOPY    . EYE SURGERY    . [place] Left 10/30/2014   Procedure: HALLUX VALGUS AUSTIN;  Surgeon: Samara Deist, DPM;  Location: [facility] ORS;  Service: Podiatry;  Laterality: Left;  . LIVER SURGERY     TIPS PROCEDURE  . None    . TIPS PROCEDURE      Prior to Admission medications   Medication Sig Start Date End Date Taking? Authorizing Provider  acetaminophen (TYLENOL) 325 MG tablet Take  650 mg by mouth every 4 (four) hours as needed for mild pain or fever.     [provider]  albuterol (PROVENTIL HFA;VENTOLIN HFA) 108 (90 BASE) MCG/ACT inhaler Inhale 2 puffs into the lungs every 6 (six) hours as needed for wheezing or shortness of breath.     [provider]  feeding supplement, ENSURE ENLIVE, (ENSURE ENLIVE) LIQD Take 237 mLs by mouth 2 (two) times daily between meals. 07/14/19   Ezekiel Slocumb, DO  fluorouracil (EFUDEX) 5 % cream Apply 1 application topically daily. (apply to affected areas on back of hands)    [provider]  fluticasone (FLONASE) 50 MCG/ACT nasal spray Place 1 spray into both nostrils daily as needed for allergies or rhinitis.     [provider]  folic  acid (FOLVITE) 1 MG tablet Take 1 mg by mouth daily.    [provider]  furosemide (LASIX) 20 MG tablet Take 20 mg by mouth 2 (two) times daily.    [provider]  ketoconazole (NIZORAL) 2 % cream Apply 1 application topically daily as needed for irritation.     [provider]  lactulose, encephalopathy, (ENULOSE) 10 GM/15ML SOLN Take 45 mLs (30 g total) by mouth in the morning and at bedtime. (may take a third dose if needed) 07/14/19   Ezekiel Slocumb, DO  levETIRAcetam (KEPPRA) 750 MG tablet Take 750 mg by mouth 2 (two) times daily.    [provider]  lidocaine (LIDODERM) 5 % Place 1 patch onto the skin every 12 (twelve) hours. Remove & Discard patch within 12 hours or as directed by MD 10/28/19 10/27/20  Blake Divine, MD  Melatonin 10 MG TABS Take 10 mg by mouth at bedtime.    [provider]  Milk Thistle 500 MG CAPS Take 1 capsule by mouth daily.     [provider]  Multiple Vitamin (MULTIVITAMIN WITH MINERALS) TABS tablet Take 1 tablet by mouth daily. 07/14/19   Ezekiel Slocumb, DO  propranolol (INDERAL) 20 MG tablet Take 20 mg by mouth daily.    [provider]  rifaximin (XIFAXAN) 550 MG TABS tablet Take 550 mg by mouth 2 (two) times daily.    [provider]  sertraline (ZOLOFT) 50 MG tablet Take 50 mg by mouth daily.     [provider]  spironolactone (ALDACTONE) 50 MG tablet Take 50 mg by mouth 2 (two) times daily.    [provider]  tadalafil (CIALIS) 20 MG tablet Take 20 mg by mouth daily as needed for erectile dysfunction.    [provider]  thiamine 100 MG tablet Take 100 mg by mouth daily.    [provider]  tiZANidine (ZANAFLEX) 2 MG tablet Take 2 mg by mouth every 8 (eight) hours as needed for muscle spasms.     [provider]  traMADol (ULTRAM) 50 MG tablet Take 50 mg by mouth 3 (three) times daily as needed for moderate pain.     [provider]  traZODone (DESYREL) 100 MG tablet Take 300 mg by mouth at bedtime.     [provider]  vitamin E (VITAMIN E) 180 MG (400 UNITS) capsule Take 400 Units by mouth every evening.    [provider]  zonisamide (ZONEGRAN) 100 MG capsule Take 400 mg by mouth at bedtime.    [provider]    Allergies Poison ivy extract, Poison oak extract, and Poison ivy extract [poison ivy extract]  Family History  Problem Relation Age of Onset  . Hypertension Mother   . Hypertension Father     Social History Social History   Tobacco Use  . Smoking status: Current Every Day Smoker    Packs/day: 0.50    Years: 15.00    Pack years: 7.50    Types: Cigarettes  . Smokeless tobacco: Never Used  Substance Use Topics  . Alcohol use: Yes    Comment: Twice a month.  He used to drink heavily in the past.  . Drug use: Yes    Types: Marijuana    Review of Systems Level 5 caveat: Unable to obtain review of systems due to altered mental status    ____________________________________________   PHYSICAL EXAM:  VITAL SIGNS: ED Triage Vitals  Enc Vitals Group     BP 12/18/2019 1829 113/63     Pulse Rate 12/21/2019 1829 67     Resp 12/24/2019 1829 16     Temp --      Temp src --      SpO2 01/07/2020 1827 96 %     Weight 01/05/2020 1829 134 lb 14.7 oz (61.2 kg)     Height 12/30/2019 1829 6' (1.829 m)     Head Circumference --      Peak Flow --      Pain Score 01/01/2020 1829 0     Pain Loc --      Pain Edu? --      Excl. in [place]? --     Constitutional: Somnolent.  Comfortable appearing. Eyes: Conjunctivae are normal.  PERRLA. Head: Atraumatic. Nose: No congestion/rhinnorhea. Mouth/Throat: Mucous membranes are somewhat dry.   Neck: Normal range of motion.  Cardiovascular: Normal rate, regular rhythm. Grossly normal heart sounds.  Good peripheral circulation. Respiratory: Normal respiratory effort.  No retractions. Lungs CTAB. Gastrointestinal: Soft and nontender. No  distention.  Genitourinary: No flank tenderness. Musculoskeletal: Extremities warm and well perfused.  Neurologic: Unable to assess due to postictal state/sedation for medications. Skin:  Skin is warm and dry. No rash noted. Psychiatric: Unable to assess due to mental status.  ____________________________________________   LABS (all labs ordered are listed, but only abnormal results are displayed)  Labs Reviewed  COMPREHENSIVE METABOLIC PANEL - Abnormal; Notable for the following components:      Result Value   Calcium 8.3 (*)    Total Protein 5.1 (*)    Albumin 2.6 (*)    Alkaline Phosphatase 151 (*)    Total Bilirubin 1.7 (*)    All other components within normal limits  CBC WITH DIFFERENTIAL/PLATELET - Abnormal; Notable for the following components:   WBC 3.8 (*)    RBC 2.64 (*)    Hemoglobin 9.8 (*)    HCT 26.9 (*)    MCV 101.9 (*)    MCH 37.1 (*)    MCHC 36.4 (*)    Platelets 98 (*)    All other components within normal limits  URINALYSIS, COMPLETE (UACMP) WITH MICROSCOPIC - Abnormal; Notable for the following components:   Color, Urine YELLOW (*)    APPearance CLEAR (*)    Bacteria, UA RARE (*)    All other components within normal limits  URINE DRUG SCREEN, QUALITATIVE ([facility] ONLY) - Abnormal; Notable for the following components:   Tricyclic, Ur Screen POSITIVE (*)    Cannabinoid 50 Ng, Ur [place] POSITIVE (*)    Benzodiazepine, Ur Scrn POSITIVE (*)    All other components within normal limits  AMMONIA - Abnormal; Notable  for the following components:   Ammonia 108 (*)    All other components within normal limits  RESP PANEL BY RT-PCR (FLU A&B, COVID) ARPGX2  ETHANOL  CBC  COMPREHENSIVE METABOLIC PANEL   ____________________________________________  EKG  ED ECG REPORT I, Arta Silence, the attending physician, personally viewed and interpreted this ECG.  Date: 01/04/2020 EKG Time: 1826 Rate: 68 Rhythm: normal sinus rhythm with PVCs QRS Axis:  normal Intervals: RBBB ST/T Wave abnormalities: normal Narrative Interpretation: no evidence of acute ischemia  ____________________________________________  RADIOLOGY  CT head: No ICH or other acute abnormality  ____________________________________________   PROCEDURES  Procedure(s) performed: No  Procedures  Critical Care performed: Yes  CRITICAL CARE Performed by: Arta Silence   Total critical care time: 30 minutes  Critical care time was exclusive of separately billable procedures and treating other patients.  Critical care was necessary to treat or prevent imminent or life-threatening deterioration.  Critical care was time spent personally by me on the following activities: development of treatment plan with patient and/or surrogate as well as nursing, discussions with consultants, evaluation of patient's response to treatment, examination of patient, obtaining history from patient or surrogate, ordering and performing treatments and interventions, ordering and review of laboratory studies, ordering and review of radiographic studies, pulse oximetry and re-evaluation of patient's condition. ____________________________________________   INITIAL IMPRESSION / ASSESSMENT AND PLAN / ED COURSE  Pertinent labs & imaging results that were available during my care of the patient were reviewed by me and considered in my medical decision making (see chart for details).  59 year old male with a known seizure disorder and other PMH as noted above presents after an apparent generalized tonic-clonic seizure requiring IV Versed by EMS.  I reviewed the past medical records in [place].  The patient was most recent admitted in July with AMS in the setting of heavy alcohol abuse.  He has had a few ED visits since for unrelated complaints.  He has a history of cirrhosis and variceal bleeding status post TIPS shunt.  He has metastatic [place] and is followed at [facility]; he was seen there  yesterday.  He also had a family medicine visit for a well adult health check today.  On exam, the patient is currently somnolent.  He has no visible convulsions or continued seizure activity and is maintaining his airway.  Vital signs are normal.  There is no evidence of trauma.  Exam is otherwise as described above.  Presentation is consistent with his known seizure disorder.  Per EMS, family reported that he was compliant with his medications but had not yet taken his evening Keppra.  I will load with IV Keppra, obtain lab work-up, CT head, and reassess.  ----------------------------------------- 11:54 PM on 12/21/2019 -----------------------------------------  The patient has been gradually waking up although is still fairly somnolent. He was able to tell me his name. He denies any acute complaints. CT head shows no acute findings. The lab work-up is generally reassuring although his ammonia is elevated, suggestive of hepatic encephalopathy. We will observe the patient in the hospital given that he is still not at baseline. I discussed the case with the hospitalist Dr. Sidney Ace.  ____________________________________________   FINAL CLINICAL IMPRESSION(S) / ED DIAGNOSES  Final diagnoses:  Seizure disorder ([place])  Hepatic encephalopathy ([place])      NEW MEDICATIONS STARTED DURING THIS VISIT:  New Prescriptions   No medications on file     Note:  This document was prepared using Dragon voice recognition software and may  include unintentional dictation errors.   Arta Silence, MD 01/07/2020 2356

## 2019-12-14 ENCOUNTER — Inpatient Hospital Stay: Payer: Medicare HMO

## 2019-12-14 DIAGNOSIS — F10239 Alcohol dependence with withdrawal, unspecified: Secondary | ICD-10-CM

## 2019-12-14 LAB — CBC
HCT: 28.8 % — ABNORMAL LOW (ref 39.0–52.0)
Hemoglobin: 10 g/dL — ABNORMAL LOW (ref 13.0–17.0)
MCH: 36.4 pg — ABNORMAL HIGH (ref 26.0–34.0)
MCHC: 34.7 g/dL (ref 30.0–36.0)
MCV: 104.7 fL — ABNORMAL HIGH (ref 80.0–100.0)
Platelets: 99 10*3/uL — ABNORMAL LOW (ref 150–400)
RBC: 2.75 MIL/uL — ABNORMAL LOW (ref 4.22–5.81)
RDW: 15.1 % (ref 11.5–15.5)
WBC: 4.5 10*3/uL (ref 4.0–10.5)
nRBC: 0 % (ref 0.0–0.2)

## 2019-12-14 LAB — COMPREHENSIVE METABOLIC PANEL
ALT: 27 U/L (ref 0–44)
AST: 47 U/L — ABNORMAL HIGH (ref 15–41)
Albumin: 2.5 g/dL — ABNORMAL LOW (ref 3.5–5.0)
Alkaline Phosphatase: 142 U/L — ABNORMAL HIGH (ref 38–126)
Anion gap: 8 (ref 5–15)
BUN: 8 mg/dL (ref 6–20)
CO2: 21 mmol/L — ABNORMAL LOW (ref 22–32)
Calcium: 8.3 mg/dL — ABNORMAL LOW (ref 8.9–10.3)
Chloride: 109 mmol/L (ref 98–111)
Creatinine, Ser: 0.55 mg/dL — ABNORMAL LOW (ref 0.61–1.24)
GFR, Estimated: >60 mL/min (ref >60)
Glucose, Bld: 97 mg/dL (ref 70–99)
Potassium: 4.1 mmol/L (ref 3.5–5.1)
Sodium: 138 mmol/L (ref 135–145)
Total Bilirubin: 1.8 mg/dL — ABNORMAL HIGH (ref 0.3–1.2)
Total Protein: 5.1 g/dL — ABNORMAL LOW (ref 6.5–8.1)

## 2019-12-14 LAB — MAGNESIUM: Magnesium: 1.8 mg/dL (ref 1.7–2.4)

## 2019-12-14 LAB — AMMONIA: Ammonia: 74 umol/L — ABNORMAL HIGH (ref 9–35)

## 2019-12-14 MED ORDER — LACTULOSE 10 GM/15ML PO SOLN
30.0000 g | Freq: Three times a day (TID) | ORAL | Status: DC
  Administered 2019-12-14 – 2019-12-18 (×12): 30 g
  Filled 2019-12-14 (×11): qty 60

## 2019-12-14 MED ORDER — POTASSIUM CHLORIDE 20 MEQ PO PACK: 40.0000 meq | PACK | Freq: Once | ORAL | Status: DC

## 2019-12-14 MED ORDER — SODIUM CHLORIDE 0.9 % IV SOLN
20.0000 mg/kg | Freq: Once | INTRAVENOUS | Status: AC
  Administered 2019-12-14: 1224 mg via INTRAVENOUS
  Filled 2019-12-14: qty 24.48

## 2019-12-14 MED ORDER — RIFAXIMIN 550 MG PO TABS
550.0000 mg | ORAL_TABLET | Freq: Two times a day (BID) | ORAL | Status: DC
  Administered 2019-12-15 – 2019-12-18 (×7): 550 mg
  Filled 2019-12-14 (×8): qty 1

## 2019-12-14 MED ORDER — ZONISAMIDE 100 MG PO CAPS
400.0000 mg | ORAL_CAPSULE | Freq: Every day | ORAL | Status: DC
  Administered 2019-12-15 – 2019-12-17 (×3): 400 mg via ORAL
  Filled 2019-12-14 (×5): qty 4

## 2019-12-14 MED ORDER — LORAZEPAM 2 MG/ML IJ SOLN: 2.0000 mg | Freq: Once | INTRAMUSCULAR | Status: AC

## 2019-12-14 MED ORDER — PROPRANOLOL HCL 20 MG PO TABS
20.0000 mg | ORAL_TABLET | Freq: Every day | ORAL | Status: DC
  Administered 2019-12-15 – 2019-12-18 (×4): 20 mg
  Filled 2019-12-14 (×3): qty 1

## 2019-12-14 MED ORDER — LEVETIRACETAM IN NACL 1000 MG/100ML IV SOLN
1000.0000 mg | Freq: Two times a day (BID) | INTRAVENOUS | Status: DC
  Administered 2019-12-14 – 2019-12-15 (×3): 1000 mg via INTRAVENOUS
  Filled 2019-12-14 (×5): qty 100

## 2019-12-14 MED ORDER — FUROSEMIDE 10 MG/ML IJ SOLN: 40.0000 mg | Freq: Once | INTRAMUSCULAR | Status: DC

## 2019-12-14 MED ORDER — VITAMIN E 45 MG (100 UNIT) PO CAPS
400.0000 [IU] | ORAL_CAPSULE | Freq: Every evening | ORAL | Status: DC
  Administered 2019-12-15 – 2019-12-16 (×2): 400 [IU]
  Filled 2019-12-14 (×4): qty 4

## 2019-12-14 MED ORDER — ACETAMINOPHEN 650 MG RE SUPP
650.0000 mg | RECTAL | Status: DC | PRN
  Administered 2019-12-17: 650 mg via RECTAL
  Filled 2019-12-14: qty 1

## 2019-12-14 MED ORDER — SERTRALINE HCL 50 MG PO TABS
50.0000 mg | ORAL_TABLET | Freq: Every day | ORAL | Status: DC
  Administered 2019-12-15: 50 mg
  Filled 2019-12-14: qty 1

## 2019-12-14 MED ORDER — MORPHINE SULFATE 15 MG PO TABS: 15.0000 mg | ORAL_TABLET | ORAL | Status: DC | PRN

## 2019-12-14 MED ORDER — ACETAMINOPHEN 160 MG/5ML PO SOLN
650.0000 mg | ORAL | Status: DC | PRN
  Filled 2019-12-14: qty 20.3

## 2019-12-14 MED ORDER — ADULT MULTIVITAMIN W/MINERALS CH
1.0000 | ORAL_TABLET | Freq: Every day | ORAL | Status: DC
  Administered 2019-12-15 – 2019-12-18 (×4): 1
  Filled 2019-12-14 (×4): qty 1

## 2019-12-14 MED ORDER — TIZANIDINE HCL 2 MG PO TABS
2.0000 mg | ORAL_TABLET | Freq: Three times a day (TID) | ORAL | Status: DC | PRN
  Filled 2019-12-14: qty 1

## 2019-12-14 MED ORDER — POTASSIUM CHLORIDE 10 MEQ/100ML IV SOLN: 10.0000 meq | INTRAVENOUS | Status: AC

## 2019-12-14 MED ORDER — ACETAMINOPHEN 650 MG RE SUPP: 650.0000 mg | RECTAL | Status: DC | PRN

## 2019-12-14 MED ORDER — SODIUM CHLORIDE 0.9 % IV SOLN
1.0000 mg | Freq: Once | INTRAVENOUS | Status: DC
  Filled 2019-12-14: qty 0.2

## 2019-12-14 MED ORDER — SPIRONOLACTONE 25 MG PO TABS
50.0000 mg | ORAL_TABLET | Freq: Two times a day (BID) | ORAL | Status: DC
  Administered 2019-12-15 – 2019-12-18 (×7): 50 mg
  Filled 2019-12-14 (×7): qty 2

## 2019-12-14 MED ORDER — MORPHINE SULFATE (PF) 2 MG/ML IV SOLN: 1.0000 mg | INTRAVENOUS | Status: DC | PRN

## 2019-12-14 MED ORDER — TRAZODONE HCL 100 MG PO TABS: 300.0000 mg | ORAL_TABLET | Freq: Every day | ORAL | Status: DC

## 2019-12-14 MED ORDER — MAGNESIUM HYDROXIDE 400 MG/5ML PO SUSP: 30.0000 mL | Freq: Every day | ORAL | Status: DC | PRN

## 2019-12-14 MED ORDER — LORAZEPAM 2 MG/ML IJ SOLN
INTRAMUSCULAR | Status: AC
  Administered 2019-12-14: 2 mg via INTRAVENOUS
  Filled 2019-12-14: qty 1

## 2019-12-14 MED ORDER — ACETAMINOPHEN 160 MG/5ML PO SOLN
650.0000 mg | ORAL | Status: DC | PRN
  Filled 2019-12-14 (×3): qty 20.3

## 2019-12-14 MED ORDER — PHENYTOIN SODIUM 50 MG/ML IJ SOLN
100.0000 mg | Freq: Three times a day (TID) | INTRAMUSCULAR | Status: DC
  Administered 2019-12-14: 100 mg via INTRAVENOUS

## 2019-12-14 MED ORDER — MELATONIN 5 MG PO TABS: 10.0000 mg | ORAL_TABLET | Freq: Every day | ORAL | Status: DC

## 2019-12-14 MED ORDER — ACETAMINOPHEN 325 MG PO TABS: 650.0000 mg | ORAL_TABLET | ORAL | Status: DC | PRN

## 2019-12-14 MED ORDER — LORAZEPAM 2 MG/ML IJ SOLN
2.0000 mg | INTRAMUSCULAR | Status: DC | PRN
  Administered 2019-12-15 – 2019-12-18 (×5): 2 mg via INTRAVENOUS
  Filled 2019-12-14 (×5): qty 1

## 2019-12-14 MED ORDER — FUROSEMIDE 10 MG/ML IJ SOLN
10.0000 mg | Freq: Two times a day (BID) | INTRAMUSCULAR | Status: DC
  Administered 2019-12-14 – 2019-12-18 (×8): 10 mg via INTRAVENOUS
  Filled 2019-12-14 (×2): qty 2
  Filled 2019-12-14: qty 4
  Filled 2019-12-14: qty 2
  Filled 2019-12-14: qty 4
  Filled 2019-12-14 (×2): qty 2
  Filled 2019-12-14: qty 4

## 2019-12-14 MED ORDER — ENSURE ENLIVE PO LIQD
237.0000 mL | Freq: Two times a day (BID) | ORAL | Status: DC
  Administered 2019-12-16 – 2019-12-17 (×3): 237 mL

## 2019-12-14 MED ORDER — THIAMINE HCL 100 MG/ML IJ SOLN
100.0000 mg | Freq: Every day | INTRAMUSCULAR | Status: DC
  Administered 2019-12-15: 100 mg via INTRAVENOUS
  Filled 2019-12-14: qty 2

## 2019-12-14 NOTE — Progress Notes (Signed)
MEDICATION RELATED CONSULT NOTE - INITIAL   Pharmacy Consult for  Drug Interactions for anti-epileptic meds  Indication:  Seizure disorder   Allergies  Allergen Reactions  . Poison Ivy Extract   . Poison [employer]   . Poison Ivy Extract [Poison Ivy Extract] Rash    Patient Measurements: Height: 6' (182.9 cm) Weight: 61.2 kg (134 lb 14.7 oz) IBW/kg (Calculated) : 77.6 Adjusted Body Weight:   Vital Signs: BP: 111/62 (12/03 0230) Pulse Rate: 63 (12/03 0230) Intake/Output from previous day: 12/02 0701 - 12/03 0700 In: 70 [IV Piggyback:50] Out: 500 [Urine:500] Intake/Output from this shift: Total I/O In: 50 [IV Piggyback:50] Out: 500 [Urine:500]  Labs: Recent Labs    01/07/2020 1830  WBC 3.8*  HGB 9.8*  HCT 26.9*  PLT 98*  CREATININE 0.67  ALBUMIN 2.6*  PROT 5.1*  AST 40  ALT 29  ALKPHOS 151*  BILITOT 1.7*   Estimated Creatinine Clearance: 85 mL/min (by C-G formula based on SCr of 0.67 mg/dL).   Microbiology: Recent Results (from the past 720 hour(s))  Resp Panel by RT-PCR (Flu A&B, Covid) Nasopharyngeal Swab     Status: None   Collection Time: 12/20/2019  8:35 PM   Specimen: Nasopharyngeal Swab; Nasopharyngeal(NP) swabs in vial transport medium  Result Value Ref Range Status   SARS Coronavirus 2 by RT PCR NEGATIVE NEGATIVE Final    Comment: (NOTE) SARS-CoV-2 target nucleic acids are NOT DETECTED.  The SARS-CoV-2 RNA is generally detectable in upper respiratory specimens during the acute phase of infection. The lowest concentration of SARS-CoV-2 viral copies this assay can detect is 138 copies/mL. A negative result does not preclude SARS-Cov-2 infection and should not be used as the sole basis for treatment or other patient management decisions. A negative result may occur with  improper specimen collection/handling, submission of specimen other than nasopharyngeal swab, presence of viral mutation(s) within the areas targeted by this assay, and inadequate  number of viral copies(<138 copies/mL). A negative result must be combined with clinical observations, patient history, and epidemiological information. The expected result is Negative.  Fact Sheet for Patients:  EntrepreneurPulse.com.au  Fact Sheet for Healthcare Providers:  IncredibleEmployment.be  This test is no t yet approved or cleared by the [place] FDA and  has been authorized for detection and/or diagnosis of SARS-CoV-2 by FDA under an Emergency Use Authorization (EUA). This EUA will remain  in effect (meaning this test can be used) for the duration of the COVID-19 declaration under Section 564(b)(1) of the Act, 21 U.S.C.section 360bbb-3(b)(1), unless the authorization is terminated  or revoked sooner.       Influenza A by PCR NEGATIVE NEGATIVE Final   Influenza B by PCR NEGATIVE NEGATIVE Final    Comment: (NOTE) The Xpert Xpress SARS-CoV-2/FLU/RSV plus assay is intended as an aid in the diagnosis of influenza from Nasopharyngeal swab specimens and should not be used as a sole basis for treatment. Nasal washings and aspirates are unacceptable for Xpert Xpress SARS-CoV-2/FLU/RSV testing.  Fact Sheet for Patients: EntrepreneurPulse.com.au  Fact Sheet for Healthcare Providers: IncredibleEmployment.be  This test is not yet approved or cleared by the [place] FDA and has been authorized for detection and/or diagnosis of SARS-CoV-2 by FDA under an Emergency Use Authorization (EUA). This EUA will remain in effect (meaning this test can be used) for the duration of the COVID-19 declaration under Section 564(b)(1) of the Act, 21 U.S.C. section 360bbb-3(b)(1), unless the authorization is terminated or revoked.  Performed at [facility], [phone]  [address], [place], [place] [address]     Medical History: Past Medical History:  Diagnosis Date  . ALC (alcoholic liver cirrhosis)  ([place])   . Alcohol abuse    NONE SINCE 08/28/2011 AND ATTENDING AA MEETINGS  . Alcoholic cirrhosis of liver ([place])   . Alcoholic peripheral neuropathy ([place])   . Allergy to poison ivy 06/06/2013   RASH  . Anxiety   . Aortic aneurysm ([place])   . Cellulitis of both lower extremities   . Complex partial seizure evolving to generalized seizure ([place])   . COPD (chronic obstructive pulmonary disease) ([place])   . Depression    MAJOR DEPRESSIVE DISORDER  . ED (erectile dysfunction)   . Hepatic encephalopathy ([place])   . Hyperlipidemia   . Hypertension    ESSENTIAL  . Maculopathy   . Marijuana abuse   . MRSA (methicillin resistant staph aureus) culture positive   . Obesity   . Portal hypertension ([place])   . Pseudophakia of both eyes   . Ptosis of both eyelids   . S/P TIPS (transjugular intrahepatic portosystemic shunt)   . Seizures ([place])    Last seizure April 06, 2013, [facility]  . Seizures [facility]   . Sleep apnea    Does not use CPAP  . Thrombocytopenia, secondary   . Tobacco abuse   . Toxic maculopathy of right eye   . Ulcer of lower limb (H[facility])    ABSCESS OR CELLULITIS OF LEG    Medications:  (Not in a hospital admission)   Assessment: Pharmacy consulted to assess drug interactions in this 59 year old male with hepatic encephalopathy.  Was on keppra 750 mg PO B15V PTA, uncertain of last dose.  Also on zonegran 400 mg PO QHS. Pt experienced seizure on 12/03 @ ~ 0100.   Lorazepam and phenytoin were added to regimen for status epilepticus.   Goal of Therapy:  Pt seizure free, no significant drug interactions.   Plan:  Per micromedex, no clinically significant drug interactions at present.   Adrian Hale D 12/14/2019,3:08 AM

## 2019-12-14 NOTE — ED Notes (Signed)
Pt had seizure lasting 1 min at this time. Pt given 1mg  ativan as ordered. MD mansy aware

## 2019-12-14 NOTE — Progress Notes (Signed)
PHARMACIST - PHYSICIAN ORDER COMMUNICATION  CONCERNING: P&T Medication Policy on Herbal Medications  DESCRIPTION:  This patient's order for:  Milk Thistle has been noted.  This product(s) is classified as an "herbal" or natural product. Due to a lack of definitive safety studies or FDA approval, nonstandard manufacturing practices, plus the potential risk of unknown drug-drug interactions while on inpatient medications, the Pharmacy and Therapeutics Committee does not permit the use of "herbal" or natural products of this type within [place].   ACTION TAKEN: The pharmacy department is unable to verify this order at this time and your patient has been informed of this safety policy. Please reevaluate patient's clinical condition at discharge and address if the herbal or natural product(s) should be resumed at that time.   

## 2019-12-14 NOTE — Consult Note (Addendum)
NEURO HOSPITALIST CONSULT NOTE   Requestig physician: Dr. Manuella Ghazi  Reason for Consult: Breakthrough seizures  History obtained from:  Chart    HPI:                                                                                                                                          Adrian Hale is an 59 y.o. male with a complex PMHx including alcoholic liver cirrhosis, alcoholic peripheral neuropathy, complex partial seizures, COPD, hepatic encephalopathy, HLD, essential HTN, maculopathy, portal HTN, obesity, sleep apnea, tobacco abuse and cellulitis of BLE with ulcer of lower limb who presented to the ED after having breakthrough seizures that lasted about 15 minutes followed by postictal state. Versed was administered by EMS. While in the ED he was loaded with 1000 mg IV Keppra. He then had 2 more seizures, for which he was given Ativan IV. He stated that his last drink of EtOH was about 1 week ago.   CBC showed anemia, leukopenia and thrombocytopenia. Covid negative. Utox positive for cannabinoids, benzodiazepines and trycyclics. CT head showed mild brain atrophy with no acute abnormality.   His anticonvulsant regimen consists of Keppra 750 mg BID and Zonegran 400 mg QHS. His extensive medications list also includes Rifaximin and thiamine. He also takes Ultram, a medication which can lower the seizure threshold.   Dilantin has been added to his anticonvulsant regimen this admission.   Past Medical History:  Diagnosis Date  . ALC (alcoholic liver cirrhosis) ([place])   . Alcohol abuse    NONE SINCE 08/28/2011 AND ATTENDING AA MEETINGS  . Alcoholic cirrhosis of liver ([place])   . Alcoholic peripheral neuropathy ([place])   . Allergy to poison ivy 06/06/2013   RASH  . Anxiety   . Aortic aneurysm ([place])   . Cellulitis of both lower extremities   . Complex partial seizure evolving to generalized seizure ([place])   . COPD (chronic obstructive pulmonary disease) ([place])   .  Depression    MAJOR DEPRESSIVE DISORDER  . ED (erectile dysfunction)   . Hepatic encephalopathy ([place])   . Hyperlipidemia   . Hypertension    ESSENTIAL  . Maculopathy   . Marijuana abuse   . MRSA (methicillin resistant staph aureus) culture positive   . Obesity   . Portal hypertension ([place])   . Pseudophakia of both eyes   . Ptosis of both eyelids   . S/P TIPS (transjugular intrahepatic portosystemic shunt)   . Seizures ([place])    Last seizure April 06, 2013,  [place]  . Seizures [facility])   . Sleep apnea    Does not use CPAP  . Thrombocytopenia, secondary   . Tobacco abuse   . Toxic maculopathy of right eye   . Ulcer of lower limb ([place])  ABSCESS OR CELLULITIS OF LEG    Past Surgical History:  Procedure Laterality Date  . BREAST SURGERY     BIOPSY  . CATARACT EXTRACTION Right 10/19/2011  . CATARACT EXTRACTION Left 02/22/2012  . COLONOSCOPY    . ESOPHAGOGASTRODUODENOSCOPY    . EYE SURGERY    . [place] Left 10/30/2014   Procedure: HALLUX VALGUS AUSTIN;  Surgeon: Samara Deist, DPM;  Location: [facility] ORS;  Service: Podiatry;  Laterality: Left;  . LIVER SURGERY     TIPS PROCEDURE  . None    . TIPS PROCEDURE      Family History  Problem Relation Age of Onset  . Hypertension Mother   . Hypertension Father               Social History:  reports that he has been smoking cigarettes. He has a 7.50 pack-year smoking history. He has never used smokeless tobacco. He reports current alcohol use. He reports current drug use. Drug: Marijuana.  Allergies  Allergen Reactions  . Poison Ivy Extract   . Poison [employer]   . Poison Ivy Extract [Poison Ivy Extract] Rash    MEDICATIONS:                                                                                                                     No current facility-administered medications on file prior to encounter.   Current Outpatient Medications on File Prior to Encounter  Medication Sig Dispense Refill   . acetaminophen (TYLENOL) 325 MG tablet Take 650 mg by mouth every 4 (four) hours as needed for mild pain or fever.     Marland Kitchen albuterol (PROVENTIL HFA;VENTOLIN HFA) 108 (90 BASE) MCG/ACT inhaler Inhale 2 puffs into the lungs every 6 (six) hours as needed for wheezing or shortness of breath.     . fluorouracil (EFUDEX) 5 % cream Apply 1 application topically daily. (apply to affected areas on back of hands)    . fluticasone (FLONASE) 50 MCG/ACT nasal spray Place 1 spray into both nostrils daily as needed for allergies or rhinitis.     . folic acid (FOLVITE) 1 MG tablet Take 1 mg by mouth daily.    . furosemide (LASIX) 20 MG tablet Take 20 mg by mouth 2 (two) times daily.    Marland Kitchen ketoconazole (NIZORAL) 2 % cream Apply 1 application topically daily as needed for irritation.     Marland Kitchen lactulose, encephalopathy, (ENULOSE) 10 GM/15ML SOLN Take 45 mLs (30 g total) by mouth in the morning and at bedtime. (may take a third dose if needed) 946 mL 1  . levETIRAcetam (KEPPRA) 750 MG tablet Take 750 mg by mouth 2 (two) times daily.    . Melatonin 10 MG TABS Take 10 mg by mouth at bedtime.    . Milk Thistle 500 MG CAPS Take 1 capsule by mouth daily.     . Multiple Vitamin (MULTIVITAMIN WITH MINERALS) TABS tablet Take 1 tablet by mouth daily.    . propranolol (INDERAL)  20 MG tablet Take 20 mg by mouth daily.    . rifaximin (XIFAXAN) 550 MG TABS tablet Take 550 mg by mouth 2 (two) times daily.    . sertraline (ZOLOFT) 50 MG tablet Take 50 mg by mouth daily.     Marland Kitchen spironolactone (ALDACTONE) 50 MG tablet Take 50 mg by mouth 2 (two) times daily.    Marland Kitchen thiamine 100 MG tablet Take 100 mg by mouth daily.    Marland Kitchen tiZANidine (ZANAFLEX) 2 MG tablet Take 2 mg by mouth every 8 (eight) hours as needed for muscle spasms.     . traZODone (DESYREL) 100 MG tablet Take 300 mg by mouth at bedtime.     . vitamin E (VITAMIN E) 180 MG (400 UNITS) capsule Take 400 Units by mouth every evening.    . zonisamide (ZONEGRAN) 100 MG capsule Take 400 mg  by mouth at bedtime.    . feeding supplement, ENSURE ENLIVE, (ENSURE ENLIVE) LIQD Take 237 mLs by mouth 2 (two) times daily between meals. 237 mL 12  . lidocaine (LIDODERM) 5 % Place 1 patch onto the skin every 12 (twelve) hours. Remove & Discard patch within 12 hours or as directed by MD (Patient not taking: Reported on 12/14/2019) 10 patch 0  . tadalafil (CIALIS) 20 MG tablet Take 20 mg by mouth daily as needed for erectile dysfunction. (Patient not taking: Reported on 12/14/2019)    . traMADol (ULTRAM) 50 MG tablet Take 50 mg by mouth 3 (three) times daily as needed for moderate pain.  (Patient not taking: Reported on 12/14/2019)       Scheduled: . enoxaparin (LOVENOX) injection  40 mg Subcutaneous Q24H  . feeding supplement  237 mL Oral BID BM  . fluorouracil  1 application Topical Daily  . folic acid  1 mg Oral Daily  . furosemide  20 mg Oral BID  . lactulose  30 g Per Tube TID  . levETIRAcetam  750 mg Oral BID  . lidocaine  1 patch Transdermal Q12H  . melatonin  10 mg Oral QHS  . multivitamin with minerals  1 tablet Oral Daily  . phenytoin (DILANTIN) IV  100 mg Intravenous Q8H  . potassium chloride  40 mEq Oral Once  . propranolol  20 mg Oral Daily  . rifaximin  550 mg Oral BID  . sertraline  50 mg Oral Daily  . spironolactone  50 mg Oral BID  . thiamine  100 mg Oral Daily  . traZODone  300 mg Oral QHS  . vitamin E  400 Units Oral QPM  . zonisamide  400 mg Oral QHS   Continuous: . sodium chloride 75 mL/hr (12/14/19 0141)     ROS:                                                                                                                                       Unable to obtain  due to AMS.    Blood pressure 128/63, pulse 65, resp. rate 15, height 6' (1.829 m), weight 61.2 kg, SpO2 96 %.   General Examination:                                                                                                       Physical Exam  HEENT-  [place]/AT. No nuchal rigidity.  Lungs-  Respirations unlabored Skin: Jaundiced  Neurological Examination Mental Status: Somnolent and postictal. Will open eyes briefly and exclaim to sustained light sternal rub. Will follow simple commands. Answered 5 when shown five fingers and did protrude tongue to command, but otherwise did not respond appropriately to questions or commands, murmuring incoherently.  Cranial Nerves: II: Briefly fixated on examiner's face after sustained light sternal rub. PERRL III,IV, VI: Palpebral fissures are symmetric when eyes are briefly open. Eyes conjugate and near the midline. Did not follow commands for visual tracking.  V,VII: Face symmetric. Responded to tactile stimuli.  VIII: Hearing intact to voice IX,X: Unable to visualize palate XI: No asymmetry noted.  XII: Midline tongue extension Motor: Moves all 4 extremities spontaneously when responding in agitated fashion after sustained light sternal rub. Unable to formally test strength as not following commands and also not withdrawing vigorously to noxious stimuli. In this context, limb movements are symmetric.  Sensory: Weakly withdraws BLE to noxious plantar stimulation.  Deep Tendon Reflexes: 2+ bilateral brachioradialis and patellar reflexes.  Plantars: Upgoing bilaterally  Cerebellar: Unable to assess except for postural tremor when patient held left arm antigravity following noxious plantar stimulation.  Gait: Unable to assess   Lab Results: Basic Metabolic Panel: Recent Labs  Lab 12/16/2019 1830 12/14/19 0539  NA 137 138  K 3.5 4.1  CL 105 109  CO2 25 21*  GLUCOSE 97 97  BUN 10 8  CREATININE 0.67 0.55*  CALCIUM 8.3* 8.3*  MG  --  1.8    CBC: Recent Labs  Lab 01/08/2020 1830  WBC 3.8*  NEUTROABS 2.3  HGB 9.8*  HCT 26.9*  MCV 101.9*  PLT 98*    Cardiac Enzymes: No results for input(s): CKTOTAL, CKMB, CKMBINDEX, TROPONINI in the last 168 hours.  Lipid Panel: No results for input(s): CHOL, TRIG, HDL, CHOLHDL, VLDL, LDLCALC  in the last 168 hours.  Imaging: CT Head Wo Contrast  Result Date: 01/02/2020 CLINICAL DATA:  Seizure. Tonic colonic seizure lasting approximately 15-20 minutes. EXAM: CT HEAD WITHOUT CONTRAST TECHNIQUE: Contiguous axial images were obtained from the base of the skull through the vertex without intravenous contrast. COMPARISON:  07/11/2019 FINDINGS: Brain: No evidence of acute infarction, hemorrhage, hydrocephalus, extra-axial collection or mass lesion/mass effect. Mild prominence of the sulci and ventricles. Vascular: No hyperdense vessel or unexpected calcification. Skull: Normal. Negative for fracture or focal lesion. Sinuses/Orbits: No acute finding. Other: None. IMPRESSION: 1. No acute intracranial abnormalities. 2. Mild brain atrophy. Electronically Signed   By: Kerby Moors M.D.   On: 12/18/2019 19:19    Assessment: 59 year old male with an extensive PMHx including alcoholic liver disease and epilepsy, presenting with breakthrough  seizures.  1. Exam reveals a somnolent to obtunded older-appearing male with jaundice. No focal weakness seen on limited exam. No adventitious movements or automatisms to suggest seizure activity.  2. His outpatient anticonvulsant regimen consists of Keppra 750 mg BID and Zonegran 400 mg QHS. He has been continued on these medications.  3. He was started on Dilantin 100 mg TID this admission. This is not an optimal choice given his liver disease.  4. His extensive medications list also includes Ultram, a medication which can lower the seizure threshold.  5. Hyperammonemia. Most likely due to his liver disease, but can also occur after seizures.  6. Although EtOH withdrawal is suspected, seizure recurrence due to Ultram use, hyperammonemia or spontaneous breakthrough seizure is also on the DDx.   Recommendations: 1. CIWA protocol 2. Discontinuing home Ultram 3. Increase Keppra to 1000 mg BID and change to IV (ordered) 4. Continue Zonegran 400 mg QHS. Will need  NG tube for this (NGT ordered).  5. Given the patient's liver disease, Dilantin is being discontinued, as it is hepatically metabolized. As noted above, Keppra dosage is being increased as an alternative.  6. Monitoring and treatment of his hyperammonemia.  7. When awake and alert, will need to review outpatient seizure precautions with the patient: Per [facility] statutes, patients with seizures are not allowed to drive until  they have been seizure-free for six months. Use caution when using heavy equipment or power tools. Avoid working on ladders or at heights. Take showers instead of baths. Ensure the water temperature is not too high on the home water heater. Do not go swimming alone. When caring for infants or small children, sit down when holding, feeding, or changing them to minimize risk of injury to the child in the event you have a seizure. Also, Maintain good sleep hygiene. Avoid alcohol.    Electronically signed: Dr. Kerney Elbe 12/14/2019, 8:06 AM

## 2019-12-14 NOTE — ED Notes (Signed)
Pt resting comfortably at this time. No distress noted. Call bell in reach. IVF. NAD noted.

## 2019-12-14 NOTE — ED Notes (Signed)
Pt continuing to seize at this time.

## 2019-12-14 NOTE — ED Notes (Signed)
Provided update to with via phone.

## 2019-12-14 NOTE — ED Notes (Signed)
Pt ceased seizure activity at this time.

## 2019-12-14 NOTE — ED Notes (Signed)
Report to Anderson Malta, Therapist, sports. Pt moved to admission holding area.

## 2019-12-14 NOTE — Progress Notes (Signed)
PROGRESS NOTE    Adrian Hale   CWC:[id number]  DOB: [birth year]  PCP: [facility]    DOA: 01/07/2020 LOS: 1   Brief Narrative  59 y.o. Caucasian male with a known history of alcoholic liver cirrhosis, COPD, hepatic encephalopathy, dyslipidemia, hypertension alcohol and marijuana abuse, who presented to the emergency room with acute onset of seizures that lasted about 15 minutes followed by postictal confusion and altered mental status.  All information is taken from history and physical dictated by admitting hospitalist. Assessment & Plan   Active Problems:   Hepatic encephalopathy ([place])  Recurrent seizures -breakthrough seizures -Neurology seen, appreciate input -Seizure precaution -Stopping Ultram and Dilantin -Continue Keppra and Zonegran per neurology and adjust dose as needed  2.  Acute hepatic encephalopathy. -This likely secondary to noncompliance with lactulose. -Continue lactulose and rifaximin. -We will follow ammonia level.  3.  Depression. - continue Zoloft and trazodone.  4.  Polysubstance abuse including alcohol abuse and marijuana abuse. -He was counseled for cessation. -Continue CIWA protocol  Alcoholic liver cirrhosis - chronic  COPD - Stable  DVT prophylaxis: enoxaparin (LOVENOX) injection 40 mg Start: 12/14/19 1000   Diet:  Diet Orders (From admission, onward)    Start     Ordered   12/14/2019 2328  Diet Heart Room service appropriate? Yes; Fluid consistency: Thin  Diet effective now       Question Answer Comment  Room service appropriate? Yes   Fluid consistency: Thin      01/04/2020 2330            Code Status: Full Code    Subjective 12/14/19   Had seizure overnight requiring IV Ativan.  He is somnolent and lethargic when I saw Disposition Plan & Communication   Status is: Inpatient  Remains inpatient appropriate because:Ongoing diagnostic testing needed not appropriate for outpatient work up   Dispo: The  patient is from: Home              Anticipated d/c is to: Home              Anticipated d/c date is: 3 days              Patient currently is not medically stable to d/c.  Will need to monitor him for seizure and clinical improvement.   Family Communication: none at bedside, patient asked not to speak to his spouse on last admission  Consults, Procedures, Significant Events   Consultants:   Neurology   Objective   Vitals:   12/14/19 0630 12/14/19 0800 12/14/19 1100 12/14/19 1400  BP: 128/63 128/69 109/83 (!) 123/58  Pulse: 65 (!) 56 (!) 55 61  Resp: 15  19 (!) 25  SpO2: 96% 96% 96% 97%  Weight:      Height:        Intake/Output Summary (Last 24 hours) at 12/14/2019 1552 Last data filed at 12/14/2019 0220 Gross per 24 hour  Intake 50 ml  Output 500 ml  Net -450 ml   Filed Weights   12/15/2019 1829  Weight: 61.2 kg    Physical Exam:  General exam: Sleepy and lethargic, no acute distress, chronically ill appearing Respiratory system: CTAB, shallow inspirations due to rib pain, no wheezes, rales or rhonchi, normal respiratory effort. Cardiovascular system: normal S1/S2, RRR, no pedal edema.   Gastrointestinal system: soft, mildly tender on right, ND Central nervous system: Sleepy and lethargic.  Moving all extremities voluntarily Extremities: moves all, normal tone, no edema Psychiatry:  Difficult to evaluate considering his mental status   Labs   Data Reviewed: I have personally reviewed following labs and imaging studies  CBC: Recent Labs  Lab 12/25/2019 1830 12/14/19 0737  WBC 3.8* 4.5  NEUTROABS 2.3  --   HGB 9.8* 10.0*  HCT 26.9* 28.8*  MCV 101.9* 104.7*  PLT 98* 99*   Basic Metabolic Panel: Recent Labs  Lab 12/28/2019 1830 12/14/19 0539  NA 137 138  K 3.5 4.1  CL 105 109  CO2 25 21*  GLUCOSE 97 97  BUN 10 8  CREATININE 0.67 0.55*  CALCIUM 8.3* 8.3*  MG  --  1.8   GFR: Estimated Creatinine Clearance: 85 mL/min (A) (by C-G formula based on SCr of  0.55 mg/dL (L)). Liver Function Tests: Recent Labs  Lab 12/22/2019 1830 12/14/19 0539  AST 40 47*  ALT 29 27  ALKPHOS 151* 142*  BILITOT 1.7* 1.8*  PROT 5.1* 5.1*  ALBUMIN 2.6* 2.5*   No results for input(s): LIPASE, AMYLASE in the last 168 hours. Recent Labs  Lab 12/25/2019 1911 12/14/19 0539  AMMONIA 108* 74*    Recent Results (from the past 240 hour(s))  Resp Panel by RT-PCR (Flu A&B, Covid) Nasopharyngeal Swab     Status: None   Collection Time: 01/11/2020  8:35 PM   Specimen: Nasopharyngeal Swab; Nasopharyngeal(NP) swabs in vial transport medium  Result Value Ref Range Status   SARS Coronavirus 2 by RT PCR NEGATIVE NEGATIVE Final    Comment: (NOTE) SARS-CoV-2 target nucleic acids are NOT DETECTED.  The SARS-CoV-2 RNA is generally detectable in upper respiratory specimens during the acute phase of infection. The lowest concentration of SARS-CoV-2 viral copies this assay can detect is 138 copies/mL. A negative result does not preclude SARS-Cov-2 infection and should not be used as the sole basis for treatment or other patient management decisions. A negative result may occur with  improper specimen collection/handling, submission of specimen other than nasopharyngeal swab, presence of viral mutation(s) within the areas targeted by this assay, and inadequate number of viral copies(<138 copies/mL). A negative result must be combined with clinical observations, patient history, and epidemiological information. The expected result is Negative.  Fact Sheet for Patients:  EntrepreneurPulse.com.au  Fact Sheet for Healthcare Providers:  IncredibleEmployment.be  This test is no t yet approved or cleared by the [place] FDA and  has been authorized for detection and/or diagnosis of SARS-CoV-2 by FDA under an Emergency Use Authorization (EUA). This EUA will remain  in effect (meaning this test can be used) for the duration of the COVID-19  declaration under Section 564(b)(1) of the Act, 21 U.S.C.section 360bbb-3(b)(1), unless the authorization is terminated  or revoked sooner.       Influenza A by PCR NEGATIVE NEGATIVE Final   Influenza B by PCR NEGATIVE NEGATIVE Final    Comment: (NOTE) The Xpert Xpress SARS-CoV-2/FLU/RSV plus assay is intended as an aid in the diagnosis of influenza from Nasopharyngeal swab specimens and should not be used as a sole basis for treatment. Nasal washings and aspirates are unacceptable for Xpert Xpress SARS-CoV-2/FLU/RSV testing.  Fact Sheet for Patients: EntrepreneurPulse.com.au  Fact Sheet for Healthcare Providers: IncredibleEmployment.be  This test is not yet approved or cleared by the [place] FDA and has been authorized for detection and/or diagnosis of SARS-CoV-2 by FDA under an Emergency Use Authorization (EUA). This EUA will remain in effect (meaning this test can be used) for the duration of the COVID-19 declaration under Section 564(b)(1) of the Act,  21 U.S.C. section 360bbb-3(b)(1), unless the authorization is terminated or revoked.  Performed at [facility], [address], [place], [place] [address]       Imaging Studies   CT Head Wo Contrast  Result Date: 01/05/2020 CLINICAL DATA:  Seizure. Tonic colonic seizure lasting approximately 15-20 minutes. EXAM: CT HEAD WITHOUT CONTRAST TECHNIQUE: Contiguous axial images were obtained from the base of the skull through the vertex without intravenous contrast. COMPARISON:  07/11/2019 FINDINGS: Brain: No evidence of acute infarction, hemorrhage, hydrocephalus, extra-axial collection or mass lesion/mass effect. Mild prominence of the sulci and ventricles. Vascular: No hyperdense vessel or unexpected calcification. Skull: Normal. Negative for fracture or focal lesion. Sinuses/Orbits: No acute finding. Other: None. IMPRESSION: 1. No acute intracranial abnormalities. 2. Mild brain  atrophy. Electronically Signed   By: Kerby Moors M.D.   On: 12/20/2019 19:19     Medications   Scheduled Meds: . enoxaparin (LOVENOX) injection  40 mg Subcutaneous Q24H  . feeding supplement  237 mL Oral BID BM  . fluorouracil  1 application Topical Daily  . furosemide  10 mg Intravenous BID  . furosemide  40 mg Intravenous Once  . lactulose  30 g Per Tube TID  . lidocaine  1 patch Transdermal Q12H  . melatonin  10 mg Oral QHS  . multivitamin with minerals  1 tablet Oral Daily  . propranolol  20 mg Oral Daily  . rifaximin  550 mg Oral BID  . sertraline  50 mg Oral Daily  . spironolactone  50 mg Oral BID  . thiamine injection  100 mg Intravenous Daily  . traZODone  300 mg Oral QHS  . vitamin E  400 Units Oral QPM  . zonisamide  400 mg Oral QHS   Continuous Infusions: . sodium chloride 75 mL/hr (12/14/19 0141)  . folic acid (FOLVITE) IVPB    . levETIRAcetam         LOS: 1 day    Time spent: 30 minutes    Jaianna Nicoll Manuella Ghazi, DO [facility]  12/14/2019, 3:52 PM    If 7PM-7AM, please contact night-coverage. How to contact the [facility] Attending or Consulting provider [place] or covering provider during after hours [place], for this patient?    1. Check the care team in [facility] and look for a) attending/consulting [facility] provider listed and b) the [facility] team listed 2. Log into [email or web address] and use [place]'s universal password to access. If you do not have the password, please contact the hospital operator. 3. Locate the [facility] provider you are looking for under [facility] and page to a number that you can be directly reached. 4. If you still have difficulty reaching the provider, please page the [facility] (Director on Call) for the Hospitalists listed on amion for assistance.

## 2019-12-14 NOTE — ED Notes (Signed)
Pt seizing again at this time. MD Tamala Julian to bedside. 2 mg ativan ordered and given. Mansy MD aware. See new orders.

## 2019-12-14 NOTE — ED Notes (Signed)
Pt cleaned of stool and urine at this time, new chux placed under pt

## 2019-12-15 ENCOUNTER — Inpatient Hospital Stay (HOSPITAL_COMMUNITY)
Admission: AD | Admit: 2019-12-15 | Payer: Medicare HMO | Source: Other Acute Inpatient Hospital | Admitting: Internal Medicine

## 2019-12-15 ENCOUNTER — Inpatient Hospital Stay: Payer: Medicare HMO

## 2019-12-15 ENCOUNTER — Encounter: Payer: Self-pay | Admitting: Family Medicine

## 2019-12-15 ENCOUNTER — Inpatient Hospital Stay: Payer: Self-pay

## 2019-12-15 ENCOUNTER — Other Ambulatory Visit: Payer: Self-pay

## 2019-12-15 DIAGNOSIS — F1020 Alcohol dependence, uncomplicated: Secondary | ICD-10-CM

## 2019-12-15 DIAGNOSIS — K7290 Hepatic failure, unspecified without coma: Secondary | ICD-10-CM

## 2019-12-15 DIAGNOSIS — G40901 Epilepsy, unspecified, not intractable, with status epilepticus: Secondary | ICD-10-CM

## 2019-12-15 LAB — MRSA PCR SCREENING: MRSA by PCR: NEGATIVE

## 2019-12-15 LAB — CBC
HCT: 30.4 % — ABNORMAL LOW (ref 39.0–52.0)
Hemoglobin: 10.5 g/dL — ABNORMAL LOW (ref 13.0–17.0)
MCH: 36.2 pg — ABNORMAL HIGH (ref 26.0–34.0)
MCHC: 34.5 g/dL (ref 30.0–36.0)
MCV: 104.8 fL — ABNORMAL HIGH (ref 80.0–100.0)
Platelets: 109 10*3/uL — ABNORMAL LOW (ref 150–400)
RBC: 2.9 MIL/uL — ABNORMAL LOW (ref 4.22–5.81)
RDW: 15.2 % (ref 11.5–15.5)
WBC: 6.4 10*3/uL (ref 4.0–10.5)
nRBC: 0 % (ref 0.0–0.2)

## 2019-12-15 LAB — BASIC METABOLIC PANEL
Anion gap: 11 (ref 5–15)
BUN: 10 mg/dL (ref 6–20)
CO2: 21 mmol/L — ABNORMAL LOW (ref 22–32)
Calcium: 8.4 mg/dL — ABNORMAL LOW (ref 8.9–10.3)
Chloride: 110 mmol/L (ref 98–111)
Creatinine, Ser: 0.62 mg/dL (ref 0.61–1.24)
GFR, Estimated: >60 mL/min (ref >60)
Glucose, Bld: 98 mg/dL (ref 70–99)
Potassium: 3.7 mmol/L (ref 3.5–5.1)
Sodium: 142 mmol/L (ref 135–145)

## 2019-12-15 LAB — GLUCOSE, CAPILLARY
Glucose-Capillary: 96 mg/dL (ref 70–99)
Glucose-Capillary: 96 mg/dL (ref 70–99)

## 2019-12-15 LAB — AMMONIA: Ammonia: 67 umol/L — ABNORMAL HIGH (ref 9–35)

## 2019-12-15 LAB — BLOOD GAS, ARTERIAL

## 2019-12-15 LAB — MAGNESIUM
Magnesium: 1.6 mg/dL — ABNORMAL LOW (ref 1.7–2.4)
Magnesium: 2.5 mg/dL — ABNORMAL HIGH (ref 1.7–2.4)

## 2019-12-15 MED ORDER — CHLORHEXIDINE GLUCONATE CLOTH 2 % EX PADS
6.0000 | MEDICATED_PAD | Freq: Every day | CUTANEOUS | Status: DC
  Administered 2019-12-16 – 2019-12-19 (×4): 6 via TOPICAL

## 2019-12-15 MED ORDER — MIDAZOLAM 50MG/50ML (1MG/ML) PREMIX INFUSION
10.0000 mg/h | INTRAVENOUS | Status: DC
  Administered 2019-12-15 – 2019-12-18 (×12): 10 mg/h via INTRAVENOUS
  Filled 2019-12-15 (×13): qty 50

## 2019-12-15 MED ORDER — LORAZEPAM 2 MG/ML IJ SOLN: 2.0000 mg | INTRAMUSCULAR | 0 refills | Status: AC | PRN

## 2019-12-15 MED ORDER — ROCURONIUM BROMIDE 50 MG/5ML IV SOLN
INTRAVENOUS | Status: AC
  Administered 2019-12-15: 10 mg
  Filled 2019-12-15: qty 1

## 2019-12-15 MED ORDER — MAGNESIUM SULFATE 2 GM/50ML IV SOLN
2.0000 g | Freq: Once | INTRAVENOUS | Status: AC
  Administered 2019-12-15: 2 g via INTRAVENOUS
  Filled 2019-12-15: qty 50

## 2019-12-15 MED ORDER — LEVETIRACETAM IN NACL 1500 MG/100ML IV SOLN
1500.0000 mg | Freq: Two times a day (BID) | INTRAVENOUS | Status: DC
  Administered 2019-12-16 – 2019-12-18 (×7): 1500 mg via INTRAVENOUS
  Filled 2019-12-15 (×10): qty 100

## 2019-12-15 MED ORDER — RIFAXIMIN 550 MG PO TABS: 550.0000 mg | ORAL_TABLET | Freq: Two times a day (BID) | ORAL | Status: AC

## 2019-12-15 MED ORDER — IPRATROPIUM-ALBUTEROL 0.5-2.5 (3) MG/3ML IN SOLN
3.0000 mL | Freq: Four times a day (QID) | RESPIRATORY_TRACT | Status: DC
  Administered 2019-12-15 – 2019-12-18 (×12): 3 mL via RESPIRATORY_TRACT
  Filled 2019-12-15 (×12): qty 3

## 2019-12-15 MED ORDER — LORAZEPAM 2 MG/ML IJ SOLN
2.0000 mg | Freq: Four times a day (QID) | INTRAMUSCULAR | Status: AC
  Administered 2019-12-15 – 2019-12-16 (×3): 2 mg via INTRAVENOUS
  Filled 2019-12-15 (×3): qty 1

## 2019-12-15 MED ORDER — ORAL CARE MOUTH RINSE
15.0000 mL | OROMUCOSAL | Status: DC
  Administered 2019-12-15 – 2019-12-19 (×32): 15 mL via OROMUCOSAL

## 2019-12-15 MED ORDER — PANTOPRAZOLE SODIUM 40 MG IV SOLR
40.0000 mg | Freq: Two times a day (BID) | INTRAVENOUS | Status: DC
  Administered 2019-12-15 – 2019-12-18 (×8): 40 mg via INTRAVENOUS
  Filled 2019-12-15 (×8): qty 40

## 2019-12-15 MED ORDER — LORAZEPAM 2 MG/ML IJ SOLN
1.0000 mg | Freq: Four times a day (QID) | INTRAMUSCULAR | Status: AC
  Administered 2019-12-16 – 2019-12-18 (×8): 1 mg via INTRAVENOUS
  Filled 2019-12-15 (×8): qty 1

## 2019-12-15 MED ORDER — ETOMIDATE 2 MG/ML IV SOLN
INTRAVENOUS | Status: AC
  Administered 2019-12-15: 20 mg
  Filled 2019-12-15: qty 20

## 2019-12-15 MED ORDER — LORAZEPAM 2 MG/ML IJ SOLN: 2.0000 mg | Freq: Four times a day (QID) | INTRAMUSCULAR | 0 refills | Status: AC

## 2019-12-15 MED ORDER — LACTULOSE 10 GM/15ML PO SOLN: 30.0000 g | Freq: Three times a day (TID) | ORAL | 0 refills | Status: AC

## 2019-12-15 MED ORDER — MIDAZOLAM HCL 2 MG/2ML IJ SOLN
INTRAMUSCULAR | Status: AC
  Administered 2019-12-15: 2 mg
  Filled 2019-12-15: qty 2

## 2019-12-15 MED ORDER — LEVETIRACETAM IN NACL 500 MG/100ML IV SOLN
500.0000 mg | Freq: Once | INTRAVENOUS | Status: AC
  Administered 2019-12-15: 500 mg via INTRAVENOUS
  Filled 2019-12-15: qty 100

## 2019-12-15 MED ORDER — CHLORHEXIDINE GLUCONATE 0.12% ORAL RINSE (MEDLINE KIT)
15.0000 mL | Freq: Two times a day (BID) | OROMUCOSAL | Status: DC
  Administered 2019-12-15 – 2019-12-18 (×7): 15 mL via OROMUCOSAL

## 2019-12-15 MED ORDER — FENTANYL CITRATE (PF) 100 MCG/2ML IJ SOLN
50.0000 ug | INTRAMUSCULAR | Status: DC | PRN
  Administered 2019-12-16 – 2019-12-18 (×6): 50 ug via INTRAVENOUS
  Filled 2019-12-15 (×5): qty 2

## 2019-12-15 MED ORDER — LORAZEPAM 2 MG/ML IJ SOLN
1.0000 mg | Freq: Three times a day (TID) | INTRAMUSCULAR | Status: DC
  Administered 2019-12-15: 1 mg via INTRAVENOUS
  Filled 2019-12-15: qty 1

## 2019-12-15 NOTE — Procedures (Signed)
Endotracheal Intubation: Patient required placement of an artificial airway secondary to need for airway protection due to status epilepticus need for continuous sedative infusion.  Consent: Emergent.   Hand washing performed prior to starting the procedure.   Medications administered for sedation prior to procedure:  Midazolam 2 mg IV, rocuronium 50 mg IV, etomidate 30 IV.   A time out procedure was called and correct patient, name, & ID confirmed. Needed supplies and equipment were assembled and checked to include ETT, 10 ml syringe, Glidescope, Mac and Miller blades, suction, oxygen and bag mask valve, end tidal CO2 monitor.   Patient edentulous.  Airway class II  Patient was positioned to align the mouth and pharynx to facilitate visualization of the glottis.   Heart rate, SpO2 and blood pressure was continuously monitored during the procedure. Pre-oxygenation was conducted prior to intubation.  Visualization via glidescope (S4 blade) with good airway view a 8.5 ETTwas placed through the vocal cords into the trachea  on the first attempt.  ETT was secured at 25 cm mark.  Placement was confirmed by auscuitation of lungs with good breath sounds bilaterally and no epigastric sounds.  Condensation was noted on endotracheal tube.  Pulse ox 100 %.  CO2 detector in place with appropriate color change.   Complications: None .   Operator: Patsey Berthold  Chest Xray: Endotracheal tube in satisfactory position, no evidence of complication.  Vascular congestion.  Patient tolerated well.   Renold Don, MD [facility]   *This note was dictated using voice recognition software/Dragon.  Despite best efforts to proofread, errors can occur which can change the meaning.  Any change was purely unintentional.

## 2019-12-15 NOTE — Treatment Plan (Signed)
Report given to [facility] in ICU at this time.

## 2019-12-15 NOTE — Progress Notes (Signed)
The patient has had continued seizure activity intermittently while on scheduled Ativan. Repeat exam reveals an obtunded patient that murmurs 1-2 word answers to questions but otherwise makes no eye contact and does not follow commands.   Supplemental load of IV Keppra 500 mg has been ordered. Scheduled dosing increased to 1500 mg IV BID. Did not receive Zonegran per tube yesterday due to active GI bleeding.   Will need LTM EEG monitoring. May need to be intubated and placed in burst suppression based on LTM EEG. This will require transfer to a higher level of care at [facility]. If burst suppression is needed, then Versed would most likely be the best choice for IV sedation as he is most likely in EtOH withdrawal.   Discussed with Dr. Manuella Ghazi who has arranged for transfer to the ICU at [facility] under the management of CCM. Dr. Rory Percy of Neurology at [facility] has been notified.   Electronically signed: Dr. Kerney Elbe

## 2019-12-15 NOTE — Progress Notes (Signed)
Pts wife tells this RN that she does not want pt to be transferred to [facility], but would rather him be transferred to [facility]. Will let provider know.

## 2019-12-15 NOTE — Treatment Plan (Signed)
Pt had another mild myoclonic jerk lasting 36 seconds at this time, started with mild hand jerking and became more aggressive then stopped, VSS< MD Manuella Ghazi made aware.

## 2019-12-15 NOTE — Progress Notes (Signed)
Per Neuro, he will need to go to [facility] for continuous EEG monitoring as he's still having more seizures.  No beds available at [facility] yet so will transfer him to our ICU in the interim.

## 2019-12-15 NOTE — Progress Notes (Signed)
As patient is intubated now, will transfer service to PCCM.

## 2019-12-15 NOTE — Progress Notes (Signed)
   12/15/19 1313  Vitals  BP 139/70  MAP (mmHg) 91  BP Location Right Arm  BP Method Automatic  Patient Position (if appropriate) Lying  Pulse Rate 66  Pulse Rate Source Dinamap  Resp (!) 22  Level of Consciousness  Level of Consciousness Alert  MEWS COLOR  MEWS Score Color Green  Oxygen Therapy  SpO2 93 %  O2 Device Room Air  MEWS Score  MEWS Temp 0  MEWS Systolic 0  MEWS Pulse 0  MEWS RR 1  MEWS LOC 0  MEWS Score 1  Post seizure vitals

## 2019-12-15 NOTE — Treatment Plan (Signed)
Per MD Manuella Ghazi, called wife to inform of transfer, LMOVM at this time with callback number for wife to call.
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
## 2019-12-15 NOTE — Treatment Plan (Addendum)
Wife Bethena Roys called at this time and notified of transfer to room 9 and informed her that patient will be transferred to [facility] when a bed becomes available.

## 2019-12-15 NOTE — Progress Notes (Signed)
Notified NP that pt just had a seizure. It last for about one minute. Pt given PRN 2mg  of ativan. Pt now resting, VS stable. Will continue to monitor.

## 2019-12-15 NOTE — Discharge Summary (Signed)
[facility] at [place] NAME: Adrian Hale    MR#:  [id number]  DATE OF BIRTH:  1960-07-31  DATE OF ADMISSION:  12/20/2019   ADMITTING PHYSICIAN: Christel Mormon, MD  DATE OF DISCHARGE: 12/15/2019  PRIMARY CARE PHYSICIAN: [place], [facility]   ADMISSION DIAGNOSIS:  Hepatic encephalopathy ([facility]) [K72.90] Seizure disorder (Ad[facility]) [H85.277] DISCHARGE DIAGNOSIS:  Active Problems:   Hepatic encephalopathy (HCC)   Alcohol use disorder, severe, dependence ([place])  SECONDARY DIAGNOSIS:   Past Medical History:  Diagnosis Date  . ALC (alcoholic liver cirrhosis) ([place])   . Alcohol abuse    NONE SINCE 08/28/2011 AND ATTENDING AA MEETINGS  . Alcoholic cirrhosis of liver ([place])   . Alcoholic peripheral neuropathy ([place])   . Allergy to poison ivy 06/06/2013   RASH  . Anxiety   . Aortic aneurysm ([place])   . Cellulitis of both lower extremities   . Complex partial seizure evolving to generalized seizure ([place])   . COPD (chronic obstructive pulmonary disease) ([place])   . Depression    MAJOR DEPRESSIVE DISORDER  . ED (erectile dysfunction)   . Hepatic encephalopathy ([facility])   . Hyperlipidemia   . Hypertension    ESSENTIAL  . Maculopathy   . Marijuana abuse   . MRSA (methicillin resistant staph aureus) culture positive   . Obesity   . Portal hypertension ([place])   . Pseudophakia of both eyes   . Ptosis of both eyelids   . S/P TIPS (transjugular intrahepatic portosystemic shunt)   . Seizures (Adrian [facility])    Last seizure April 06, 2013, Adrian Hale  . Seizures [facility])   . Sleep apnea    Does not use CPAP  . Thrombocytopenia, secondary   . Tobacco abuse   . Toxic maculopathy of right eye   . Ulcer of lower limb (Adrian Hale)    ABSCESS OR CELLULITIS OF LEG   HOSPITAL COURSE:  59 y.o.Caucasian malewith a known history ofalcoholic liver cirrhosis, COPD, hepatic encephalopathy, dyslipidemia, hypertension alcohol and marijuana abuse, admitted for acute onset of seizures that  lasted about 15 minutes followed by postictal confusion and altered mental status.  Recurrent seizures -breakthrough seizures -Patient continues to have more seizures despite Ativan and Keppra.  Neurology recommends transfer to [facility] for continuous EEG monitoring and he may need to be placed in burst suppression,.  Patient has been accepted at [facility] by Dr. Linna Hoff Smith/CCM but currently there are no beds available.  -Considering his ongoing seizure and no beds at [facility] I have requested for him to be transferred to our ICU and discussed with Dr. Patsey Berthold.  I have requested stat consult he will need to be in the ICU team as neurology requested intubation and Versed drip. -Nursing reported some bloody secretions in the NG tube.  I have stopped Lovenox and switch him over to SCDs for now  2. Acute hepatic encephalopathy. This likely secondary to noncompliance with lactulose. Continue lactulose and rifaximin. ammonia level trending down.  74->67  3. Depression. continue Zoloft and trazodone. Psychiatry consult as wife reports binge drinking and abuse at home.  4. Polysubstance abuse including alcohol abuse and marijuana abuse. Continue CIWA protocol  Patient remains critically sick and high risk for acute cardiorespiratory failure and death with recurrent seizure.  Until he obtains better Adrian Hale, ICU he will need to be closely monitored in ICU and likely intubated being on Versed drip.   DISCHARGE CONDITIONS:  Critical CONSULTS OBTAINED:  Treatment Team:  Pccm, Md, MD DRUG ALLERGIES:   Allergies  Allergen Reactions  . Poison Ivy Extract   . Poison [employer]   . Poison Ivy Extract [Poison Ivy Extract] Rash   DISCHARGE MEDICATIONS:   Allergies as of 12/15/2019      Reactions   Poison [employer] Extract    Poison [employer] Ivy Extract [poison Ivy Extract] Rash      Medication List    STOP taking these medications   acetaminophen 325 MG tablet Commonly known  as: TYLENOL   albuterol 108 (90 Base) MCG/ACT inhaler Commonly known as: VENTOLIN HFA   feeding supplement Liqd   fluorouracil 5 % cream Commonly known as: EFUDEX   fluticasone 50 MCG/ACT nasal spray Commonly known as: FLONASE   folic acid 1 MG tablet Commonly known as: FOLVITE   furosemide 20 MG tablet Commonly known as: LASIX   ketoconazole 2 % cream Commonly known as: NIZORAL   levETIRAcetam 750 MG tablet Commonly known as: KEPPRA   lidocaine 5 % Commonly known as: Lidoderm   Melatonin 10 MG Tabs   Milk Thistle 500 MG Caps   multivitamin with minerals Tabs tablet   propranolol 20 MG tablet Commonly known as: INDERAL   sertraline 50 MG tablet Commonly known as: ZOLOFT   spironolactone 50 MG tablet Commonly known as: ALDACTONE   tadalafil 20 MG tablet Commonly known as: CIALIS   thiamine 100 MG tablet   tiZANidine 2 MG tablet Commonly known as: ZANAFLEX   traMADol 50 MG tablet Commonly known as: ULTRAM   traZODone 100 MG tablet Commonly known as: DESYREL   vitamin E 180 MG (400 UNITS) capsule Generic drug: vitamin E   zonisamide 100 MG capsule Commonly known as: ZONEGRAN     TAKE these medications   lactulose 10 GM/15ML solution Commonly known as: CHRONULAC Place 45 mLs (30 g total) into feeding tube 3 (three) times daily. What changed:  how to take this when to take this additional instructions   LORazepam 2 MG/ML injection Commonly known as: ATIVAN Inject 1 mL (2 mg total) into the vein every 30 (thirty) minutes as needed for seizure (Alcohol withdrawal).   LORazepam 2 MG/ML injection Commonly known as: ATIVAN Inject 1 mL (2 mg total) into the vein every 6 (six) hours.   rifaximin 550 MG Tabs tablet Commonly known as: XIFAXAN Place 1 tablet (550 mg total) into feeding tube 2 (two) times daily. What changed: how to take this      DISCHARGE INSTRUCTIONS:   DIET:  NPO DISCHARGE CONDITION:  Critical ACTIVITY:  Activity as  tolerated OXYGEN:  Home Oxygen: No.  Oxygen Delivery: room air DISCHARGE LOCATION:  [facility]   If you experience worsening of your admission symptoms, develop shortness of breath, life threatening emergency, suicidal or homicidal thoughts you must seek medical attention immediately by calling 911 or calling your MD immediately  if symptoms less severe.  You Must read complete instructions/literature along with all the possible adverse reactions/side effects for all the Medicines you take and that have been prescribed to you. Take any new Medicines after you have completely understood and accpet all the possible adverse reactions/side effects.   Please note  You were cared for by a hospitalist during your hospital stay. If you have any questions about your discharge medications or the care you received while you were in the hospital after you are discharged, you can call the unit and asked to speak with the hospitalist  on call if the hospitalist that took care of you is not available. Once you are discharged, your primary care physician will handle any further medical issues. Please note that NO REFILLS for any discharge medications will be authorized once you are discharged, as it is imperative that you return to your primary care physician (or establish a relationship with a primary care physician if you do not have one) for your aftercare needs so that they can reassess your need for medications and monitor your lab values.    On the day of Discharge:  VITAL SIGNS:  Blood pressure (!) 142/89, pulse 63, temperature 98.9 F (37.2 C), temperature source Axillary, resp. rate (!) 21, height 6' (1.829 m), weight 61.2 kg, SpO2 92 %. PHYSICAL EXAMINATION:  GENERAL:  59 y.o.-year-old patient lying in the bed  Patient is post ictal  NGT showing bloody secretion DATA REVIEW:   CBC Recent Labs  Lab 12/15/19 0440  WBC 6.4  HGB 10.5*  HCT 30.4*  PLT 109*    Chemistries  Recent Labs  Lab  12/14/19 0539 12/14/19 0539 12/15/19 0440 12/15/19 0449  NA 138   < > 142  --   K 4.1   < > 3.7  --   CL 109   < > 110  --   CO2 21*   < > 21*  --   GLUCOSE 97   < > 98  --   BUN 8   < > 10  --   CREATININE 0.55*   < > 0.62  --   CALCIUM 8.3*   < > 8.4*  --   MG 1.8   < >  --  1.6*  AST 47*  --   --   --   ALT 27  --   --   --   ALKPHOS 142*  --   --   --   BILITOT 1.8*  --   --   --    < > = values in this interval not displayed.     Outpatient follow-up    Management plans discussed with the patient, nursing, (wife didn't pick up the message) and they are in agreement.  CODE STATUS: Full Code   TOTAL TIME TAKING CARE OF THIS PATIENT: 45 minutes.    Max Sane M.D on 12/15/2019 at 6:15 PM  [facility]   CC: Primary care physician; [facility]   Note: This dictation was prepared with Dragon dictation along with smaller phrase technology. Any transcriptional errors that result from this process are unintentional.

## 2019-12-15 NOTE — Progress Notes (Signed)
PROGRESS NOTE    Adrian Hale   ZDG:[id number]  DOB: March 20, 1960  PCP: [facility]    DOA: 01/08/2020 LOS: 2   Brief Narrative  59 y.o. Caucasian male with a known history of alcoholic liver cirrhosis, COPD, hepatic encephalopathy, dyslipidemia, hypertension alcohol and marijuana abuse, who presented to the emergency room with acute onset of seizures that lasted about 15 minutes followed by postictal confusion and altered mental status.  All information is taken from history and physical dictated by admitting hospitalist. Assessment & Plan   Active Problems:   Hepatic encephalopathy (HCC)  Recurrent seizures -breakthrough seizures - another episode of seizure last night requiring Ativan -Seizure precaution -Stopping Ultram and Dilantin -Continue Keppra and Zonegran per neurology and adjust dose as needed -I have ordered scheduled dose of Ativan IV 1 mg every 8 hour per neurology recommendation.  This will need to be tapered slowly -Nursing reported some bloody secretions in the NG tube.  I have stopped Lovenox and switch him over to SCDs for now  2.  Acute hepatic encephalopathy. -This likely secondary to noncompliance with lactulose. -Continue lactulose and rifaximin. -ammonia level trending down.  74->67  3.  Depression. - continue Zoloft and trazodone. - Psychiatry consult.  I have sent a secure chat to Dr. Alonna Minium  4.  Polysubstance abuse including alcohol abuse and marijuana abuse. -He was counseled for cessation. -Continue CIWA protocol  Alcoholic liver cirrhosis - chronic  COPD - Stable  DVT prophylaxis: Place and maintain sequential compression device Start: 12/15/19 1435   Diet:  Diet Orders (From admission, onward)    Start     Ordered   12/14/19 2118  Diet NPO time specified  Diet effective now        12/14/19 2117            Code Status: Full Code    Subjective 12/15/19   Had seizure overnight requiring IV Ativan.  Sleepy  now. Disposition Plan & Communication   Status is: Inpatient  Remains inpatient appropriate because:Ongoing diagnostic testing needed not appropriate for outpatient work up   Dispo: The patient is from: Home              Anticipated d/c is to: Home              Anticipated d/c date is: 3 days              Patient currently is not medically stable to d/c.  Will need to monitor him for seizure and clinical improvement.   Family Communication: none at bedside, patient asked not to speak to his spouse on last admission  Consults, Procedures, Significant Events   Consultants:   Neurology   Objective   Vitals:   12/14/19 2319 12/15/19 0446 12/15/19 0728 12/15/19 1121  BP: 140/72 128/65 129/81 133/71  Pulse: (!) 59 67 70 63  Resp: 18 20 19 16   Temp: 97.7 F (36.5 C) 98.6 F (37 C) 98.7 F (37.1 C) 97.7 F (36.5 C)  TempSrc: Oral Oral Oral   SpO2: 97% 95% 95% 95%  Weight:      Height:        Intake/Output Summary (Last 24 hours) at 12/15/2019 1434 Last data filed at 12/15/2019 1300 Gross per 24 hour  Intake 2440.22 ml  Output 2400 ml  Net 40.22 ml   Filed Weights   01/05/2020 1829  Weight: 61.2 kg    Physical Exam:  General exam: Sleepy and lethargic, no  acute distress, chronically ill appearing.  NG tube in place Respiratory system: CTAB, shallow inspirations due to rib pain, no wheezes, rales or rhonchi, normal respiratory effort. Cardiovascular system: normal S1/S2, RRR, no pedal edema.   Gastrointestinal system: soft, mildly tender on right, ND Central nervous system: Sleepy and lethargic.  Moving all extremities voluntarily Extremities: moves all, normal tone, no edema Psychiatry: Difficult to evaluate considering his mental status   Labs   Data Reviewed: I have personally reviewed following labs and imaging studies  CBC: Recent Labs  Lab 01/04/2020 1830 12/14/19 0737 12/15/19 0440  WBC 3.8* 4.5 6.4  NEUTROABS 2.3  --   --   HGB 9.8* 10.0* 10.5*  HCT  26.9* 28.8* 30.4*  MCV 101.9* 104.7* 104.8*  PLT 98* 99* 403*   Basic Metabolic Panel: Recent Labs  Lab 12/26/2019 1830 12/14/19 0539 12/15/19 0440 12/15/19 0449  NA 137 138 142  --   K 3.5 4.1 3.7  --   CL 105 109 110  --   CO2 25 21* 21*  --   GLUCOSE 97 97 98  --   BUN 10 8 10   --   CREATININE 0.67 0.55* 0.62  --   CALCIUM 8.3* 8.3* 8.4*  --   MG  --  1.8  --  1.6*   GFR: Estimated Creatinine Clearance: 85 mL/min (by C-G formula based on SCr of 0.62 mg/dL). Liver Function Tests: Recent Labs  Lab 12/27/2019 1830 12/14/19 0539  AST 40 47*  ALT 29 27  ALKPHOS 151* 142*  BILITOT 1.7* 1.8*  PROT 5.1* 5.1*  ALBUMIN 2.6* 2.5*   No results for input(s): LIPASE, AMYLASE in the last 168 hours. Recent Labs  Lab 01/06/2020 1911 12/14/19 0539 12/15/19 0440  AMMONIA 108* 74* 67*    Recent Results (from the past 240 hour(s))  Resp Panel by RT-PCR (Flu A&B, Covid) Nasopharyngeal Swab     Status: None   Collection Time: 01/02/2020  8:35 PM   Specimen: Nasopharyngeal Swab; Nasopharyngeal(NP) swabs in vial transport medium  Result Value Ref Range Status   SARS Coronavirus 2 by RT PCR NEGATIVE NEGATIVE Final    Comment: (NOTE) SARS-CoV-2 target nucleic acids are NOT DETECTED.  The SARS-CoV-2 RNA is generally detectable in upper respiratory specimens during the acute phase of infection. The lowest concentration of SARS-CoV-2 viral copies this assay can detect is 138 copies/mL. A negative result does not preclude SARS-Cov-2 infection and should not be used as the sole basis for treatment or other patient management decisions. A negative result may occur with  improper specimen collection/handling, submission of specimen other than nasopharyngeal swab, presence of viral mutation(s) within the areas targeted by this assay, and inadequate number of viral copies(<138 copies/mL). A negative result must be combined with clinical observations, patient history, and  epidemiological information. The expected result is Negative.  Fact Sheet for Patients:  EntrepreneurPulse.com.au  Fact Sheet for Healthcare Providers:  IncredibleEmployment.be  This test is no t yet approved or cleared by the [place] FDA and  has been authorized for detection and/or diagnosis of SARS-CoV-2 by FDA under an Emergency Use Authorization (EUA). This EUA will remain  in effect (meaning this test can be used) for the duration of the COVID-19 declaration under Section 564(b)(1) of the Act, 21 U.S.C.section 360bbb-3(b)(1), unless the authorization is terminated  or revoked sooner.       Influenza A by PCR NEGATIVE NEGATIVE Final   Influenza B by PCR NEGATIVE NEGATIVE Final  Comment: (NOTE) The Xpert Xpress SARS-CoV-2/FLU/RSV plus assay is intended as an aid in the diagnosis of influenza from Nasopharyngeal swab specimens and should not be used as a sole basis for treatment. Nasal washings and aspirates are unacceptable for Xpert Xpress SARS-CoV-2/FLU/RSV testing.  Fact Sheet for Patients: EntrepreneurPulse.com.au  Fact Sheet for Healthcare Providers: IncredibleEmployment.be  This test is not yet approved or cleared by the [place] FDA and has been authorized for detection and/or diagnosis of SARS-CoV-2 by FDA under an Emergency Use Authorization (EUA). This EUA will remain in effect (meaning this test can be used) for the duration of the COVID-19 declaration under Section 564(b)(1) of the Act, 21 U.S.C. section 360bbb-3(b)(1), unless the authorization is terminated or revoked.  Performed at [facility], [place], [place] [address]       Imaging Studies   DG Abdomen 1 View  Result Date: 12/14/2019 CLINICAL DATA:  NG tube placement EXAM: ABDOMEN - 1 VIEW COMPARISON:  None. FINDINGS: The bowel gas pattern is normal. NG tube tip is seen within the  proximal stomach. Side hole seen within the distal GE junction. No radio-opaque calculi or other significant radiographic abnormality are seen. IMPRESSION: NG tube tip seen just entering the proximal stomach and could be advanced several cm. Electronically Signed   By: Prudencio Pair M.D.   On: 12/14/2019 19:46   CT Head Wo Contrast  Result Date: 12/20/2019 CLINICAL DATA:  Seizure. Tonic colonic seizure lasting approximately 15-20 minutes. EXAM: CT HEAD WITHOUT CONTRAST TECHNIQUE: Contiguous axial images were obtained from the base of the skull through the vertex without intravenous contrast. COMPARISON:  07/11/2019 FINDINGS: Brain: No evidence of acute infarction, hemorrhage, hydrocephalus, extra-axial collection or mass lesion/mass effect. Mild prominence of the sulci and ventricles. Vascular: No hyperdense vessel or unexpected calcification. Skull: Normal. Negative for fracture or focal lesion. Sinuses/Orbits: No acute finding. Other: None. IMPRESSION: 1. No acute intracranial abnormalities. 2. Mild brain atrophy. Electronically Signed   By: Kerby Moors M.D.   On: 12/24/2019 19:19     Medications   Scheduled Meds: . feeding supplement  237 mL Per Tube BID BM  . fluorouracil  1 application Topical Daily  . furosemide  10 mg Intravenous BID  . furosemide  40 mg Intravenous Once  . lactulose  30 g Per Tube TID  . lidocaine  1 patch Transdermal Q12H  . melatonin  10 mg Per Tube QHS  . multivitamin with minerals  1 tablet Per Tube Daily  . pantoprazole (PROTONIX) IV  40 mg Intravenous Q12H  . propranolol  20 mg Per Tube Daily  . rifaximin  550 mg Per Tube BID  . sertraline  50 mg Per Tube Daily  . spironolactone  50 mg Per Tube BID  . thiamine injection  100 mg Intravenous Daily  . traZODone  300 mg Per Tube QHS  . vitamin E  400 Units Per Tube QPM  . zonisamide  400 mg Oral QHS   Continuous Infusions: . folic acid (FOLVITE) IVPB    . levETIRAcetam Stopped (12/15/19 1152)        LOS: 2 days    Time spent: 30 minutes    Magdeline Prange Manuella Ghazi, DO [facility]  12/15/2019, 2:34 PM    If 7PM-7AM, please contact night-coverage. How to contact the [facility] Attending or Consulting provider [place] or covering provider during after hours [place], for this patient?    1. Check the care team in [facility] and look  for a) attending/consulting [place] provider listed and b) the [facility] team listed 2. Log into [email or web address] and use [place]'s universal password to access. If you do not have the password, please contact the hospital operator. 3. Locate the [facility] provider you are looking for under [facility] and page to a number that you can be directly reached. 4. If you still have difficulty reaching the provider, please page the [facility] (Director on Call) for the Hospitalists listed on amion for assistance.

## 2019-12-15 NOTE — Treatment Plan (Signed)
Pt just had 30 second seizure as this RN was in room and patient started to shake in all 4 extremities. Myoclonic seizure noted upon assessment watching patient seize. Pt was given scheduled ativan and started to seize. Pt eyes deviated to top of head.  Additional 2mg  ativan given per order. MD Manuella Ghazi made aware via secure chat.

## 2019-12-15 NOTE — Progress Notes (Signed)
[employer] Nurse reports seizure activity of short duration. Responded to 2 mg IV ativan immediately Seizure activity was described as upper body shaking.    NGT drainage appearing blood tinged. Patient started on IV protonix BID.  CBC currently pending  IV fluids discontinued given cirhosis and need for diuresis. Mag level added to pending labs

## 2019-12-15 NOTE — Care Plan (Addendum)
Pt sleepy this shift. Pt woke up briefly rubbing his nose and arm when writer was in the room, stated his name and wifes name. Re oriented patient to [facility]. Pt had seizure at 1513, myoclonic (see note) ativan given. Pt then back asleep on ativan given. Pupils are round and reactive to light. NG tube in place.   Pt has had mild jerking lasting seconds about every 10 minutes for the last hour and has had mild second tremors throughout the day.   Patient had tremors lasting 12 seconds at 1705, MD [place] brought to bedside to eval. Pt responded by grunting at Probation officer and MD at this time.

## 2019-12-15 NOTE — Consult Note (Signed)
Reason for Consult: Status epilepticus and hepatic encephalopathy needing intubation for airway protection Referring Physician: Max Sane, MD  Adrian Hale is an 59 y.o. male.  HPI:  Please note that the history is obtained from discussion with Dr. Brigitte Pulse, review of available records and discussion with the patient's wife.  Patient cannot provide history due to encephalopathy and status epilepticus.  Patient is a 59 year old current smoker (tobacco/marijuana) with a history of alcoholic cirrhosis, portal hypertension (status post TIPS), seizure disorder and other history as below who presented to [facility] on end of December because of breakthrough seizures.  He was noted to be altered after his seizures.  He was admitted for management of his seizures as well as for management of hepatic encephalopathy.  Per the patient's wife he went on an alcoholic binge starting approximately 2 to 3 weeks ago and ending approximately a week ago.  She states that the patient has had to be intubated previously for management of his seizures most recently in December 2020 and one other occasion prior to that which she cannot remember.  Per the patient's wife the patient has been followed at [facility] for "liver cancer".  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  Initial imaging here consistent a CT head which did not show acute abnormality.  Today the patient was noted to have repeated episodes of seizures and could not be controlled.  Was evaluated by neurology who recommended that the patient be intubated for midazolam infusion.  Recommendation was also to get continuous EEG.  The patient would have to go to [place] for continuous EEG.  Apparently transfer has been accepted by the PCCM team at [facility] however, no beds are available at present.  The patient was brought to the intensive care unit and was  noted to have continuous jerking of his right upper extremity and was clearly encephalopathic.  We proceeded to intubate the patient emergently.  Past Medical History:  Diagnosis Date  . ALC (alcoholic liver cirrhosis) ([place])   . Alcohol abuse    NONE SINCE 08/28/2011 AND ATTENDING AA MEETINGS  . Alcoholic cirrhosis of liver ([place])   . Alcoholic peripheral neuropathy ([place])   . Allergy to poison ivy 06/06/2013   RASH  . Anxiety   . Aortic aneurysm ([place])   . Cellulitis of both lower extremities   . Complex partial seizure evolving to generalized seizure ([place])   . COPD (chronic obstructive pulmonary disease) ([place])   . Depression    MAJOR DEPRESSIVE DISORDER  . ED (erectile dysfunction)   . Hepatic encephalopathy ([place])   . Hepatocellular carcinoma ([place])    Metastases to adrenal/lungs  . Hyperlipidemia   . Hypertension    ESSENTIAL  . Maculopathy   . Marijuana abuse   . MRSA (methicillin resistant staph aureus) culture positive   . Obesity   . Portal hypertension ([place])   . Pseudophakia of both eyes   . Ptosis of both eyelids   . S/P TIPS (transjugular intrahepatic portosystemic shunt)   . Seizures ([place])    Last seizure April 06, 2013, [facility]  . Seizures [facility])   . Sleep apnea    Does not use CPAP  . Thrombocytopenia, secondary   . Tobacco abuse   . Toxic maculopathy of right eye   . Ulcer of lower limb (HCC)    ABSCESS OR CELLULITIS OF LEG  Past Surgical History:  Procedure Laterality Date  . Biospy of adrenal Left 10/2019  . BREAST SURGERY     BIOPSY  . CATARACT EXTRACTION Right 10/19/2011  . CATARACT EXTRACTION Left 02/22/2012  . COLONOSCOPY    . ESOPHAGOGASTRODUODENOSCOPY    . EYE SURGERY    . [place] Left 10/30/2014   Procedure: HALLUX VALGUS AUSTIN;  Surgeon: Samara Deist, DPM;  Location: [facility] ORS;  Service: Podiatry;  Laterality: Left;  . LIVER SURGERY     TIPS PROCEDURE  . None    . TIPS PROCEDURE      Family History  Problem  Relation Age of Onset  . Hypertension Mother   . Hypertension Father    Social History   Tobacco Use  . Smoking status: Current Every Day Smoker    Packs/day: 0.50    Years: 15.00    Pack years: 7.50    Types: Cigarettes  . Smokeless tobacco: Never Used  Substance Use Topics  . Alcohol use: Yes    Comment: Heavy alcohol binges frequently   Heavy marijuana smoker  Allergies:  Allergies  Allergen Reactions  . Poison Ivy Extract   . Poison [employer]   . Poison Ivy Extract [Poison Ivy Extract] Rash    Scheduled Meds: . chlorhexidine gluconate (MEDLINE KIT)  15 mL Mouth Rinse BID  . [START ON 12/16/2019] Chlorhexidine Gluconate Cloth  6 each Topical Q0600  . feeding supplement  237 mL Per Tube BID BM  . furosemide  10 mg Intravenous BID  . furosemide  40 mg Intravenous Once  . ipratropium-albuterol  3 mL Nebulization Q6H  . lactulose  30 g Per Tube TID  . [START ON 12/16/2019] LORazepam  1 mg Intravenous Q6H  . LORazepam  2 mg Intravenous Q6H  . mouth rinse  15 mL Mouth Rinse 10 times per day  . multivitamin with minerals  1 tablet Per Tube Daily  . pantoprazole (PROTONIX) IV  40 mg Intravenous Q12H  . propranolol  20 mg Per Tube Daily  . rifaximin  550 mg Per Tube BID  . sertraline  50 mg Per Tube Daily  . spironolactone  50 mg Per Tube BID  . vitamin E  400 Units Per Tube QPM  . zonisamide  400 mg Oral QHS   Continuous Infusions: . folic acid (FOLVITE) IVPB    . levETIRAcetam    . midazolam 10 mg/hr (12/15/19 1849)   PRN Meds:.acetaminophen **OR** acetaminophen, albuterol, fentaNYL (SUBLIMAZE) injection, LORazepam, magnesium hydroxide, [DISCONTINUED] ondansetron **OR** ondansetron (ZOFRAN) IV, tiZANidine   Results for orders placed or performed during the hospital encounter of 01/07/2020 (from the past 48 hour(s))  Comprehensive metabolic panel     Status: Abnormal   Collection Time: 12/14/19  5:39 AM  Result Value Ref Range   Sodium 138 135 - 145 mmol/L    Potassium 4.1 3.5 - 5.1 mmol/L    Comment: HEMOLYSIS AT THIS LEVEL MAY AFFECT RESULT   Chloride 109 98 - 111 mmol/L   CO2 21 (L) 22 - 32 mmol/L   Glucose, Bld 97 70 - 99 mg/dL    Comment: Glucose reference range applies only to samples taken after fasting for at least 8 hours.   BUN 8 6 - 20 mg/dL   Creatinine, Ser 0.55 (L) 0.61 - 1.24 mg/dL   Calcium 8.3 (L) 8.9 - 10.3 mg/dL   Total Protein 5.1 (L) 6.5 - 8.1 g/dL   Albumin 2.5 (L) 3.5 - 5.0 g/dL  AST 47 (H) 15 - 41 U/L   ALT 27 0 - 44 U/L   Alkaline Phosphatase 142 (H) 38 - 126 U/L   Total Bilirubin 1.8 (H) 0.3 - 1.2 mg/dL   GFR, Estimated >60 >60 mL/min    Comment: (NOTE) Calculated using the CKD-EPI Creatinine Equation (2021)    Anion gap 8 5 - 15    Comment: Performed at [facility], [address] [place], [place] [address]  Magnesium     Status: None   Collection Time: 12/14/19  5:39 AM  Result Value Ref Range   Magnesium 1.8 1.7 - 2.4 mg/dL    Comment: Performed at [facility], [address] [place], [place] [address]  Ammonia     Status: Abnormal   Collection Time: 12/14/19  5:39 AM  Result Value Ref Range   Ammonia 74 (H) 9 - 35 umol/L    Comment: HEMOLYSIS AT THIS LEVEL MAY AFFECT RESULT Performed at [facility], [address] [place], [place] [address]   CBC     Status: Abnormal   Collection Time: 12/14/19  7:37 AM  Result Value Ref Range   WBC 4.5 4.0 - 10.5 K/uL   RBC 2.75 (L) 4.22 - 5.81 MIL/uL   Hemoglobin 10.0 (L) 13.0 - 17.0 g/dL   HCT 28.8 (L) 39 - 52 %   MCV 104.7 (H) 80.0 - 100.0 fL   MCH 36.4 (H) 26.0 - 34.0 pg   MCHC 34.7 30.0 - 36.0 g/dL   RDW 15.1 11.5 - 15.5 %   Platelets 99 (L) 150 - 400 K/uL    Comment: Immature Platelet Fraction may be clinically indicated, consider ordering this additional test [id number]    nRBC 0.0 0.0 - 0.2 %    Comment: Performed at [facility], [address] [place], [place] [address]  CBC     Status: Abnormal    Collection Time: 12/15/19  4:40 AM  Result Value Ref Range   WBC 6.4 4.0 - 10.5 K/uL   RBC 2.90 (L) 4.22 - 5.81 MIL/uL   Hemoglobin 10.5 (L) 13.0 - 17.0 g/dL   HCT 30.4 (L) 39 - 52 %   MCV 104.8 (H) 80.0 - 100.0 fL   MCH 36.2 (H) 26.0 - 34.0 pg   MCHC 34.5 30.0 - 36.0 g/dL   RDW 15.2 11.5 - 15.5 %   Platelets 109 (L) 150 - 400 K/uL    Comment: Immature Platelet Fraction may be clinically indicated, consider ordering this additional test [id number]    nRBC 0.0 0.0 - 0.2 %    Comment: Performed at [facility], [address] [place], [place] [address]  Ammonia     Status: Abnormal   Collection Time: 12/15/19  4:40 AM  Result Value Ref Range   Ammonia 67 (H) 9 - 35 umol/L    Comment: Performed at [facility], [address] [place], [place] [address]  Basic metabolic panel     Status: Abnormal   Collection Time: 12/15/19  4:40 AM  Result Value Ref Range   Sodium 142 135 - 145 mmol/L   Potassium 3.7 3.5 - 5.1 mmol/L   Chloride 110 98 - 111 mmol/L   CO2 21 (L) 22 - 32 mmol/L   Glucose, Bld 98 70 - 99 mg/dL    Comment: Glucose reference range applies only to samples taken after fasting for at least 8 hours.   BUN 10 6 - 20 mg/dL  Creatinine, Ser 0.62 0.61 - 1.24 mg/dL   Calcium 8.4 (L) 8.9 - 10.3 mg/dL   GFR, Estimated >60 >60 mL/min    Comment: (NOTE) Calculated using the CKD-EPI Creatinine Equation (2021)    Anion gap 11 5 - 15    Comment: Performed at [facility], [address] [place], [place] [address]  Magnesium     Status: Abnormal   Collection Time: 12/15/19  4:49 AM  Result Value Ref Range   Magnesium 1.6 (L) 1.7 - 2.4 mg/dL    Comment: Performed at [facility], [address], [place], [place] [address]  Draw ABG 1 hour after initiation of ventilator     Status: Abnormal (Preliminary result)   Collection Time: 12/15/19  6:58 PM  Result Value Ref Range   FIO2 0.50    Delivery systems VENTILATOR    Mode PRESSURE  REGULATED VOLUME CONTROL    VT 500 mL   LHR 15 resp/min   Peep/cpap 5.0 cm H20   pH, Arterial 7.41 7.35 - 7.45   pCO2 arterial 35 32 - 48 mmHg   pO2, Arterial 110 (H) 83 - 108 mmHg   Bicarbonate 22.2 20.0 - 28.0 mmol/L   Acid-base deficit 2.0 0.0 - 2.0 mmol/L   O2 Saturation 98.3 %   Patient temperature 37.0    Collection site REVIEWED BY    Sample type ARTERIAL DRAW    Allens test (pass/fail) PASS PASS    Comment: Performed at [facility], [address] [place], [place] [address]   Mechanical Rate PENDING   MRSA PCR Screening     Status: None   Collection Time: 12/15/19  7:04 PM   Specimen: Nasal Mucosa; Nasopharyngeal  Result Value Ref Range   MRSA by PCR NEGATIVE NEGATIVE    Comment:        The GeneXpert MRSA Assay (FDA approved for NASAL specimens only), is one component of a comprehensive MRSA colonization surveillance program. It is not intended to diagnose MRSA infection nor to guide or monitor treatment for MRSA infections. Performed at [facility], [address] [place], [place] [address]   Glucose, capillary     Status: None   Collection Time: 12/15/19  7:22 PM  Result Value Ref Range   Glucose-Capillary 96 70 - 99 mg/dL    Comment: Glucose reference range applies only to samples taken after fasting for at least 8 hours.    DG Abdomen 1 View  Result Date: 12/14/2019 CLINICAL DATA:  NG tube placement EXAM: ABDOMEN - 1 VIEW COMPARISON:  None. FINDINGS: The bowel gas pattern is normal. NG tube tip is seen within the proximal stomach. Side hole seen within the distal GE junction. No radio-opaque calculi or other significant radiographic abnormality are seen. IMPRESSION: NG tube tip seen just entering the proximal stomach and could be advanced several cm. Electronically Signed   By: Prudencio Pair M.D.   On: 12/14/2019 19:46   DG Chest Port 1 View  Result Date: 12/15/2019 CLINICAL DATA:  Endotracheal tube placement EXAM: PORTABLE CHEST 1 VIEW  COMPARISON:  November 23, 2019 FINDINGS: The endotracheal tube terminates above the carina by approximately 3.2 cm. The enteric tube extends below the left hemidiaphragm. The heart size is enlarged. The pulmonary vasculature is dilated centrally. There is vascular congestion without overt pulmonary edema. There are old healed left-sided rib fractures. There is no pneumothorax. The patient is status post prior TIPS placement. IMPRESSION: 1. Lines and tubes as above. 2.  Cardiomegaly with vascular congestion. No overt pulmonary edema. Electronically Signed   By: Constance Holster M.D.   On: 12/15/2019 19:15   [place] EKG SITE RITE  Result Date: 12/15/2019 If Site Rite image not attached, placement could not be confirmed due to current cardiac rhythm.   Review of Systems  Unable to obtain ROS from patient due to encephalopathy, status epilepticus and acuity of condition.  Blood pressure 133/80, pulse 64, temperature 99.2 F (37.3 C), temperature source Oral, resp. rate (!) 22, height 6' (1.829 m), weight 80.7 kg, SpO2 96 %. Physical Exam GENERAL: Disheveled appearing male, occasionally moaning but for the most part unresponsive/obtunded. HEAD: Normocephalic, atraumatic.  EYES: Pupils equal, round, reactive to light.  No scleral icterus.  MOUTH: Edentulous, airway class II, oral mucosa moist. NECK: Supple. No thyromegaly. Trachea midline. No JVD.  No adenopathy. PULMONARY: Poor air movement bilaterally.  Diffuse rhonchi and wheezes throughout. CARDIOVASCULAR: S1 and S2. Regular rate and rhythm.  No rubs, murmurs or gallops heard. ABDOMEN: Protuberant, soft, no grimacing on palpation.  Somewhat diminished bowel sounds. MUSCULOSKELETAL: No joint deformity, no clubbing, no edema.  NEUROLOGIC: Obtunded, focal seizure activity left upper extremity noted. SKIN: Intact,warm,dry.  Chronic stasis changes of the lower extremities.  Following exam patient had to be emergently  intubated.   Assessment/Plan:  Status epilepticus, known seizure disorder with breakthrough seizures Breakthrough on 2 anticonvulsants Recent alcohol binge (1 week ago  Patient transferred to ICU Intubated for initiation of benzodiazepine infusion/airway protection Ventilator bundle VAP bundle Midazolam infusion Continue Keppra and Zonegran Transfer to facility capable of continuous EEG MRI of the brain  Hepatocellular carcinoma Metastasis to adrenal and lung Patient followed at [facility] Poor overall prognosis per oncology at [facility] Wife wants FULL CODE for now MRI should help exclude potential metastatic disease to the brain Consider palliative care consultation  Hepatic encephalopathy Alcoholic liver disease with portal hypertension Elevated ammonia level-lactulose Supportive care Continue beta-blockers for portal hypertension Patient is status post TIPS 2007 Continue multivitamin supplementation, B1 and folate.  Hypomagnesemia Repleted, repeat level pending  Upper GI bleed May be alcoholic gastritis PPI Hold anticoagulants SCDs for DVT prophylaxis  Please see orders for details.  Discussed with Dr. Max Sane and with Dr. Cheral Marker, neurology.  Patient's wife updated.  She seems to have poor insight as to the patient's multiple issues.  Patient is currently FULL CODE.  Patient's overall prognosis is guarded.  Total critical care time 75 minutes excluding procedure time.  Adrian Don, MD [place] [facility] 12/15/2019, 8:49 PM   *This note was dictated using voice recognition software/Dragon.  Despite best efforts to proofread, errors can occur which can change the meaning.  Any change was purely unintentional.

## 2019-12-15 NOTE — Progress Notes (Addendum)
Subjective: The patient had recurrence of seizure activity overnight, described as upper body shaking, which resolved with 2 mg IV Ativan.   Objective: Current vital signs: BP 129/81 (BP Location: Right Arm)   Pulse 70   Temp 98.7 F (37.1 C) (Oral)   Resp 19   Ht 6' (1.829 m)   Wt 61.2 kg   SpO2 95%   BMI 18.30 kg/m  Vital signs in last 24 hours: Temp:  [97.7 F (36.5 C)-98.7 F (37.1 C)] 98.7 F (37.1 C) (12/04 0728) Pulse Rate:  [55-70] 70 (12/04 0728) Resp:  [16-25] 19 (12/04 0728) BP: (109-140)/(58-83) 129/81 (12/04 0728) SpO2:  [95 %-97 %] 95 % (12/04 0728)  Intake/Output from previous day: 12/03 0701 - 12/04 0700 In: 2150.2 [I.V.:1950.2; IV Piggyback:200] Out: 800 [Emesis/NG output:800] Intake/Output this shift: Total I/O In: 80 [NG/GT:80] Out: 400 [Emesis/NG output:400] Nutritional status:  Diet Order            Diet NPO time specified  Diet effective now                 Physical Exam  HEENT-  [place]/AT. No nuchal rigidity.  Lungs- Respirations unlabored Skin: Jaundiced  Neurological Examination Mental Status: Somnolent and encephalopathic. Will open eyes briefly to sustained light sternal rub, but will not follow commands today. Answered "yes" when asked if he quit drinking one week ago, but unclear if this is reliable information.  Cranial Nerves: II: Does not fixate or track. PERRL.  III,IV, VI: Palpebral fissures are symmetric when eyes are briefly open. Eyes conjugate and near the midline. Did not follow commands for visual tracking.  V,VII: Face symmetric. Responded to tactile stimuli.  VIII: Hearing intact to voice IX,X: Unable to visualize palate XI: No asymmetry noted.  Motor: Did not follow motor commands. Tone is decreased x 4.   Sensory: Weakly withdraws BLE to noxious plantar stimulation.  Cerebellar: Unable to assess.  Gait: Unable to assess  Lab Results: Results for orders placed or performed during the hospital encounter of 12/12/2019  (from the past 48 hour(s))  Comprehensive metabolic panel     Status: Abnormal   Collection Time: 12/29/2019  6:30 PM  Result Value Ref Range   Sodium 137 135 - 145 mmol/L   Potassium 3.5 3.5 - 5.1 mmol/L   Chloride 105 98 - 111 mmol/L   CO2 25 22 - 32 mmol/L   Glucose, Bld 97 70 - 99 mg/dL    Comment: Glucose reference range applies only to samples taken after fasting for at least 8 hours.   BUN 10 6 - 20 mg/dL   Creatinine, Ser 0.67 0.61 - 1.24 mg/dL   Calcium 8.3 (L) 8.9 - 10.3 mg/dL   Total Protein 5.1 (L) 6.5 - 8.1 g/dL   Albumin 2.6 (L) 3.5 - 5.0 g/dL   AST 40 15 - 41 U/L   ALT 29 0 - 44 U/L   Alkaline Phosphatase 151 (H) 38 - 126 U/L   Total Bilirubin 1.7 (H) 0.3 - 1.2 mg/dL   GFR, Estimated >60 >60 mL/min    Comment: (NOTE) Calculated using the CKD-EPI Creatinine Equation (2021)    Anion gap 7 5 - 15    Comment: Performed at [facility], [address] [place], [place] [address]  CBC with Differential     Status: Abnormal   Collection Time: 12/22/2019  6:30 PM  Result Value Ref Range   WBC 3.8 (L) 4.0 - 10.5 K/uL   RBC 2.64 (  L) 4.22 - 5.81 MIL/uL   Hemoglobin 9.8 (L) 13.0 - 17.0 g/dL   HCT 26.9 (L) 39 - 52 %   MCV 101.9 (H) 80.0 - 100.0 fL   MCH 37.1 (H) 26.0 - 34.0 pg   MCHC 36.4 (H) 30.0 - 36.0 g/dL   RDW 14.8 11.5 - 15.5 %   Platelets 98 (L) 150 - 400 K/uL    Comment: Immature Platelet Fraction may be clinically indicated, consider ordering this additional test [id number]    nRBC 0.0 0.0 - 0.2 %   Neutrophils Relative % 59 %   Neutro Abs 2.3 1.7 - 7.7 K/uL   Lymphocytes Relative 21 %   Lymphs Abs 0.8 0.7 - 4.0 K/uL   Monocytes Relative 13 %   Monocytes Absolute 0.5 0.1 - 1.0 K/uL   Eosinophils Relative 5 %   Eosinophils Absolute 0.2 0.0 - 0.5 K/uL   Basophils Relative 1 %   Basophils Absolute 0.0 0.0 - 0.1 K/uL   Immature Granulocytes 1 %   Abs Immature Granulocytes 0.02 0.00 - 0.07 K/uL    Comment: Performed at [facility], [address] [place], [place] [address]  Ethanol     Status: None   Collection Time: 01/01/2020  6:30 PM  Result Value Ref Range   Alcohol, Ethyl (B) <10 <10 mg/dL    Comment: (NOTE) Lowest detectable limit for serum alcohol is 10 mg/dL.  For medical purposes only. Performed at [facility], [address] [place], [place] [address]   Urinalysis, Complete w Microscopic Urine, Catheterized     Status: Abnormal   Collection Time: 01/02/2020  7:11 PM  Result Value Ref Range   Color, Urine YELLOW (A) YELLOW   APPearance CLEAR (A) CLEAR   Specific Gravity, Urine 1.013 1.005 - 1.030   pH 5.0 5.0 - 8.0   Glucose, UA NEGATIVE NEGATIVE mg/dL   Hgb urine dipstick NEGATIVE NEGATIVE   Bilirubin Urine NEGATIVE NEGATIVE   Ketones, ur NEGATIVE NEGATIVE mg/dL   Protein, ur NEGATIVE NEGATIVE mg/dL   Nitrite NEGATIVE NEGATIVE   Leukocytes,Ua NEGATIVE NEGATIVE   RBC / HPF 0-5 0 - 5 RBC/hpf   WBC, UA 0-5 0 - 5 WBC/hpf   Bacteria, UA RARE (A) NONE SEEN   Squamous Epithelial / LPF 0-5 0 - 5   Mucus PRESENT     Comment: Performed at [facility] [address], [place], [place] [address]  Urine Drug Screen, Qualitative     Status: Abnormal   Collection Time: 12/22/2019  7:11 PM  Result Value Ref Range   Tricyclic, Ur Screen POSITIVE (A) NONE DETECTED   Amphetamines, Ur Screen NONE DETECTED NONE DETECTED   MDMA (Ecstasy)Ur Screen NONE DETECTED NONE DETECTED   Cocaine Metabolite,Ur [place] NONE DETECTED NONE DETECTED   Opiate, Ur Screen NONE DETECTED NONE DETECTED   Phencyclidine (PCP) Ur S NONE DETECTED NONE DETECTED   Cannabinoid 50 Ng, Ur [place] POSITIVE (A) NONE DETECTED   Barbiturates, Ur Screen NONE DETECTED NONE DETECTED   Benzodiazepine, Ur Scrn POSITIVE (A) NONE DETECTED   Methadone Scn, Ur NONE DETECTED NONE DETECTED    Comment: (NOTE) Tricyclics + metabolites, urine    Cutoff 1000 ng/mL Amphetamines + metabolites, urine  Cutoff 1000 ng/mL MDMA (Ecstasy), urine               Cutoff 500 ng/mL Cocaine Metabolite, urine          Cutoff 300 ng/mL Opiate + metabolites, urine  Cutoff 300 ng/mL Phencyclidine (PCP), urine         Cutoff 25 ng/mL Cannabinoid, urine                 Cutoff 50 ng/mL Barbiturates + metabolites, urine  Cutoff 200 ng/mL Benzodiazepine, urine              Cutoff 200 ng/mL Methadone, urine                   Cutoff 300 ng/mL  The urine drug screen provides only a preliminary, unconfirmed analytical test result and should not be used for non-medical purposes. Clinical consideration and professional judgment should be applied to any positive drug screen result due to possible interfering substances. A more specific alternate chemical method must be used in order to obtain a confirmed analytical result. Gas chromatography / mass spectrometry (GC/MS) is the preferred confirm atory method. Performed at [facility], [address] [place], [place] [address]   Ammonia     Status: Abnormal   Collection Time: 12/12/2019  7:11 PM  Result Value Ref Range   Ammonia 108 (H) 9 - 35 umol/L    Comment: Performed at [facility], [address] [place], [place] [address]  Resp Panel by RT-PCR (Flu A&B, Covid) Nasopharyngeal Swab     Status: None   Collection Time: 12/26/2019  8:35 PM   Specimen: Nasopharyngeal Swab; Nasopharyngeal(NP) swabs in vial transport medium  Result Value Ref Range   SARS Coronavirus 2 by RT PCR NEGATIVE NEGATIVE    Comment: (NOTE) SARS-CoV-2 target nucleic acids are NOT DETECTED.  The SARS-CoV-2 RNA is generally detectable in upper respiratory specimens during the acute phase of infection. The lowest concentration of SARS-CoV-2 viral copies this assay can detect is 138 copies/mL. A negative result does not preclude SARS-Cov-2 infection and should not be used as the sole basis for treatment or other patient management decisions. A negative result may occur with  improper specimen collection/handling,  submission of specimen other than nasopharyngeal swab, presence of viral mutation(s) within the areas targeted by this assay, and inadequate number of viral copies(<138 copies/mL). A negative result must be combined with clinical observations, patient history, and epidemiological information. The expected result is Negative.  Fact Sheet for Patients:  EntrepreneurPulse.com.au  Fact Sheet for Healthcare Providers:  IncredibleEmployment.be  This test is no t yet approved or cleared by the [place] FDA and  has been authorized for detection and/or diagnosis of SARS-CoV-2 by FDA under an Emergency Use Authorization (EUA). This EUA will remain  in effect (meaning this test can be used) for the duration of the COVID-19 declaration under Section 564(b)(1) of the Act, 21 U.S.C.section 360bbb-3(b)(1), unless the authorization is terminated  or revoked sooner.       Influenza A by PCR NEGATIVE NEGATIVE   Influenza B by PCR NEGATIVE NEGATIVE    Comment: (NOTE) The Xpert Xpress SARS-CoV-2/FLU/RSV plus assay is intended as an aid in the diagnosis of influenza from Nasopharyngeal swab specimens and should not be used as a sole basis for treatment. Nasal washings and aspirates are unacceptable for Xpert Xpress SARS-CoV-2/FLU/RSV testing.  Fact Sheet for Patients: EntrepreneurPulse.com.au  Fact Sheet for Healthcare Providers: IncredibleEmployment.be  This test is not yet approved or cleared by the [place] FDA and has been authorized for detection and/or diagnosis of SARS-CoV-2 by FDA under an Emergency Use Authorization (EUA). This EUA will remain in effect (meaning this test can be used) for the  duration of the COVID-19 declaration under Section 564(b)(1) of the Act, 21 U.S.C. section 360bbb-3(b)(1), unless the authorization is terminated or revoked.  Performed at [facility], [address] [place], [place] [address]   Comprehensive metabolic panel     Status: Abnormal   Collection Time: 12/14/19  5:39 AM  Result Value Ref Range   Sodium 138 135 - 145 mmol/L   Potassium 4.1 3.5 - 5.1 mmol/L    Comment: HEMOLYSIS AT THIS LEVEL MAY AFFECT RESULT   Chloride 109 98 - 111 mmol/L   CO2 21 (L) 22 - 32 mmol/L   Glucose, Bld 97 70 - 99 mg/dL    Comment: Glucose reference range applies only to samples taken after fasting for at least 8 hours.   BUN 8 6 - 20 mg/dL   Creatinine, Ser 0.55 (L) 0.61 - 1.24 mg/dL   Calcium 8.3 (L) 8.9 - 10.3 mg/dL   Total Protein 5.1 (L) 6.5 - 8.1 g/dL   Albumin 2.5 (L) 3.5 - 5.0 g/dL   AST 47 (H) 15 - 41 U/L   ALT 27 0 - 44 U/L   Alkaline Phosphatase 142 (H) 38 - 126 U/L   Total Bilirubin 1.8 (H) 0.3 - 1.2 mg/dL   GFR, Estimated >60 >60 mL/min    Comment: (NOTE) Calculated using the CKD-EPI Creatinine Equation (2021)    Anion gap 8 5 - 15    Comment: Performed at [facility] [address] [place], [place] [address]  Magnesium     Status: None   Collection Time: 12/14/19  5:39 AM  Result Value Ref Range   Magnesium 1.8 1.7 - 2.4 mg/dL    Comment: Performed at [facility], [address] [place], [place] [address]  Ammonia     Status: Abnormal   Collection Time: 12/14/19  5:39 AM  Result Value Ref Range   Ammonia 74 (H) 9 - 35 umol/L    Comment: HEMOLYSIS AT THIS LEVEL MAY AFFECT RESULT Performed at [facility], [address] [place], [place] [address]   CBC     Status: Abnormal   Collection Time: 12/14/19  7:37 AM  Result Value Ref Range   WBC 4.5 4.0 - 10.5 K/uL   RBC 2.75 (L) 4.22 - 5.81 MIL/uL   Hemoglobin 10.0 (L) 13.0 - 17.0 g/dL   HCT 28.8 (L) 39 - 52 %   MCV 104.7 (H) 80.0 - 100.0 fL   MCH 36.4 (H) 26.0 - 34.0 pg   MCHC 34.7 30.0 - 36.0 g/dL   RDW 15.1 11.5 - 15.5 %   Platelets 99 (L) 150 - 400 K/uL    Comment: Immature Platelet Fraction may be clinically indicated, consider ordering  this additional test [id number]    nRBC 0.0 0.0 - 0.2 %    Comment: Performed at [facility], [address] [place], [place] [address]  CBC     Status: Abnormal   Collection Time: 12/15/19  4:40 AM  Result Value Ref Range   WBC 6.4 4.0 - 10.5 K/uL   RBC 2.90 (L) 4.22 - 5.81 MIL/uL   Hemoglobin 10.5 (L) 13.0 - 17.0 g/dL   HCT 30.4 (L) 39 - 52 %   MCV 104.8 (H) 80.0 - 100.0 fL   MCH 36.2 (H) 26.0 - 34.0 pg   MCHC 34.5 30.0 - 36.0 g/dL   RDW 15.2 11.5 - 15.5 %   Platelets 109 (L) 150 - 400 K/uL  Comment: Immature Platelet Fraction may be clinically indicated, consider ordering this additional test [id number]    nRBC 0.0 0.0 - 0.2 %    Comment: Performed at [facility], [address] [place], [place] [address]  Ammonia     Status: Abnormal   Collection Time: 12/15/19  4:40 AM  Result Value Ref Range   Ammonia 67 (H) 9 - 35 umol/L    Comment: Performed at [facility], [address] [place], [place] [address]  Basic metabolic panel     Status: Abnormal   Collection Time: 12/15/19  4:40 AM  Result Value Ref Range   Sodium 142 135 - 145 mmol/L   Potassium 3.7 3.5 - 5.1 mmol/L   Chloride 110 98 - 111 mmol/L   CO2 21 (L) 22 - 32 mmol/L   Glucose, Bld 98 70 - 99 mg/dL    Comment: Glucose reference range applies only to samples taken after fasting for at least 8 hours.   BUN 10 6 - 20 mg/dL   Creatinine, Ser 0.62 0.61 - 1.24 mg/dL   Calcium 8.4 (L) 8.9 - 10.3 mg/dL   GFR, Estimated >60 >60 mL/min    Comment: (NOTE) Calculated using the CKD-EPI Creatinine Equation (2021)    Anion gap 11 5 - 15    Comment: Performed at [facility], [address] [place], [place] [address]  Magnesium     Status: Abnormal   Collection Time: 12/15/19  4:49 AM  Result Value Ref Range   Magnesium 1.6 (L) 1.7 - 2.4 mg/dL    Comment: Performed at [facility], [address], [place],  [address]    Recent Results (from the past 240  hour(s))  Resp Panel by RT-PCR (Flu A&B, Covid) Nasopharyngeal Swab     Status: None   Collection Time: 12/16/2019  8:35 PM   Specimen: Nasopharyngeal Swab; Nasopharyngeal(NP) swabs in vial transport medium  Result Value Ref Range Status   SARS Coronavirus 2 by RT PCR NEGATIVE NEGATIVE Final    Comment: (NOTE) SARS-CoV-2 target nucleic acids are NOT DETECTED.  The SARS-CoV-2 RNA is generally detectable in upper respiratory specimens during the acute phase of infection. The lowest concentration of SARS-CoV-2 viral copies this assay can detect is 138 copies/mL. A negative result does not preclude SARS-Cov-2 infection and should not be used as the sole basis for treatment or other patient management decisions. A negative result may occur with  improper specimen collection/handling, submission of specimen other than nasopharyngeal swab, presence of viral mutation(s) within the areas targeted by this assay, and inadequate number of viral copies(<138 copies/mL). A negative result must be combined with clinical observations, patient history, and epidemiological information. The expected result is Negative.  Fact Sheet for Patients:  EntrepreneurPulse.com.au  Fact Sheet for Healthcare Providers:  IncredibleEmployment.be  This test is no t yet approved or cleared by the [place] FDA and  has been authorized for detection and/or diagnosis of SARS-CoV-2 by FDA under an Emergency Use Authorization (EUA). This EUA will remain  in effect (meaning this test can be used) for the duration of the COVID-19 declaration under Section 564(b)(1) of the Act, 21 U.S.C.section 360bbb-3(b)(1), unless the authorization is terminated  or revoked sooner.       Influenza A by PCR NEGATIVE NEGATIVE Final   Influenza B by PCR NEGATIVE NEGATIVE Final    Comment: (NOTE) The Xpert Xpress SARS-CoV-2/FLU/RSV plus assay is intended as an aid in the diagnosis of influenza from  Nasopharyngeal swab specimens and should not be used as a sole basis for treatment. Nasal washings and aspirates are unacceptable for Xpert Xpress SARS-CoV-2/FLU/RSV testing.  Fact Sheet for Patients: EntrepreneurPulse.com.au  Fact Sheet for Healthcare Providers: IncredibleEmployment.be  This test is not yet approved or cleared by the [place] FDA and has been authorized for detection and/or diagnosis of SARS-CoV-2 by FDA under an Emergency Use Authorization (EUA). This EUA will remain in effect (meaning this test can be used) for the duration of the COVID-19 declaration under Section 564(b)(1) of the Act, 21 U.S.C. section 360bbb-3(b)(1), unless the authorization is terminated or revoked.  Performed at [facility], [address] [place], [place] [address]     Lipid Panel No results for input(s): CHOL, TRIG, HDL, CHOLHDL, VLDL, LDLCALC in the last 72 hours.  Studies/Results: DG Abdomen 1 View  Result Date: 12/14/2019 CLINICAL DATA:  NG tube placement EXAM: ABDOMEN - 1 VIEW COMPARISON:  None. FINDINGS: The bowel gas pattern is normal. NG tube tip is seen within the proximal stomach. Side hole seen within the distal GE junction. No radio-opaque calculi or other significant radiographic abnormality are seen. IMPRESSION: NG tube tip seen just entering the proximal stomach and could be advanced several cm. Electronically Signed   By: Prudencio Pair M.D.   On: 12/14/2019 19:46   CT Head Wo Contrast  Result Date: 12/22/2019 CLINICAL DATA:  Seizure. Tonic colonic seizure lasting approximately 15-20 minutes. EXAM: CT HEAD WITHOUT CONTRAST TECHNIQUE: Contiguous axial images were obtained from the base of the skull through the vertex without intravenous contrast. COMPARISON:  07/11/2019 FINDINGS: Brain: No evidence of acute infarction, hemorrhage, hydrocephalus, extra-axial collection or mass lesion/mass effect. Mild prominence of the sulci  and ventricles. Vascular: No hyperdense vessel or unexpected calcification. Skull: Normal. Negative for fracture or focal lesion. Sinuses/Orbits: No acute finding. Other: None. IMPRESSION: 1. No acute intracranial abnormalities. 2. Mild brain atrophy. Electronically Signed   By: Kerby Moors M.D.   On: 12/30/2019 19:19    Medications:  Scheduled: . enoxaparin (LOVENOX) injection  40 mg Subcutaneous Q24H  . feeding supplement  237 mL Per Tube BID BM  . fluorouracil  1 application Topical Daily  . furosemide  10 mg Intravenous BID  . furosemide  40 mg Intravenous Once  . lactulose  30 g Per Tube TID  . lidocaine  1 patch Transdermal Q12H  . melatonin  10 mg Per Tube QHS  . multivitamin with minerals  1 tablet Per Tube Daily  . pantoprazole (PROTONIX) IV  40 mg Intravenous Q12H  . propranolol  20 mg Per Tube Daily  . rifaximin  550 mg Per Tube BID  . sertraline  50 mg Per Tube Daily  . spironolactone  50 mg Per Tube BID  . thiamine injection  100 mg Intravenous Daily  . traZODone  300 mg Per Tube QHS  . vitamin E  400 Units Per Tube QPM  . zonisamide  400 mg Oral QHS   Continuous: . folic acid (FOLVITE) IVPB    . levETIRAcetam Stopped (12/14/19 2331)  . magnesium sulfate bolus IVPB 2 g (12/15/19 0732)    Assessment: 59 year old male with an extensive PMHx including alcoholic liver disease and epilepsy, presenting with breakthrough seizures.  1. Exam reveals a somnolent to obtunded older-appearing male with jaundice. He will verbalize but does not fixate or track visually and is not responding to commands today. No adventitious movements or automatisms to suggest seizure activity.  2.  His outpatient anticonvulsant regimen consists of Keppra 750 mg BID and Zonegran 400 mg QHS. He has been continued on these medications.  3. Hyperammonemia.  4. Although EtOH withdrawal is suspected, seizure recurrence due to Ultram use, hyperammonemia or spontaneous breakthrough seizure is also on the  DDx.   Recommendations: 1. CIWA protocol. Consider starting a scheduled benzodiazepine followed by a taper given probably EtOH withdrawal seizures at presentation as well as breakthrough seizure overnight while on Keppra and Zonegran.  2. Continue Keppra at 1000 mg BID IV  3. Continue Zonegran 400 mg QHS via NGT.   4. Monitoring and treatment of his hyperammonemia.  5. When awake and alert, will need to review outpatient seizure precautions with the patient: Per [facility] statutes, patients with seizures are not allowed to drive until  they have been seizure-free for six months. Use caution when using heavy equipment or power tools. Avoid working on ladders or at heights. Take showers instead of baths. Ensure the water temperature is not too high on the home water heater. Do not go swimming alone. When caring for infants or small children, sit down when holding, feeding, or changing them to minimize risk of injury to the child in the event you have a seizure. Also, Maintain good sleep hygiene. Avoid alcohol.  Addendum: --The patient had a breakthrough seizure lasting for 30 seconds --Breakthrough seizure while on 2 anticonvulsants increases likelihood that it is secondary to EtOH withdrawal --Scheduled Ativan 2 mg IV q6h x 4 doses, then 1 mg IV q6h x 8 doses has been ordered. Continue as well with PRN Ativan --If the patient has another breakthrough seizure despite scheduled Ativan, he may need to be placed on an Ativan drip, which would most likely require transfer to the ICU.    LOS: 2 days   @Electronically  signed: Dr. Kerney Elbe 12/15/2019  8:23 AM

## 2019-12-16 DIAGNOSIS — F1020 Alcohol dependence, uncomplicated: Secondary | ICD-10-CM | POA: Diagnosis not present

## 2019-12-16 DIAGNOSIS — K7290 Hepatic failure, unspecified without coma: Secondary | ICD-10-CM | POA: Diagnosis not present

## 2019-12-16 DIAGNOSIS — G40909 Epilepsy, unspecified, not intractable, without status epilepticus: Secondary | ICD-10-CM | POA: Diagnosis not present

## 2019-12-16 DIAGNOSIS — G40901 Epilepsy, unspecified, not intractable, with status epilepticus: Secondary | ICD-10-CM | POA: Diagnosis not present

## 2019-12-16 LAB — CBC
HCT: 32.6 % — ABNORMAL LOW (ref 39.0–52.0)
Hemoglobin: 11.2 g/dL — ABNORMAL LOW (ref 13.0–17.0)
MCH: 36.1 pg — ABNORMAL HIGH (ref 26.0–34.0)
MCHC: 34.4 g/dL (ref 30.0–36.0)
MCV: 105.2 fL — ABNORMAL HIGH (ref 80.0–100.0)
Platelets: 92 10*3/uL — ABNORMAL LOW (ref 150–400)
RBC: 3.1 MIL/uL — ABNORMAL LOW (ref 4.22–5.81)
RDW: 15.3 % (ref 11.5–15.5)
WBC: 10.2 10*3/uL (ref 4.0–10.5)
nRBC: 0 % (ref 0.0–0.2)

## 2019-12-16 LAB — AMMONIA: Ammonia: 54 umol/L — ABNORMAL HIGH (ref 9–35)

## 2019-12-16 LAB — BASIC METABOLIC PANEL
Anion gap: 7 (ref 5–15)
BUN: 18 mg/dL (ref 6–20)
CO2: 24 mmol/L (ref 22–32)
Calcium: 8.5 mg/dL — ABNORMAL LOW (ref 8.9–10.3)
Chloride: 110 mmol/L (ref 98–111)
Creatinine, Ser: 0.69 mg/dL (ref 0.61–1.24)
GFR, Estimated: >60 mL/min (ref >60)
Glucose, Bld: 99 mg/dL (ref 70–99)
Potassium: 3.7 mmol/L (ref 3.5–5.1)
Sodium: 141 mmol/L (ref 135–145)

## 2019-12-16 LAB — GLUCOSE, CAPILLARY
Glucose-Capillary: 89 mg/dL (ref 70–99)
Glucose-Capillary: 96 mg/dL (ref 70–99)
Glucose-Capillary: 106 mg/dL — ABNORMAL HIGH (ref 70–99)
Glucose-Capillary: 109 mg/dL — ABNORMAL HIGH (ref 70–99)
Glucose-Capillary: 116 mg/dL — ABNORMAL HIGH (ref 70–99)
Glucose-Capillary: 114 mg/dL — ABNORMAL HIGH (ref 70–99)

## 2019-12-16 LAB — MAGNESIUM: Magnesium: 2.4 mg/dL (ref 1.7–2.4)

## 2019-12-16 MED ORDER — SODIUM CHLORIDE 0.9% FLUSH
10.0000 mL | Freq: Two times a day (BID) | INTRAVENOUS | Status: DC
  Administered 2019-12-16: 15 mL
  Administered 2019-12-16 – 2019-12-17 (×2): 10 mL
  Administered 2019-12-17: 20 mL
  Administered 2019-12-18: 10 mL
  Administered 2019-12-18: 30 mL

## 2019-12-16 MED ORDER — SODIUM CHLORIDE 0.9% FLUSH: 10.0000 mL | INTRAVENOUS | Status: DC | PRN

## 2019-12-16 MED ORDER — GADOBUTROL 1 MMOL/ML IV SOLN
7.0000 mL | Freq: Once | INTRAVENOUS | Status: AC | PRN
  Administered 2019-12-16: 7.5 mL via INTRAVENOUS
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
## 2019-12-16 NOTE — Progress Notes (Signed)
Neuro: Wake up assessment went well-tolerated vent, no significant neuro changes, tolerated sedation wean but had to be increased back to 100% for PICC insertion Resp: stable on vent settings CV: afebrile, right upper arm PICC placed GIGU: NPO, NG tube to low intermittent suction, no BM, no emesis, foley in place Skin: bruising on arms, otherwise clean dry and intact Social: Wife visited for a short time this AM, this RN spoke with her regarding the patient's code status, she expressed she is starting to lean toward changing his code status in case "his heart gives out"  But she needed another day to think about it.  This RN reported to night shift to have palliative care speak with her tomorrow to educated her on their options.   Events:  Right upper arm PICC line placed, ok to use.  Wake up assessment completed and well tolerated

## 2019-12-16 NOTE — Progress Notes (Signed)
Peripherally Inserted Central Catheter Placement  The IV Nurse has discussed with the patient and/or persons authorized to consent for the patient, the purpose of this procedure and the potential benefits and risks involved with this procedure.  The benefits include less needle sticks, lab draws from the catheter, and the patient may be discharged home with the catheter. Risks include, but not limited to, infection, bleeding, blood clot (thrombus formation), and puncture of an artery; nerve damage and irregular heartbeat and possibility to perform a PICC exchange if needed/ordered by physician.  Alternatives to this procedure were also discussed.  Bard Power PICC patient education guide, fact sheet on infection prevention and patient information card has been provided to patient /or left at bedside.  Consent obtained by Rennie Natter. PICC Placement Documentation  PICC Triple Lumen 02/22/15 PICC Right Basilic 40 cm 0 cm (Active)  Indication for Insertion or Continuance of Line Vasoactive infusions;Prolonged intravenous therapies 12/16/19 1200  Exposed Catheter (cm) 0 cm 12/16/19 1200  Site Assessment Clean;Dry;Intact 12/16/19 1200  Lumen #1 Status Saline locked;Flushed;Blood return noted 12/16/19 1200  Lumen #2 Status Flushed;Saline locked;Blood return noted 12/16/19 1200  Lumen #3 Status Flushed;Saline locked;Blood return noted 12/16/19 1200  Dressing Type Transparent 12/16/19 1200  Dressing Status Clean;Dry;Intact 12/16/19 1200  Antimicrobial disc in place? Yes 12/16/19 1200  Safety Lock Not Applicable 35/67/01 4103  Line Care Connections checked and tightened 12/16/19 1200  Line Adjustment (NICU/IV Team Only) No 12/16/19 1200  Dressing Intervention New dressing 12/16/19 1200  Dressing Change Due 12/23/19 12/16/19 1200       Rolena Infante 12/16/2019, 12:45 PM

## 2019-12-16 NOTE — Progress Notes (Signed)
Follow up - Critical Care Medicine Note  Patient Details:    Adrian Hale is an 59 y.o. male,current smoker (tobacco/marijuana) with a history of alcoholic cirrhosis, portal hypertension (status post TIPS), seizure disorder and metastatic hepatocellular carcinoma who presented to [facility] on 2 December because of breakthrough seizures.  Patient developed status epilepticus and had to be transferred to the ICU and intubated for Versed infusion.  PCCM asked to assume care in ICU.  Lines, Airways, Drains: Airway 8.5 mm (Active)  Secured at (cm) 25 cm 12/16/19 1157  Measured From Lips 12/16/19 1157  Secured Location Left 12/16/19 1157  Secured By [employer] 12/16/19 1157  Tube Holder Repositioned Yes 12/16/19 0846  Prone position No 12/16/19 0800  Cuff Pressure (cm H2O) 26 cm H2O 12/16/19 1157  Site Condition Cool;Dry 12/16/19 1157     NG/OG Tube Nasogastric 18 Fr. Left nare Xray;Aucultation Documented cm marking at nare/ corner of mouth 60 cm (Active)  Cm Marking at Nare/Corner of Mouth (if applicable) 57 cm 75/17/00 0800  External Length of Tube (cm) - (if applicable) 58 cm 17/49/44 0728  Site Assessment Clean;Dry;Intact 12/16/19 0800  Ongoing Placement Verification No change in cm markings or external length of tube from initial placement;No change in respiratory status;No acute changes, not attributed to clinical condition 12/16/19 0800  Status Suction-low intermittent 12/16/19 0800  Drainage Appearance Brown 12/16/19 0800  Intake (mL) 60 mL 12/15/19 1313  Output (mL) 25 mL 12/16/19 0800     Urethral Catheter C.Brooks NT+3 Double-lumen 16 Fr. (Active)  Indication for Insertion or Continuance of Catheter Unstable critically ill patients first 24-48 hours (See Criteria) 12/16/19 0800  Site Assessment Clean;Intact 12/16/19 0800  Catheter Maintenance Bag below level of bladder;Catheter secured;Drainage bag/tubing not touching floor;Insertion date on drainage bag;No dependent  loops;Seal intact 12/16/19 0800  Collection Container Standard drainage bag 12/16/19 0800  Securement Method Securing device (Describe) 12/16/19 0800  Urinary Catheter Interventions (if applicable) Unclamped 96/75/91 0800  Output (mL) 425 mL 12/16/19 1000    Anti-infectives:  Anti-infectives (From admission, onward)   Start     Dose/Rate Route Frequency Ordered Stop   12/15/19 1000  rifaximin (XIFAXAN) tablet 550 mg        550 mg Per Tube 2 times daily 12/14/19 2258     12/15/19 0000  rifaximin (XIFAXAN) 550 MG TABS tablet        550 mg Per Tube 2 times daily 12/15/19 1749     12/14/19 0045  rifaximin (XIFAXAN) tablet 550 mg  Status:  Discontinued        550 mg Oral 2 times daily 01/03/2020 2330 12/14/19 2258     Scheduled Meds: . chlorhexidine gluconate (MEDLINE KIT)  15 mL Mouth Rinse BID  . Chlorhexidine Gluconate Cloth  6 each Topical Q0600  . feeding supplement  237 mL Per Tube BID BM  . furosemide  10 mg Intravenous BID  . furosemide  40 mg Intravenous Once  . ipratropium-albuterol  3 mL Nebulization Q6H  . lactulose  30 g Per Tube TID  . LORazepam  1 mg Intravenous Q6H  . mouth rinse  15 mL Mouth Rinse 10 times per day  . multivitamin with minerals  1 tablet Per Tube Daily  . pantoprazole (PROTONIX) IV  40 mg Intravenous Q12H  . propranolol  20 mg Per Tube Daily  . rifaximin  550 mg Per Tube BID  . sodium chloride flush  10-40 mL Intracatheter Q12H  . spironolactone  50  mg Per Tube BID  . vitamin E  400 Units Per Tube QPM  . zonisamide  400 mg Oral QHS   Continuous Infusions: . folic acid (FOLVITE) IVPB    . levETIRAcetam 1,500 mg (12/16/19 2307)  . midazolam 10 mg/hr (12/16/19 1900)   PRN Meds:.acetaminophen **OR** acetaminophen, fentaNYL (SUBLIMAZE) injection, LORazepam, magnesium hydroxide, [DISCONTINUED] ondansetron **OR** ondansetron (ZOFRAN) IV, sodium chloride flush   Microbiology: Results for orders placed or performed during the hospital encounter of  12/26/2019  Resp Panel by RT-PCR (Flu A&B, Covid) Nasopharyngeal Swab     Status: None   Collection Time: 12/14/2019  8:35 PM   Specimen: Nasopharyngeal Swab; Nasopharyngeal(NP) swabs in vial transport medium  Result Value Ref Range Status   SARS Coronavirus 2 by RT PCR NEGATIVE NEGATIVE Final    Comment: (NOTE) SARS-CoV-2 target nucleic acids are NOT DETECTED.  The SARS-CoV-2 RNA is generally detectable in upper respiratory specimens during the acute phase of infection. The lowest concentration of SARS-CoV-2 viral copies this assay can detect is 138 copies/mL. A negative result does not preclude SARS-Cov-2 infection and should not be used as the sole basis for treatment or other patient management decisions. A negative result may occur with  improper specimen collection/handling, submission of specimen other than nasopharyngeal swab, presence of viral mutation(s) within the areas targeted by this assay, and inadequate number of viral copies(<138 copies/mL). A negative result must be combined with clinical observations, patient history, and epidemiological information. The expected result is Negative.  Fact Sheet for Patients:  EntrepreneurPulse.com.au  Fact Sheet for Healthcare Providers:  IncredibleEmployment.be  This test is no t yet approved or cleared by the [place] FDA and  has been authorized for detection and/or diagnosis of SARS-CoV-2 by FDA under an Emergency Use Authorization (EUA). This EUA will remain  in effect (meaning this test can be used) for the duration of the COVID-19 declaration under Section 564(b)(1) of the Act, 21 U.S.C.section 360bbb-3(b)(1), unless the authorization is terminated  or revoked sooner.       Influenza A by PCR NEGATIVE NEGATIVE Final   Influenza B by PCR NEGATIVE NEGATIVE Final    Comment: (NOTE) The Xpert Xpress SARS-CoV-2/FLU/RSV plus assay is intended as an aid in the diagnosis of influenza from  Nasopharyngeal swab specimens and should not be used as a sole basis for treatment. Nasal washings and aspirates are unacceptable for Xpert Xpress SARS-CoV-2/FLU/RSV testing.  Fact Sheet for Patients: EntrepreneurPulse.com.au  Fact Sheet for Healthcare Providers: IncredibleEmployment.be  This test is not yet approved or cleared by the [place] FDA and has been authorized for detection and/or diagnosis of SARS-CoV-2 by FDA under an Emergency Use Authorization (EUA). This EUA will remain in effect (meaning this test can be used) for the duration of the COVID-19 declaration under Section 564(b)(1) of the Act, 21 U.S.C. section 360bbb-3(b)(1), unless the authorization is terminated or revoked.  Performed at [facility], [address] [place], [place] [address]   MRSA PCR Screening     Status: None   Collection Time: 12/15/19  7:04 PM   Specimen: Nasal Mucosa; Nasopharyngeal  Result Value Ref Range Status   MRSA by PCR NEGATIVE NEGATIVE Final    Comment:        The GeneXpert MRSA Assay (FDA approved for NASAL specimens only), is one component of a comprehensive MRSA colonization surveillance program. It is not intended to diagnose MRSA infection nor to guide or monitor treatment for MRSA infections. Performed at [facility],  [place], [place] [address]     Best Practice/Protocols:  VTE Prophylaxis: Mechanical GI Prophylaxis: Proton Pump Inhibitor Continous Sedation Hyperglycemia (ICU) VENT/VAP  Events: 12/15/2019: Admitted to ICU in status epilepticus, intubated 12/16/2019: Transfer to [facility] canceled per wife's request patient's condition has stabilized, will continue to treat at [facility]  Studies: DG Abdomen 1 View  Result Date: 12/14/2019 CLINICAL DATA:  NG tube placement EXAM: ABDOMEN - 1 VIEW COMPARISON:  None. FINDINGS: The bowel gas pattern is normal. NG tube tip is seen within the  proximal stomach. Side hole seen within the distal GE junction. No radio-opaque calculi or other significant radiographic abnormality are seen. IMPRESSION: NG tube tip seen just entering the proximal stomach and could be advanced several cm. Electronically Signed   By: Prudencio Pair M.D.   On: 12/14/2019 19:46   CT Head Wo Contrast  Result Date: 12/22/2019 CLINICAL DATA:  Seizure. Tonic colonic seizure lasting approximately 15-20 minutes. EXAM: CT HEAD WITHOUT CONTRAST TECHNIQUE: Contiguous axial images were obtained from the base of the skull through the vertex without intravenous contrast. COMPARISON:  07/11/2019 FINDINGS: Brain: No evidence of acute infarction, hemorrhage, hydrocephalus, extra-axial collection or mass lesion/mass effect. Mild prominence of the sulci and ventricles. Vascular: No hyperdense vessel or unexpected calcification. Skull: Normal. Negative for fracture or focal lesion. Sinuses/Orbits: No acute finding. Other: None. IMPRESSION: 1. No acute intracranial abnormalities. 2. Mild brain atrophy. Electronically Signed   By: Kerby Moors M.D.   On: 12/22/2019 19:19   MR BRAIN W WO CONTRAST  Result Date: 12/16/2019 CLINICAL DATA:  Seizure EXAM: MRI HEAD WITHOUT AND WITH CONTRAST TECHNIQUE: Multiplanar, multiecho pulse sequences of the brain and surrounding structures were obtained without and with intravenous contrast. CONTRAST:  7.80m GADAVIST GADOBUTROL 1 MMOL/ML IV SOLN COMPARISON:  None. FINDINGS: Brain: There is cortical diffusion restriction along the cingulate gyri and within the posterior parietal lobe. No acute or chronic hemorrhage. There is multifocal hyperintense T2-weighted signal within the white matter. Parenchymal volume and CSF spaces are normal. There is hyperintense T1-weighted signal within the globi pallidi. Midline structures are normal. There is no abnormal contrast enhancement. Vascular: Major flow voids are preserved. Skull and upper cervical spine: Normal  calvarium and skull base. Visualized upper cervical spine and soft tissues are normal. Sinuses/Orbits:No paranasal sinus fluid levels or advanced mucosal thickening. No mastoid or middle ear effusion. Normal orbits. IMPRESSION: 1. Cortical diffusion restriction along the cingulate gyri and within the posterior parietal lobe. This may be a postictal phenomenon. Posterior reversible encephalopathy syndrome (PRES) is also a possibility. 2. No hemorrhage or mass effect. 3. Hyperintense T1-weighted signal within the globi pallidi, most commonly due to manganese deposition in the setting of chronic liver disease. Electronically Signed   By: KUlyses JarredM.D.   On: 12/16/2019 01:07   DG Chest Port 1 View  Result Date: 12/15/2019 CLINICAL DATA:  Endotracheal tube placement EXAM: PORTABLE CHEST 1 VIEW COMPARISON:  November 23, 2019 FINDINGS: The endotracheal tube terminates above the carina by approximately 3.2 cm. The enteric tube extends below the left hemidiaphragm. The heart size is enlarged. The pulmonary vasculature is dilated centrally. There is vascular congestion without overt pulmonary edema. There are old healed left-sided rib fractures. There is no pneumothorax. The patient is status post prior TIPS placement. IMPRESSION: 1. Lines and tubes as above. 2. Cardiomegaly with vascular congestion. No overt pulmonary edema. Electronically Signed   By: CConstance HolsterM.D.   On: 12/15/2019 19:15  DG Chest Portable 1 View  Result Date: 11/23/2019 CLINICAL DATA:  Left chest pain EXAM: PORTABLE CHEST 1 VIEW COMPARISON:  07/12/2019 FINDINGS: Borderline heart size. Unremarkable aortic contours. Borderline vascular congestion. There is no edema, consolidation, effusion, or pneumothorax. Remote and healed left rib fractures. IMPRESSION: Borderline vascular congestion. Electronically Signed   By: Monte Fantasia M.D.   On: 11/23/2019 07:12   CT Angio Chest/Abd/Pel for Dissection W and/or Wo Contrast  Result  Date: 11/23/2019 CLINICAL DATA:  Chest and back pain. History of cirrhosis and tips procedure. EXAM: CT ANGIOGRAPHY CHEST, ABDOMEN AND PELVIS TECHNIQUE: Non-contrast CT of the chest was initially obtained. Multidetector CT imaging through the chest, abdomen and pelvis was performed using the standard protocol during bolus administration of intravenous contrast. Multiplanar reconstructed images and MIPs were obtained and reviewed to evaluate the vascular anatomy. CONTRAST:  124m OMNIPAQUE IOHEXOL 350 MG/ML SOLN COMPARISON:  CT scan 05/25/2010 FINDINGS: CTA CHEST FINDINGS Cardiovascular: The heart is normal in size. No pericardial effusion. There is mild tortuosity and calcification of the thoracic aorta but no focal aneurysm or dissection. The aortic branch vessels are patent. Scattered calcifications. Scattered coronary artery calcifications. The pulmonary arteries are enlarged suggesting pulmonary hypertension. No filling defects to suggest pulmonary embolism. Mediastinum/Nodes: Small scattered mediastinal and hilar lymph nodes but no mass or overt adenopathy. The esophagus is grossly normal. Lungs/Pleura: Innumerable small bilateral pulmonary nodules worrisome for metastatic disease. No pulmonary mass or pleural effusion. Musculoskeletal: No acute bony findings. No destructive bone lesions. Remote healed rib fractures are noted. Mild asymmetric left-sided gynecomastia is noted. Recommend clinical correlation. No supraclavicular or axillary adenopathy. Review of the MIP images confirms the above findings. CTA ABDOMEN AND PELVIS FINDINGS VASCULAR Aorta: Moderate age advanced atherosclerotic calcifications but no aneurysm or dissection. Celiac: Small ostial calcifications but no stenosis. SMA: Moderate ostial calcifications but no significant stenosis. Renals: Bilateral ostial calcifications but no significant stenosis. IMA: Patent. Inflow: Moderate atherosclerotic calcifications involving both common iliac  arteries but no aneurysm or dissection. Veins: The major venous structures are grossly patent. Review of the MIP images confirms the above findings. NON-VASCULAR Hepatobiliary: Advanced cirrhotic changes involving the liver. Evidence of a tips procedure with a portal vein to IVC shunt. There are numerous low-attenuation lesions in the liver. Most of these measure is simple cyst. I do not see any definite worrisome hepatic lesions. The gallbladder is moderately distended. There are small layering gallstones noted. No common bile duct dilatation. Pancreas: No mass, inflammation or ductal dilatation. Spleen: Mild splenomegaly. Small splenic lesions are noted. Adrenals/Urinary Tract: There is a large partially necrotic enhancing left adrenal gland mass measuring approximately 10 x 8.5 x 7.4 cm. There is some mass effect on the upper pole region of the left kidney and also on the pancreas but no direct invasion. 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 The right adrenal gland is normal. Both kidneys are unremarkable. No renal lesions or hydronephrosis. The bladder is unremarkable. Stomach/Bowel: Stomach, duodenum, small bowel and colon are grossly normal without oral contrast. No acute inflammatory changes, mass lesions or obstructive findings. The terminal ileum is normal. The appendix is normal. Colonic diverticulosis without findings for acute diverticulitis. Lymphatic: Small scattered mesenteric and retroperitoneal lymph nodes but no overt adenopathy. Reproductive: The prostate gland and seminal vesicles are unremarkable. Other: No free pelvic fluid collections or pelvic adenopathy. No inguinal adenopathy. Musculoskeletal: There are few small scattered sclerotic bone lesions noted in the spine and pelvis in the fourth right posterior rib.  These were also present on the prior CT scan from 2012 but were much smaller. There are likely benign bone islands. Review of the MIP images confirms the above  findings. IMPRESSION: 1. 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 2. Innumerable small bilateral pulmonary nodules worrisome for metastatic disease. 3. Advanced cirrhotic changes involving the liver and evidence of prior tips shunt. Numerous low-attenuation liver lesions are likely benign cysts. No worrisome hepatic lesions are identified. 4. Scattered sclerotic bone lesions, likely benign bone islands. 5. Age advanced atherosclerotic calcifications involving the thoracic and abdominal aorta and branch vessels but no aneurysm or dissection or significant stenosis. Aortic Atherosclerosis (ICD10-I70.0). Electronically Signed   By: Marijo Sanes M.D.   On: 11/23/2019 08:02   [place] EKG SITE RITE  Result Date: 12/15/2019 If Site Rite image not attached, placement could not be confirmed due to current cardiac rhythm.  Results for orders placed or performed during the hospital encounter of 12/24/2019 (from the past 24 hour(s))  Glucose, capillary     Status: None   Collection Time: 12/15/19 11:34 PM  Result Value Ref Range   Glucose-Capillary 96 70 - 99 mg/dL  Glucose, capillary     Status: None   Collection Time: 12/16/19  3:19 AM  Result Value Ref Range   Glucose-Capillary 89 70 - 99 mg/dL  CBC     Status: Abnormal   Collection Time: 12/16/19  4:47 AM  Result Value Ref Range   WBC 10.2 4.0 - 10.5 K/uL   RBC 3.10 (L) 4.22 - 5.81 MIL/uL   Hemoglobin 11.2 (L) 13.0 - 17.0 g/dL   HCT 32.6 (L) 39 - 52 %   MCV 105.2 (H) 80.0 - 100.0 fL   MCH 36.1 (H) 26.0 - 34.0 pg   MCHC 34.4 30.0 - 36.0 g/dL   RDW 15.3 11.5 - 15.5 %   Platelets 92 (L) 150 - 400 K/uL   nRBC 0.0 0.0 - 0.2 %  Ammonia     Status: Abnormal   Collection Time: 12/16/19  4:47 AM  Result Value Ref Range   Ammonia 54 (H) 9 - 35 umol/L  Basic metabolic panel     Status: Abnormal   Collection Time: 12/16/19  4:47 AM  Result Value Ref Range   Sodium 141 135 - 145 mmol/L   Potassium 3.7 3.5 - 5.1 mmol/L    Chloride 110 98 - 111 mmol/L   CO2 24 22 - 32 mmol/L   Glucose, Bld 99 70 - 99 mg/dL   BUN 18 6 - 20 mg/dL   Creatinine, Ser 0.69 0.61 - 1.24 mg/dL   Calcium 8.5 (L) 8.9 - 10.3 mg/dL   GFR, Estimated >60 >60 mL/min   Anion gap 7 5 - 15  Magnesium     Status: None   Collection Time: 12/16/19  4:47 AM  Result Value Ref Range   Magnesium 2.4 1.7 - 2.4 mg/dL  Glucose, capillary     Status: None   Collection Time: 12/16/19  7:20 AM  Result Value Ref Range   Glucose-Capillary 96 70 - 99 mg/dL  Glucose, capillary     Status: Abnormal   Collection Time: 12/16/19  8:37 AM  Result Value Ref Range   Glucose-Capillary 106 (H) 70 - 99 mg/dL  Glucose, capillary     Status: Abnormal   Collection Time: 12/16/19 11:25 AM  Result Value Ref Range   Glucose-Capillary 109 (H) 70 - 99 mg/dL  Glucose, capillary  Status: Abnormal   Collection Time: 12/16/19  3:59 PM  Result Value Ref Range   Glucose-Capillary 116 (H) 70 - 99 mg/dL  Glucose, capillary     Status: Abnormal   Collection Time: 12/16/19  7:54 PM  Result Value Ref Range   Glucose-Capillary 114 (H) 70 - 99 mg/dL     Consults: Treatment Team:  Pccm, Md, MD   Subjective:    Overnight Issues: No further seizure activity is noted since heavily sedated on the ventilator.  No hemodynamic issues.  Objective:  Vital signs for last 24 hours: Temp:  [97.7 F (36.5 C)-99.2 F (37.3 C)] 99.1 F (37.3 C) (12/05 0800) Pulse Rate:  [56-67] 56 (12/05 1200) Resp:  [13-22] 16 (12/05 1200) BP: (121-156)/(70-90) 136/80 (12/05 1200) SpO2:  [92 %-100 %] 99 % (12/05 1200) FiO2 (%):  [40 %-50 %] 40 % (12/05 1157) Weight:  [80.7 kg] 80.7 kg (12/04 1917)  Hemodynamic parameters for last 24 hours:    Intake/Output from previous day: 12/04 0701 - 12/05 0700 In: 410 [I.V.:10; NG/GT:200; IV Piggyback:200] Out: 2450 [Urine:1250; Emesis/NG output:1200]  Intake/Output this shift: Total I/O In: 130 [I.V.:30; IV Piggyback:100] Out: 550  [Urine:525; Emesis/NG output:25]  Vent settings for last 24 hours: Vent Mode: PRVC FiO2 (%):  [40 %-50 %] 40 % Set Rate:  [15 bmp] 15 bmp Vt Set:  [500 mL] 500 mL PEEP:  [5 cmH20] 5 cmH20 Plateau Pressure:  [13 cmH20-15 cmH20] 13 cmH20  Physical Exam:  GENERAL: Heavily sedated, intubated mechanically ventilated. HEAD: Normocephalic, atraumatic.  EYES: Pupils equal, round, reactive to light.  No scleral icterus.  MOUTH: Orotracheally intubated.  NG in place (present prior to ICU admission) NECK: Supple. No thyromegaly. Trachea midline. No JVD.  No adenopathy. PULMONARY: Poor air movement bilaterally.  Diffuse rhonchi with no wheezes throughout. CARDIOVASCULAR: S1 and S2. Regular rate and rhythm.  No rubs, murmurs or gallops heard. ABDOMEN: Protuberant, soft, no grimacing on palpation.  Somewhat diminished bowel sounds. MUSCULOSKELETAL: No joint deformity, no clubbing, no edema.  NEUROLOGIC: Obtunded, focal seizure activity left upper extremity noted. SKIN: Intact,warm,dry.  Chronic stasis changes of the lower extremities.  No seizure activity noted per nursing since patient intubated and heavily sedated.  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 Updated wife via telephone conversation today.  Per wife's request the patient will not be transferred to [facility].  Discussed with Dr. Cheral Marker, neurology, will obtain  EEG in the morning.  Critical Care Total Time*: 45 Minutes  C. Derrill Kay, MD [place] PCCM 12/16/2019  *Care during the described time interval was provided by me and/or other providers on the critical care team.  I have reviewed this patient's available data, including medical history, events of note, physical examination and test results as part of my evaluation.  **This note was dictated using voice recognition software/Dragon.  Despite best efforts to proofread, errors can occur which can change the meaning.  Any change was purely unintentional.

## 2019-12-16 NOTE — Progress Notes (Signed)
Subjective: Intubated. On Versed at 10 mg/hr.   Objective: Current vital signs: BP 131/75   Pulse 65   Temp 98.4 F (36.9 C) (Oral)   Resp 16   Ht 6' (1.829 m)   Wt 80.7 kg   SpO2 99%   BMI 24.13 kg/m  Vital signs in last 24 hours: Temp:  [97.7 F (36.5 C)-99.2 F (37.3 C)] 98.4 F (36.9 C) (12/05 0400) Pulse Rate:  [62-67] 65 (12/05 0900) Resp:  [13-22] 16 (12/05 0900) BP: (124-156)/(70-90) 131/75 (12/05 0900) SpO2:  [92 %-100 %] 99 % (12/05 0900) FiO2 (%):  [40 %-50 %] 40 % (12/05 0846) Weight:  [80.7 kg] 80.7 kg (12/04 1917)  Intake/Output from previous day: 12/04 0701 - 12/05 0700 In: 410 [I.V.:10; NG/GT:200; IV Piggyback:200] Out: 2450 [Urine:1250; Emesis/NG output:1200] Intake/Output this shift: Total I/O In: 120 [I.V.:20; IV Piggyback:100] Out: 125 [Urine:100; Emesis/NG output:25] Nutritional status:  Diet Order            Diet NPO time specified  Diet effective now           Diet - low sodium heart healthy                HEENT: [place]/AT Skin: Jaundice noted Ext: Warm and well perfused  Neurologic Exam: Ment: Intubated and sedated on Versed gtt at a rate of 10. Grimaces weakly to sternal rub. No eye opening or purposeful movements. No response to voice.  CN: Pinpoint pupils bilaterally. Eyes conjugate at the midline. Does not gaze towards or away from visual stimuli. No doll's eye reflex (sedated). Furrows brow to sternal rub.  Motor: Flaccid tone x 4. No movement of upper or lower extremities to light noxious stimuli.  Sensory: As above. Reflexes: Hypoactive throughout.    Lab Results: Results for orders placed or performed during the hospital encounter of 12/25/2019 (from the past 48 hour(s))  CBC     Status: Abnormal   Collection Time: 12/15/19  4:40 AM  Result Value Ref Range   WBC 6.4 4.0 - 10.5 K/uL   RBC 2.90 (L) 4.22 - 5.81 MIL/uL   Hemoglobin 10.5 (L) 13.0 - 17.0 g/dL   HCT 30.4 (L) 39 - 52 %   MCV 104.8 (H) 80.0 - 100.0 fL   MCH 36.2 (H)  26.0 - 34.0 pg   MCHC 34.5 30.0 - 36.0 g/dL   RDW 15.2 11.5 - 15.5 %   Platelets 109 (L) 150 - 400 K/uL    Comment: Immature Platelet Fraction may be clinically indicated, consider ordering this additional test [id number]    nRBC 0.0 0.0 - 0.2 %    Comment: Performed at [facility], [address] [place], [place] [address]  Ammonia     Status: Abnormal   Collection Time: 12/15/19  4:40 AM  Result Value Ref Range   Ammonia 67 (H) 9 - 35 umol/L    Comment: Performed at [facility], [address] [place], [place] [address]  Basic metabolic panel     Status: Abnormal   Collection Time: 12/15/19  4:40 AM  Result Value Ref Range   Sodium 142 135 - 145 mmol/L   Potassium 3.7 3.5 - 5.1 mmol/L   Chloride 110 98 - 111 mmol/L   CO2 21 (L) 22 - 32 mmol/L   Glucose, Bld 98 70 - 99 mg/dL    Comment: Glucose reference range applies only to samples taken after fasting for at least 8 hours.   BUN 10  6 - 20 mg/dL   Creatinine, Ser 0.62 0.61 - 1.24 mg/dL   Calcium 8.4 (L) 8.9 - 10.3 mg/dL   GFR, Estimated >60 >60 mL/min    Comment: (NOTE) Calculated using the CKD-EPI Creatinine Equation (2021)    Anion gap 11 5 - 15    Comment: Performed at [facility], [address] [place], [place] [address]  Magnesium     Status: Abnormal   Collection Time: 12/15/19  4:49 AM  Result Value Ref Range   Magnesium 1.6 (L) 1.7 - 2.4 mg/dL    Comment: Performed at [facility], [address], [place], [place] [address]  Draw ABG 1 hour after initiation of ventilator     Status: Abnormal (Preliminary result)   Collection Time: 12/15/19  6:58 PM  Result Value Ref Range   FIO2 0.50    Delivery systems VENTILATOR    Mode PRESSURE REGULATED VOLUME CONTROL    VT 500 mL   LHR 15 resp/min   Peep/cpap 5.0 cm H20   pH, Arterial 7.41 7.35 - 7.45   pCO2 arterial 35 32 - 48 mmHg   pO2, Arterial 110 (H) 83 - 108 mmHg   Bicarbonate 22.2 20.0 - 28.0 mmol/L   Acid-base  deficit 2.0 0.0 - 2.0 mmol/L   O2 Saturation 98.3 %   Patient temperature 37.0    Collection site REVIEWED BY    Sample type ARTERIAL DRAW    Allens test (pass/fail) PASS PASS    Comment: Performed at [facility], [address] [place], [place] [address]   Mechanical Rate PENDING   MRSA PCR Screening     Status: None   Collection Time: 12/15/19  7:04 PM   Specimen: Nasal Mucosa; Nasopharyngeal  Result Value Ref Range   MRSA by PCR NEGATIVE NEGATIVE    Comment:        The GeneXpert MRSA Assay (FDA approved for NASAL specimens only), is one component of a comprehensive MRSA colonization surveillance program. It is not intended to diagnose MRSA infection nor to guide or monitor treatment for MRSA infections. Performed at [facility], [address] [place], [place] [address]   Glucose, capillary     Status: None   Collection Time: 12/15/19  7:22 PM  Result Value Ref Range   Glucose-Capillary 96 70 - 99 mg/dL    Comment: Glucose reference range applies only to samples taken after fasting for at least 8 hours.  Magnesium     Status: Abnormal   Collection Time: 12/15/19 10:10 PM  Result Value Ref Range   Magnesium 2.5 (H) 1.7 - 2.4 mg/dL    Comment: Performed at [facility], [address] [place],  [address]  Glucose, capillary     Status: None   Collection Time: 12/15/19 11:34 PM  Result Value Ref Range   Glucose-Capillary 96 70 - 99 mg/dL    Comment: Glucose reference range applies only to samples taken after fasting for at least 8 hours.  Glucose, capillary     Status: None   Collection Time: 12/16/19  3:19 AM  Result Value Ref Range   Glucose-Capillary 89 70 - 99 mg/dL    Comment: Glucose reference range applies only to samples taken after fasting for at least 8 hours.  CBC     Status: Abnormal   Collection Time: 12/16/19  4:47 AM  Result Value Ref Range   WBC 10.2 4.0 - 10.5 K/uL   RBC 3.10 (L) 4.22 -  5.81 MIL/uL    Hemoglobin 11.2 (L) 13.0 - 17.0 g/dL   HCT 32.6 (L) 39 - 52 %   MCV 105.2 (H) 80.0 - 100.0 fL   MCH 36.1 (H) 26.0 - 34.0 pg   MCHC 34.4 30.0 - 36.0 g/dL   RDW 15.3 11.5 - 15.5 %   Platelets 92 (L) 150 - 400 K/uL    Comment: Immature Platelet Fraction may be clinically indicated, consider ordering this additional test [id number]    nRBC 0.0 0.0 - 0.2 %    Comment: Performed at [facility], [address] [place], [place] [address]  Ammonia     Status: Abnormal   Collection Time: 12/16/19  4:47 AM  Result Value Ref Range   Ammonia 54 (H) 9 - 35 umol/L    Comment: Performed at [facility], [address] [place], [place] [address]  Basic metabolic panel     Status: Abnormal   Collection Time: 12/16/19  4:47 AM  Result Value Ref Range   Sodium 141 135 - 145 mmol/L   Potassium 3.7 3.5 - 5.1 mmol/L   Chloride 110 98 - 111 mmol/L   CO2 24 22 - 32 mmol/L   Glucose, Bld 99 70 - 99 mg/dL    Comment: Glucose reference range applies only to samples taken after fasting for at least 8 hours.   BUN 18 6 - 20 mg/dL   Creatinine, Ser 0.69 0.61 - 1.24 mg/dL   Calcium 8.5 (L) 8.9 - 10.3 mg/dL   GFR, Estimated >60 >60 mL/min    Comment: (NOTE) Calculated using the CKD-EPI Creatinine Equation (2021)    Anion gap 7 5 - 15    Comment: Performed at [facility], [address] [place], [place] [address]  Magnesium     Status: None   Collection Time: 12/16/19  4:47 AM  Result Value Ref Range   Magnesium 2.4 1.7 - 2.4 mg/dL    Comment: Performed at [facility], [address] [place], [place] [address]  Glucose, capillary     Status: Abnormal   Collection Time: 12/16/19  8:37 AM  Result Value Ref Range   Glucose-Capillary 106 (H) 70 - 99 mg/dL    Comment: Glucose reference range applies only to samples taken after fasting for at least 8 hours.    Recent Results (from the past 240 hour(s))  Resp Panel by RT-PCR (Flu A&B, Covid) Nasopharyngeal Swab      Status: None   Collection Time: 01/04/2020  8:35 PM   Specimen: Nasopharyngeal Swab; Nasopharyngeal(NP) swabs in vial transport medium  Result Value Ref Range Status   SARS Coronavirus 2 by RT PCR NEGATIVE NEGATIVE Final    Comment: (NOTE) SARS-CoV-2 target nucleic acids are NOT DETECTED.  The SARS-CoV-2 RNA is generally detectable in upper respiratory specimens during the acute phase of infection. The lowest concentration of SARS-CoV-2 viral copies this assay can detect is 138 copies/mL. A negative result does not preclude SARS-Cov-2 infection and should not be used as the sole basis for treatment or other patient management decisions. A negative result may occur with  improper specimen collection/handling, submission of specimen other than nasopharyngeal swab, presence of viral mutation(s) within the areas targeted by this assay, and inadequate number of viral copies(<138 copies/mL). A negative result must be combined with clinical observations, patient history, and epidemiological information. The expected result is Negative.  Fact Sheet for Patients:  EntrepreneurPulse.com.au  Fact Sheet for Healthcare Providers:  IncredibleEmployment.be  This test is no t yet approved or cleared by the [place] and  has been authorized for detection and/or diagnosis of SARS-CoV-2 by FDA under an Emergency Use Authorization (EUA). This EUA will remain  in effect (meaning this test can be used) for the duration of the COVID-19 declaration under Section 564(b)(1) of the Act, 21 U.S.C.section 360bbb-3(b)(1), unless the authorization is terminated  or revoked sooner.       Influenza A by PCR NEGATIVE NEGATIVE Final   Influenza B by PCR NEGATIVE NEGATIVE Final    Comment: (NOTE) The Xpert Xpress SARS-CoV-2/FLU/RSV plus assay is intended as an aid in the diagnosis of influenza from Nasopharyngeal swab specimens and should not be used as a sole basis  for treatment. Nasal washings and aspirates are unacceptable for Xpert Xpress SARS-CoV-2/FLU/RSV testing.  Fact Sheet for Patients: EntrepreneurPulse.com.au  Fact Sheet for Healthcare Providers: IncredibleEmployment.be  This test is not yet approved or cleared by the [place] FDA and has been authorized for detection and/or diagnosis of SARS-CoV-2 by FDA under an Emergency Use Authorization (EUA). This EUA will remain in effect (meaning this test can be used) for the duration of the COVID-19 declaration under Section 564(b)(1) of the Act, 21 U.S.C. section 360bbb-3(b)(1), unless the authorization is terminated or revoked.  Performed at [facility], [address] [place], [place] [address]   MRSA PCR Screening     Status: None   Collection Time: 12/15/19  7:04 PM   Specimen: Nasal Mucosa; Nasopharyngeal  Result Value Ref Range Status   MRSA by PCR NEGATIVE NEGATIVE Final    Comment:        The GeneXpert MRSA Assay (FDA approved for NASAL specimens only), is one component of a comprehensive MRSA colonization surveillance program. It is not intended to diagnose MRSA infection nor to guide or monitor treatment for MRSA infections. Performed at [facility], [address] [place], [place] [address]     Lipid Panel No results for input(s): CHOL, TRIG, HDL, CHOLHDL, VLDL, LDLCALC in the last 72 hours.  Studies/Results: DG Abdomen 1 View  Result Date: 12/14/2019 CLINICAL DATA:  NG tube placement EXAM: ABDOMEN - 1 VIEW COMPARISON:  None. FINDINGS: The bowel gas pattern is normal. NG tube tip is seen within the proximal stomach. Side hole seen within the distal GE junction. No radio-opaque calculi or other significant radiographic abnormality are seen. IMPRESSION: NG tube tip seen just entering the proximal stomach and could be advanced several cm. Electronically Signed   By: Prudencio Pair M.D.   On: 12/14/2019 19:46    MR BRAIN W WO CONTRAST  Result Date: 12/16/2019 CLINICAL DATA:  Seizure EXAM: MRI HEAD WITHOUT AND WITH CONTRAST TECHNIQUE: Multiplanar, multiecho pulse sequences of the brain and surrounding structures were obtained without and with intravenous contrast. CONTRAST:  7.65m GADAVIST GADOBUTROL 1 MMOL/ML IV SOLN COMPARISON:  None. FINDINGS: Brain: There is cortical diffusion restriction along the cingulate gyri and within the posterior parietal lobe. No acute or chronic hemorrhage. There is multifocal hyperintense T2-weighted signal within the white matter. Parenchymal volume and CSF spaces are normal. There is hyperintense T1-weighted signal within the globi pallidi. Midline structures are normal. There is no abnormal contrast enhancement. Vascular: Major flow voids are preserved. Skull and upper cervical spine: Normal calvarium and skull base. Visualized upper cervical spine and soft tissues are normal. Sinuses/Orbits:No paranasal sinus fluid levels or advanced mucosal thickening. No mastoid or middle ear effusion. Normal orbits. IMPRESSION: 1. Cortical diffusion  restriction along the cingulate gyri and within the posterior parietal lobe. This may be a postictal phenomenon. Posterior reversible encephalopathy syndrome (PRES) is also a possibility. 2. No hemorrhage or mass effect. 3. Hyperintense T1-weighted signal within the globi pallidi, most commonly due to manganese deposition in the setting of chronic liver disease. Electronically Signed   By: Ulyses Jarred M.D.   On: 12/16/2019 01:07   DG Chest Port 1 View  Result Date: 12/15/2019 CLINICAL DATA:  Endotracheal tube placement EXAM: PORTABLE CHEST 1 VIEW COMPARISON:  November 23, 2019 FINDINGS: The endotracheal tube terminates above the carina by approximately 3.2 cm. The enteric tube extends below the left hemidiaphragm. The heart size is enlarged. The pulmonary vasculature is dilated centrally. There is vascular congestion without overt pulmonary  edema. There are old healed left-sided rib fractures. There is no pneumothorax. The patient is status post prior TIPS placement. IMPRESSION: 1. Lines and tubes as above. 2. Cardiomegaly with vascular congestion. No overt pulmonary edema. Electronically Signed   By: Constance Holster M.D.   On: 12/15/2019 19:15   [place] EKG SITE RITE  Result Date: 12/15/2019 If Site Rite image not attached, placement could not be confirmed due to current cardiac rhythm.   Medications:  Scheduled: . chlorhexidine gluconate (MEDLINE KIT)  15 mL Mouth Rinse BID  . Chlorhexidine Gluconate Cloth  6 each Topical Q0600  . feeding supplement  237 mL Per Tube BID BM  . furosemide  10 mg Intravenous BID  . furosemide  40 mg Intravenous Once  . ipratropium-albuterol  3 mL Nebulization Q6H  . lactulose  30 g Per Tube TID  . LORazepam  1 mg Intravenous Q6H  . LORazepam  2 mg Intravenous Q6H  . mouth rinse  15 mL Mouth Rinse 10 times per day  . multivitamin with minerals  1 tablet Per Tube Daily  . pantoprazole (PROTONIX) IV  40 mg Intravenous Q12H  . propranolol  20 mg Per Tube Daily  . rifaximin  550 mg Per Tube BID  . spironolactone  50 mg Per Tube BID  . vitamin E  400 Units Per Tube QPM  . zonisamide  400 mg Oral QHS   Continuous: . folic acid (FOLVITE) IVPB    . levETIRAcetam Stopped (12/16/19 0147)  . midazolam 10 mg/hr (12/16/19 0900)    Assessment:59 year old male with an extensive PMHx including alcoholic liver disease and epilepsy, presenting with breakthrough seizures. Overall impression is that his presentation was precipitated by acute alcohol withdrawal.  1. Exam is consistent with combined effects of sedation, postictal state and metabolic encephalopathy. Of note. Versed at a rate of 10 would be unlikely by itself to result in this level of obtundation. 2. MRI reveals cortical restricted diffusion in parietal lobes symmetrically; cortical edema from seizure is highest on the DDx and is also likely  contributing to his obtunded state. PRES is felt to be unlikely.  3. Hyperammonemia.   Recommendations: 1. Continue Versed at a rate of 10. Versed is with dual effect: Seizure suppression as well as treatment of EtOH withdrawal. The equivalent benzodiazepine dose for Ativan would be 5 mg/hr, therefore the current rate of Versed is good coverage for EtOH withdrawal in a patient with a history of heavy EtOH use.  2. ContinueKeppra at 1562m BID IV  3. ContinueZonegran 400 mg QHS via NGT. Was able to receive Zonegran per tube yesterday evening.  4. Monitoring and treatment of his hyperammonemia. 5. Continue scheduled Ativan 2 mg IV q6h  x 4 doses, then 1 mg IV q6h x 8 doses has been ordered. When Versed is tapered off, Ativan will provide coverage for EtOH withdrawal.  6. Wife declines transfer to [facility]. She would prefer transfer to [facility]. Will hold off on transfer for now as the transfer process for an intubated patient does present some risks and he has had no evidence for recurrence of seizure activity on RN neuro checks and physician exams since Versed was started.  7. EEG on Monday morning (ordered).  8. Discussed with Dr. Patsey Berthold.   35 minutes spent in the neurological evaluation and management of this critically ill patient.    LOS: 3 days   '@Electronically'  signed: Dr. Kerney Elbe 12/16/2019  9:47 AM

## 2019-12-17 ENCOUNTER — Inpatient Hospital Stay: Payer: Medicare HMO

## 2019-12-17 DIAGNOSIS — J9601 Acute respiratory failure with hypoxia: Secondary | ICD-10-CM

## 2019-12-17 DIAGNOSIS — C220 Liver cell carcinoma: Secondary | ICD-10-CM

## 2019-12-17 DIAGNOSIS — K7290 Hepatic failure, unspecified without coma: Secondary | ICD-10-CM | POA: Diagnosis not present

## 2019-12-17 LAB — AFP TUMOR MARKER: AFP, Serum, Tumor Marker: 12.4 ng/mL — ABNORMAL HIGH (ref 0.0–8.3)

## 2019-12-17 LAB — MAGNESIUM: Magnesium: 1.9 mg/dL (ref 1.7–2.4)

## 2019-12-17 LAB — GLUCOSE, CAPILLARY
Glucose-Capillary: 107 mg/dL — ABNORMAL HIGH (ref 70–99)
Glucose-Capillary: 114 mg/dL — ABNORMAL HIGH (ref 70–99)
Glucose-Capillary: 114 mg/dL — ABNORMAL HIGH (ref 70–99)
Glucose-Capillary: 90 mg/dL (ref 70–99)
Glucose-Capillary: 93 mg/dL (ref 70–99)
Glucose-Capillary: 95 mg/dL (ref 70–99)
Glucose-Capillary: 96 mg/dL (ref 70–99)

## 2019-12-17 LAB — AMMONIA: Ammonia: 100 umol/L — ABNORMAL HIGH (ref 9–35)

## 2019-12-17 LAB — PHOSPHORUS: Phosphorus: 2.6 mg/dL (ref 2.5–4.6)

## 2019-12-17 MED ORDER — SODIUM CHLORIDE 0.9 % IV SOLN
1.0000 g | Freq: Every day | INTRAVENOUS | Status: DC
  Administered 2019-12-17 – 2019-12-18 (×2): 1 g via INTRAVENOUS
  Filled 2019-12-17 (×2): qty 10
  Filled 2019-12-17: qty 1

## 2019-12-17 MED ORDER — PROSOURCE TF PO LIQD
90.0000 mL | Freq: Two times a day (BID) | ORAL | Status: DC
  Administered 2019-12-17 – 2019-12-18 (×2): 90 mL
  Filled 2019-12-17: qty 90

## 2019-12-17 MED ORDER — VITAL 1.5 CAL PO LIQD
1000.0000 mL | ORAL | Status: DC
  Administered 2019-12-17: 1000 mL

## 2019-12-17 MED ORDER — VITAL HIGH PROTEIN PO LIQD: 1000.0000 mL | ORAL | Status: DC

## 2019-12-17 NOTE — Progress Notes (Signed)
Initial Nutrition Assessment  DOCUMENTATION CODES:   Non-severe (moderate) malnutrition in context of chronic illness  INTERVENTION:  Once NGT placement confirmed with abdominal x-ray initiate Vital 1.5 Cal at 20 mL/hr and advance by 15 mL/hr every 12 hours to goal rate of 50 mL/hr. Also provide PROSource TF  90 mL BID per tube. Provides 1960 kcal, 125 grams of protein, 912 mL H2O daily.  Monitor magnesium, potassium, and phosphorus daily for at least 3 days, MD to replete as needed, as pt is at risk for refeeding syndrome given moderate malnutrition.  NUTRITION DIAGNOSIS:   Moderate Malnutrition related to chronic illness (EtOH abuse, cirrhosis, COPD, metastatic hepatocellular carcinoma) as evidenced by moderate muscle depletion, moderate fat depletion.  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   12/4 intubated  Patient is currently intubated on ventilator support MV: 12 L/min Temp (24hrs), Avg:99 F (37.2 C), Min:98.7 F (37.1 C), Max:99.4 F (37.4 C)  Medications reviewed and include: Lasix 10 mg BID IV, lactulose 30 grams TID, Ativan, MVI daily, Protonix, spironolactone, vitamin E 400 units daily, ceftriaxone, Keppra, Versed gtt.  Labs reviewed: CBG 93-114.  I/O: 1225 mL UOP yesterday (0.6 mL/kg/hr)  Enteral Access: 18 Fr. NGT placed 12/3; tube was advanced by RN and pending repeat x-ray  Discussed with RN and on rounds. Previous x-ray from 12/4 stated tube should be advanced  several cm (was at 60 cm at placement) and now tube is only at 57 cm. Plan is to advance OGT and repeat abdominal x-ray then initiate tube feeds.  NUTRITION - FOCUSED PHYSICAL EXAM:    Most Recent Value  Orbital Region Moderate depletion  Upper Arm Region Moderate depletion  Thoracic and Lumbar Region Mild depletion  Buccal Region Unable to assess  Temple Region Moderate depletion  Clavicle Bone Region Moderate depletion  Clavicle and Acromion Bone Region Moderate depletion  Scapular Bone Region Unable to assess  Dorsal Hand Moderate depletion  Patellar Region Moderate depletion  Anterior Thigh Region Moderate depletion  Posterior Calf Region Unable to assess  Edema (RD Assessment) --  [non pitting]  Hair Reviewed  Eyes Unable to assess  Mouth Unable to assess  Skin Reviewed  Nails Reviewed     Diet Order:   Diet Order            Diet NPO time specified  Diet effective now           Diet - low sodium heart healthy                EDUCATION NEEDS:   No education needs have been identified at this time  Skin:  Skin Assessment: Reviewed RN Assessment  Last BM:  12/14/2019 per chart  Height:   Ht Readings from Last 1 Encounters:  12/15/19 6' (1.829 m)   Weight:   Wt Readings from Last 1 Encounters:  12/15/19 80.7 kg   Ideal Body Weight:  80.9 kg  BMI:  Body mass index is 24.13 kg/m.  Estimated Nutritional Needs:   Kcal:  1998 (PSU 2003b  w/ MSJ 1659, Ve 12, Tmax 37.4)  Protein:  115-125 grams  Fluid:  2 L/day  Jacklynn Barnacle, MS, RD, LDN Pager number available on Amion

## 2019-12-17 NOTE — Progress Notes (Incomplete)
NEUROLOGY CONSULTATION PROGRESS NOTE   Date of service: December 17, 2019 Patient Name: Adrian Hale MRN:  [id number] DOB:  17-Oct-1960  Brief HPI  Adrian Hale is a 59 year old male with an extensive PMHx including alcoholic liver disease and epilepsy, presenting with breakthrough seizures. Overall impression is that his presentation was precipitated by acute alcohol withdrawal. Workup with MRI Brain demonstrated cortical restricted diffusion in parietal lobes symmetrically; cortical edema from seizure is highest on the DDx and is also likely contributing to his obtunded state. PRES is felt to be unlikely.  On Versed, Zonegran 400mg  QHS and Keppra 1500mg  BID IV.  Interval Hx   EEG is pending.  Vitals   Vitals:   12/17/19 0600 12/17/19 0812 12/17/19 0900 12/17/19 1000  BP: 120/69  122/72 118/71  Pulse: 72  73 73  Resp: 18  18 (!) 21  Temp:      TempSrc:      SpO2: 96% 98% 95% 95%  Weight:      Height:         Body mass index is 24.13 kg/m.  Physical Exam   General: Laying comfortably in bed; in no acute distress. *** HENT: Normal oropharynx and mucosa. Normal external appearance of ears and nose. *** Neck: Supple, no pain or tenderness *** CV: No JVD. No peripheral edema. *** Pulmonary: Symmetric Chest rise. Normal respiratory effort. *** Abdomen: Soft to touch, non-tender. *** Ext: No cyanosis, edema, or deformity *** Skin: No rash. Normal palpation of skin.  *** Musculoskeletal: Normal digits and nails by inspection. No clubbing. ***  Neurologic Examination  Mental status/Cognition: Alert, oriented to self, place, month and year, good attention. *** Speech/language: Fluent, comprehension intact, object naming intact, repetition intact. *** Cranial nerves:   CN II Pupils equal and reactive to light, no VF deficits ***   CN III,IV,VI EOM intact, no gaze preference or deviation, no nystagmus ***   CN V normal sensation in V1, V2, and V3 segments bilaterally  ***   CN VII no asymmetry, no nasolabial fold flattening ***   CN VIII normal hearing to speech ***   CN IX & X normal palatal elevation, no uvular deviation ***   CN XI 5/5 head turn and 5/5 shoulder shrug bilaterally ***   CN XII midline tongue protrusion ***   Motor:  Muscle bulk: ***, tone ***, pronator drift *** tremor *** Mvmt Root Nerve  Muscle Right Left Comments  SA C5/6 Ax Deltoid     EF C5/6 Mc Biceps     EE C6/7/8 Rad Triceps     WF C6/7 Med FCR     WE C7/8 PIN ECU     F Ab C8/T1 U ADM/FDI     HF L1/2/3 Fem Illopsoas     KE L2/3/4 Fem Quad     DF L4/5 D Peron Tib Ant     PF S1/2 Tibial Grc/Sol      Reflexes:  Right Left Comments  Pectoralis      Biceps (C5/6)     Brachioradialis (C5/6)      Triceps (C6/7)      Patellar (L3/4)      Achilles (S1)      Hoffman      Plantar     Jaw jerk    Sensation:  Light touch    Pin prick    Temperature    Vibration   Proprioception    Coordination/Complex Motor:  - Finger to Nose *** -  Heel to shin *** - Rapid alternating movement *** - Gait: Stride length ***. Arm swing ***. Base width ***  Labs   Basic Metabolic Panel:  Lab Results  Component Value Date   NA 141 12/16/2019   K 3.7 12/16/2019   CO2 24 12/16/2019   GLUCOSE 99 12/16/2019   BUN 18 12/16/2019   CREATININE 0.69 12/16/2019   CALCIUM 8.5 (L) 12/16/2019   GFRNONAA >60 12/16/2019   GFRAA >60 07/13/2019   HbA1c: No results found for: HGBA1C LDL: No results found for: [facility] Urine Drug Screen:     Component Value Date/Time   LABOPIA NONE DETECTED 01/02/2020 1911   COCAINSCRNUR NONE DETECTED 12/15/2019 1911   LABBENZ POSITIVE (A) 12/28/2019 1911   AMPHETMU NONE DETECTED 12/25/2019 1911   THCU POSITIVE (A) 12/12/2019 1911   LABBARB NONE DETECTED 12/23/2019 1911    Alcohol Level     Component Value Date/Time   ETH <10 12/25/2019 1830   Lab Results  Component Value Date   PHENYTOIN 8.2 (L) 07/13/2011   Lab Results  Component Value  Date   PHENYTOIN 8.2 (L) 07/13/2011    Imaging and Diagnostic studies  Results for orders placed during the hospital encounter of 12/30/2019  CT Head Wo Contrast  Narrative CLINICAL DATA:  Seizure. Tonic colonic seizure lasting approximately 15-20 minutes.  EXAM: CT HEAD WITHOUT CONTRAST  TECHNIQUE: Contiguous axial images were obtained from the base of the skull through the vertex without intravenous contrast.  COMPARISON:  07/11/2019  FINDINGS: Brain: No evidence of acute infarction, hemorrhage, hydrocephalus, extra-axial collection or mass lesion/mass effect. Mild prominence of the sulci and ventricles.  Vascular: No hyperdense vessel or unexpected calcification.  Skull: Normal. Negative for fracture or focal lesion.  Sinuses/Orbits: No acute finding.  Other: None.  IMPRESSION: 1. No acute intracranial abnormalities. 2. Mild brain atrophy.   Electronically Signed By: Kerby Moors M.D. On: 12/14/2019 19:19   Impression   Adrian Hale is a 59 y.o. male with PMH significant for ***. His neurologic examination is notable for ***.  Recommendations  *** ______________________________________________________________________   Thank you for the opportunity to take part in the care of this patient. If you have any further questions, please contact the neurology consultation attending.  Signed,  [place] Pager Number [phone]

## 2019-12-17 NOTE — Procedures (Addendum)
Patient Name: Adrian Hale  MRN: [id number]  Epilepsy Attending: Lora Havens  Referring Physician/Provider: Dr Annice Pih Date: 12/17/2019 Duration: 29 mins  Patient history: 59 year old male with an extensive PMHx including alcoholic liver disease and epilepsy, presenting with breakthrough seizures. EEG to evaluate for seizure.  Level of alertness:  comatose  AEDs during EEG study: ZNS, LEV, Versed, Ativan  Technical aspects: This EEG study was done with scalp electrodes positioned according to the 10-20 International system of electrode placement. Electrical activity was acquired at a sampling rate of 500Hz  and reviewed with a high frequency filter of 70Hz  and a low frequency filter of 1Hz . EEG data were recorded continuously and digitally stored.   Description: EEG showed continuous generalized polymorphic mixed frequencies with predominantly 5-8hz  theta-alpha activity as well as 2-3hz  delta slowing. Sharp transient was seen in right cenrotemporal region. Hyperventilation and photic stimulation were not performed.   ABNORMALITY -Continuous slow, generalized  IMPRESSION: This study is suggestive of severe diffuse encephalopathy, nonspecific etiology but could be related to sedation. No seizures or epileptiform discharges were seen throughout the recording.  Averyanna Sax Barbra Sarks

## 2019-12-17 NOTE — Progress Notes (Signed)
CRITICAL CARE NOTE 59 y.o. male,current smoker (tobacco/marijuana) with a history of alcoholic cirrhosis, portal hypertension (status post TIPS),seizure disorder and metastatic hepatocellular carcinoma who presented to [facility] on 2 December because of breakthrough seizures.  Patient developed status epilepticus and had to be transferred to the ICU and intubated for Versed infusion.  PCCM asked to assume care in ICU.  Events: 12/15/2019: Admitted to ICU in status epilepticus, intubated 12/16/2019: Transfer to [facility] canceled per wife's request patient's condition has stabilized, will continue to treat at [facility]    CC  follow up respiratory failure  SUBJECTIVE Patient remains critically ill Prognosis is guarded   BP 120/69   Pulse 72   Temp 98.7 F (37.1 C) (Oral)   Resp 18   Ht 6' (1.829 m)   Wt 80.7 kg   SpO2 96%   BMI 24.13 kg/m    I/O last 3 completed shifts: In: 541.2 [I.V.:241.5; IV Piggyback:299.7] Out: 1600 [IWPYK:9983; Emesis/NG output:25] No intake/output data recorded.  SpO2: 96 % O2 Flow Rate (L/min): 2 L/min FiO2 (%): 40 %  Estimated body mass index is 24.13 kg/m as calculated from the following:   Height as of this encounter: 6' (1.829 m).   Weight as of this encounter: 80.7 kg.  SIGNIFICANT EVENTS   REVIEW OF SYSTEMS  PATIENT IS UNABLE TO PROVIDE COMPLETE REVIEW OF SYSTEMS DUE TO SEVERE CRITICAL ILLNESS        PHYSICAL EXAMINATION:  GENERAL:critically ill appearing, +resp distress HEAD: Normocephalic, atraumatic.  EYES: Pupils equal, round, reactive to light.  No scleral icterus.  MOUTH: Moist mucosal membrane. NECK: Supple.  PULMONARY: +rhonchi, +wheezing CARDIOVASCULAR: S1 and S2. Regular rate and rhythm. No murmurs, rubs, or gallops.  GASTROINTESTINAL: Soft, nontender, -distended.  Positive bowel sounds.   MUSCULOSKELETAL: No swelling, clubbing, or edema.  NEUROLOGIC: obtunded, GCS<8 SKIN:intact,warm,dry  MEDICATIONS: I have reviewed  all medications and confirmed regimen as documented   CULTURE RESULTS   Recent Results (from the past 240 hour(s))  Resp Panel by RT-PCR (Flu A&B, Covid) Nasopharyngeal Swab     Status: None   Collection Time: 12/29/2019  8:35 PM   Specimen: Nasopharyngeal Swab; Nasopharyngeal(NP) swabs in vial transport medium  Result Value Ref Range Status   SARS Coronavirus 2 by RT PCR NEGATIVE NEGATIVE Final    Comment: (NOTE) SARS-CoV-2 target nucleic acids are NOT DETECTED.  The SARS-CoV-2 RNA is generally detectable in upper respiratory specimens during the acute phase of infection. The lowest concentration of SARS-CoV-2 viral copies this assay can detect is 138 copies/mL. A negative result does not preclude SARS-Cov-2 infection and should not be used as the sole basis for treatment or other patient management decisions. A negative result may occur with  improper specimen collection/handling, submission of specimen other than nasopharyngeal swab, presence of viral mutation(s) within the areas targeted by this assay, and inadequate number of viral copies(<138 copies/mL). A negative result must be combined with clinical observations, patient history, and epidemiological information. The expected result is Negative.  Fact Sheet for Patients:  EntrepreneurPulse.com.au  Fact Sheet for Healthcare Providers:  IncredibleEmployment.be  This test is no t yet approved or cleared by the [place] FDA and  has been authorized for detection and/or diagnosis of SARS-CoV-2 by FDA under an Emergency Use Authorization (EUA). This EUA will remain  in effect (meaning this test can be used) for the duration of the COVID-19 declaration under Section 564(b)(1) of the Act, 21 U.S.C.section 360bbb-3(b)(1), unless the authorization is terminated  or revoked  sooner.       Influenza A by PCR NEGATIVE NEGATIVE Final   Influenza B by PCR NEGATIVE NEGATIVE Final    Comment:  (NOTE) The Xpert Xpress SARS-CoV-2/FLU/RSV plus assay is intended as an aid in the diagnosis of influenza from Nasopharyngeal swab specimens and should not be used as a sole basis for treatment. Nasal washings and aspirates are unacceptable for Xpert Xpress SARS-CoV-2/FLU/RSV testing.  Fact Sheet for Patients: EntrepreneurPulse.com.au  Fact Sheet for Healthcare Providers: IncredibleEmployment.be  This test is not yet approved or cleared by the [place] FDA and has been authorized for detection and/or diagnosis of SARS-CoV-2 by FDA under an Emergency Use Authorization (EUA). This EUA will remain in effect (meaning this test can be used) for the duration of the COVID-19 declaration under Section 564(b)(1) of the Act, 21 U.S.C. section 360bbb-3(b)(1), unless the authorization is terminated or revoked.  Performed at [facility], [address] [place], [place] [address]   MRSA PCR Screening     Status: None   Collection Time: 12/15/19  7:04 PM   Specimen: Nasal Mucosa; Nasopharyngeal  Result Value Ref Range Status   MRSA by PCR NEGATIVE NEGATIVE Final    Comment:        The GeneXpert MRSA Assay (FDA approved for NASAL specimens only), is one component of a comprehensive MRSA colonization surveillance program. It is not intended to diagnose MRSA infection nor to guide or monitor treatment for MRSA infections. Performed at [facility], [address] [place], [place] [address]       CBC    Component Value Date/Time   WBC 10.2 12/16/2019 0447   RBC 3.10 (L) 12/16/2019 0447   HGB 11.2 (L) 12/16/2019 0447   HGB 12.6 (L) 11/10/2013 0640   HCT 32.6 (L) 12/16/2019 0447   HCT 36.3 (L) 11/10/2013 0640   PLT 92 (L) 12/16/2019 0447   PLT 108 (L) 11/10/2013 0640   MCV 105.2 (H) 12/16/2019 0447   MCV 96 11/10/2013 0640   MCH 36.1 (H) 12/16/2019 0447   MCHC 34.4 12/16/2019 0447   RDW 15.3 12/16/2019 0447   RDW  14.1 11/10/2013 0640   LYMPHSABS 0.8 01/11/2020 1830   LYMPHSABS 2.6 11/10/2013 0640   MONOABS 0.5 01/08/2020 1830   MONOABS 1.2 (H) 11/10/2013 0640   EOSABS 0.2 01/10/2020 1830   EOSABS 0.1 11/10/2013 0640   BASOSABS 0.0 12/26/2019 1830   BASOSABS 0.1 11/10/2013 0640   BMP Latest Ref Rng & Units 12/16/2019 12/15/2019 12/14/2019  Glucose 70 - 99 mg/dL 99 98 97  BUN 6 - 20 mg/dL 18 10 8   Creatinine 0.61 - 1.24 mg/dL 0.69 0.62 0.55(L)  Sodium 135 - 145 mmol/L 141 142 138  Potassium 3.5 - 5.1 mmol/L 3.7 3.7 4.1  Chloride 98 - 111 mmol/L 110 110 109  CO2 22 - 32 mmol/L 24 21(L) 21(L)  Calcium 8.9 - 10.3 mg/dL 8.5(L) 8.4(L) 8.3(L)         Indwelling Urinary Catheter continued, requirement due to   Reason to continue Indwelling Urinary Catheter strict Intake/Output monitoring for hemodynamic instability         Ventilator continued, requirement due to severe respiratory failure   Ventilator Sedation RASS 0 to -2      ASSESSMENT AND PLAN SYNOPSIS   Severe ACUTE Hypoxic and Hypercapnic Respiratory Failure Status epilepticus, known seizure disorder with breakthrough seizures Breakthrough on 2 anticonvulsants Recent alcohol binge, patient with metastatic liver cancer  -continue Full MV support -continue  Bronchodilator Therapy -Wean Fio2 and PEEP as tolerated -VAP/VENT bundle implementation    Morbid obesity, possible OSA.   Will certainly impact respiratory mechanics, ventilator weaning Suspect will need to consider additional PEEP    NEUROLOGY Acute toxic metabolic encephalopathy, need for sedation Goal RASS -2 to -3 NEURO consulted   CARDIAC ICU monitoring  ID -continue IV abx as prescibed -follow up cultures  GI GI PROPHYLAXIS as indicated  NUTRITIONAL STATUS Nutrition Status:       DIET-->TF's as tolerated Constipation protocol as indicated  ENDO - will use ICU hypoglycemic\Hyperglycemia protocol if indicated     ELECTROLYTES -follow  labs as needed -replace as needed -pharmacy consultation and following   DVT/GI PRX ordered and assessed TRANSFUSIONS AS NEEDED MONITOR FSBS I Assessed the need for Labs I Assessed the need for Foley I Assessed the need for Central Venous Line Family Discussion when available I Assessed the need for Mobilization I made an Assessment of medications to be adjusted accordingly Safety Risk assessment completed   CASE DISCUSSED IN MULTIDISCIPLINARY ROUNDS WITH ICU TEAM  Critical Care Time devoted to patient care services described in this note is 45  minutes.   Overall, patient is critically ill, prognosis is guarded.  Patient with Multiorgan failure and at high risk for cardiac arrest and death.   Palliative care consulted   Corrin Parker, M.D.  [facility]  Medical Director [place] Director [facility] Cardio-Pulmonary Department

## 2019-12-17 NOTE — Progress Notes (Signed)
eeg done °

## 2019-12-18 DIAGNOSIS — J9601 Acute respiratory failure with hypoxia: Secondary | ICD-10-CM | POA: Diagnosis not present

## 2019-12-18 DIAGNOSIS — E440 Moderate protein-calorie malnutrition: Secondary | ICD-10-CM

## 2019-12-18 DIAGNOSIS — F1020 Alcohol dependence, uncomplicated: Secondary | ICD-10-CM | POA: Diagnosis not present

## 2019-12-18 DIAGNOSIS — G40909 Epilepsy, unspecified, not intractable, without status epilepticus: Secondary | ICD-10-CM | POA: Diagnosis not present

## 2019-12-18 DIAGNOSIS — K7290 Hepatic failure, unspecified without coma: Secondary | ICD-10-CM | POA: Diagnosis not present

## 2019-12-18 DIAGNOSIS — Z01818 Encounter for other preprocedural examination: Secondary | ICD-10-CM

## 2019-12-18 LAB — COMPREHENSIVE METABOLIC PANEL
ALT: 26 U/L (ref 0–44)
AST: 40 U/L (ref 15–41)
Albumin: 2.6 g/dL — ABNORMAL LOW (ref 3.5–5.0)
Alkaline Phosphatase: 134 U/L — ABNORMAL HIGH (ref 38–126)
Anion gap: 6 (ref 5–15)
BUN: 21 mg/dL — ABNORMAL HIGH (ref 6–20)
CO2: 26 mmol/L (ref 22–32)
Calcium: 8.6 mg/dL — ABNORMAL LOW (ref 8.9–10.3)
Chloride: 110 mmol/L (ref 98–111)
Creatinine, Ser: 0.6 mg/dL — ABNORMAL LOW (ref 0.61–1.24)
GFR, Estimated: >60 mL/min (ref >60)
Glucose, Bld: 119 mg/dL — ABNORMAL HIGH (ref 70–99)
Potassium: 3.7 mmol/L (ref 3.5–5.1)
Sodium: 142 mmol/L (ref 135–145)
Total Bilirubin: 1.9 mg/dL — ABNORMAL HIGH (ref 0.3–1.2)
Total Protein: 5.6 g/dL — ABNORMAL LOW (ref 6.5–8.1)

## 2019-12-18 LAB — GLUCOSE, CAPILLARY
Glucose-Capillary: 100 mg/dL — ABNORMAL HIGH (ref 70–99)
Glucose-Capillary: 94 mg/dL (ref 70–99)
Glucose-Capillary: 118 mg/dL — ABNORMAL HIGH (ref 70–99)
Glucose-Capillary: 101 mg/dL — ABNORMAL HIGH (ref 70–99)
Glucose-Capillary: 109 mg/dL — ABNORMAL HIGH (ref 70–99)

## 2019-12-18 LAB — CBC
HCT: 33.1 % — ABNORMAL LOW (ref 39.0–52.0)
Hemoglobin: 11.4 g/dL — ABNORMAL LOW (ref 13.0–17.0)
MCH: 36.3 pg — ABNORMAL HIGH (ref 26.0–34.0)
MCHC: 34.4 g/dL (ref 30.0–36.0)
MCV: 105.4 fL — ABNORMAL HIGH (ref 80.0–100.0)
Platelets: 80 10*3/uL — ABNORMAL LOW (ref 150–400)
RBC: 3.14 MIL/uL — ABNORMAL LOW (ref 4.22–5.81)
RDW: 15.1 % (ref 11.5–15.5)
WBC: 8.8 10*3/uL (ref 4.0–10.5)
nRBC: 0 % (ref 0.0–0.2)

## 2019-12-18 LAB — PHOSPHORUS: Phosphorus: 3.2 mg/dL (ref 2.5–4.6)

## 2019-12-18 LAB — MAGNESIUM: Magnesium: 1.9 mg/dL (ref 1.7–2.4)

## 2019-12-18 LAB — AMMONIA: Ammonia: 66 umol/L — ABNORMAL HIGH (ref 9–35)

## 2019-12-18 LAB — PROTIME-INR
INR: 1.4 — ABNORMAL HIGH (ref 0.8–1.2)
Prothrombin Time: 16.2 seconds — ABNORMAL HIGH (ref 11.4–15.2)

## 2019-12-18 MED ORDER — MORPHINE 100MG IN NS 100ML (1MG/ML) PREMIX INFUSION
5.0000 mg/h | INTRAVENOUS | Status: DC
  Administered 2019-12-18 (×2): 5 mg/h via INTRAVENOUS
  Filled 2019-12-18 (×2): qty 100

## 2019-12-18 NOTE — Progress Notes (Signed)
GOALS OF CARE DISCUSSION  The Clinical status was relayed to family in detail-Wife  Updated and notified of patients medical condition.  Patient remains unresponsive and will not open eyes to command.   Upon assessment his breath sounds are course crackles with significant secretions to oral pharyngeal region.    patient with increased WOB and using accessory muscles to breathe Explained to family course of therapy and the modalities     Patient with Progressive multiorgan failure with very low chance of meaningful recovery despite all aggressive and optimal medical therapy. Patient is in the Dying  Process associated with Suffering.  Family understands the situation.  Family are satisfied with Plan of action and management. All questions answered  Additional CC time 32 mins   Aveena Bari Patricia Pesa, M.D.  [facility]  Medical Director [place] Director [facility] Cardio-Pulmonary Department

## 2019-12-18 NOTE — Progress Notes (Signed)
Chaplain provided supportive presence "Adrian Hale" and wife prior to extabation and following. Wife shared a life review including joys and grief. She has hopes she could take Adrian Hale home for his last days. She reports having support from her brother and her children. David's family is not involved. Chaplain offered supportive presence regarding funeral homes and services. Prayer and blessing were offered with Adrian Hale.    12/18/19 1900  Clinical Encounter Type  Visited With Patient and family together  Visit Type Initial;Critical Care  Referral From Nurse  Spiritual Encounters  Spiritual Needs Prayer;Emotional;Grief support

## 2019-12-18 NOTE — Progress Notes (Signed)
GOALS OF CARE DISCUSSION  The Clinical status was relayed to family in detail. Wife, brother in law at bedside Wife has reached out to rest of family as well.  Updated and notified of patients medical condition.  Patient remains unresponsive and will not open eyes to command.   Upon assessment his breath sounds are course crackles with significant secretions to oral pharyngeal region.  Nasopharyngeal suction produced copious sanguineous secretions.    Patient is having a weak cough and struggling to remove secretions.   patient with increased WOB and using accessory muscles to breathe Explained to family course of therapy and the modalities     Patient with Progressive multiorgan failure with very low chance of meaningful recovery despite all aggressive and optimal medical therapy. Patient is in the Dying  Process associated with Suffering.  Family understands the situation.  They have consented and agreed to DNR/DNI and plan for one way extubation. If he fails, then will proceed with morphine infusion and comfort care measures.  Family are satisfied with Plan of action and management. All questions answered  Additional CC time 35 mins   Adrian Hale Patricia Pesa, M.D.  [facility]  Medical Director [place] Director [facility] Cardio-Pulmonary Department

## 2019-12-18 NOTE — Progress Notes (Signed)
CRITICAL CARE NOTE 59 y.o.male,current smoker (tobacco/marijuana) with a history of alcoholic cirrhosis, portal hypertension (status post TIPS),seizure disorder andmetastatic hepatocellular carcinomawho presented to [facility] on 2December because of breakthrough seizures.Patient developed status epilepticus and had to be transferred to the ICU and intubated for Versed infusion. PCCM asked to assume care in ICU.  Events: 12/15/2019: Admitted to ICU in status epilepticus, intubated 12/16/2019: Transfer to [facility] canceled per wife's request patient's condition has stabilized, will continue to treat at [facility] 12/6 remains encephlopathic   CC  follow up respiratory failure  SUBJECTIVE Patient remains critically ill Prognosis is guarded   BP 103/63   Pulse 65   Temp 99.1 F (37.3 C) (Oral)   Resp 19   Ht 6' 0.01" (1.829 m)   Wt 77.6 kg   SpO2 96%   BMI 23.20 kg/m    I/O last 3 completed shifts: In: 721.2 [I.V.:351.2; NG/GT:270; IV Piggyback:100] Out: 0973 [Urine:1075; Emesis/NG output:660] No intake/output data recorded.  SpO2: 96 % O2 Flow Rate (L/min): 2 L/min FiO2 (%): 30 %  Estimated body mass index is 23.2 kg/m as calculated from the following:   Height as of this encounter: 6' 0.01" (1.829 m).   Weight as of this encounter: 77.6 kg.  SIGNIFICANT EVENTS   REVIEW OF SYSTEMS  PATIENT IS UNABLE TO PROVIDE COMPLETE REVIEW OF SYSTEMS DUE TO SEVERE CRITICAL ILLNESS        PHYSICAL EXAMINATION:  GENERAL:critically ill appearing, +resp distress HEAD: Normocephalic, atraumatic.  EYES: Pupils equal, round, reactive to light.  No scleral icterus.  MOUTH: Moist mucosal membrane. NECK: Supple.  PULMONARY: +rhonchi, +wheezing CARDIOVASCULAR: S1 and S2. Regular rate and rhythm. No murmurs, rubs, or gallops.  GASTROINTESTINAL: Soft, nontender, -distended.  Positive bowel sounds.   MUSCULOSKELETAL: No swelling, clubbing, or edema.  NEUROLOGIC: obtunded,  GCS<8 SKIN:intact,warm,dry  MEDICATIONS: I have reviewed all medications and confirmed regimen as documented   CULTURE RESULTS   Recent Results (from the past 240 hour(s))  Resp Panel by RT-PCR (Flu A&B, Covid) Nasopharyngeal Swab     Status: None   Collection Time: 12/22/2019  8:35 PM   Specimen: Nasopharyngeal Swab; Nasopharyngeal(NP) swabs in vial transport medium  Result Value Ref Range Status   SARS Coronavirus 2 by RT PCR NEGATIVE NEGATIVE Final    Comment: (NOTE) SARS-CoV-2 target nucleic acids are NOT DETECTED.  The SARS-CoV-2 RNA is generally detectable in upper respiratory specimens during the acute phase of infection. The lowest concentration of SARS-CoV-2 viral copies this assay can detect is 138 copies/mL. A negative result does not preclude SARS-Cov-2 infection and should not be used as the sole basis for treatment or other patient management decisions. A negative result may occur with  improper specimen collection/handling, submission of specimen other than nasopharyngeal swab, presence of viral mutation(s) within the areas targeted by this assay, and inadequate number of viral copies(<138 copies/mL). A negative result must be combined with clinical observations, patient history, and epidemiological information. The expected result is Negative.  Fact Sheet for Patients:  EntrepreneurPulse.com.au  Fact Sheet for Healthcare Providers:  IncredibleEmployment.be  This test is no t yet approved or cleared by the [place] FDA and  has been authorized for detection and/or diagnosis of SARS-CoV-2 by FDA under an Emergency Use Authorization (EUA). This EUA will remain  in effect (meaning this test can be used) for the duration of the COVID-19 declaration under Section 564(b)(1) of the Act, 21 U.S.C.section 360bbb-3(b)(1), unless the authorization is terminated  or revoked sooner.  Influenza A by PCR NEGATIVE NEGATIVE Final    Influenza B by PCR NEGATIVE NEGATIVE Final    Comment: (NOTE) The Xpert Xpress SARS-CoV-2/FLU/RSV plus assay is intended as an aid in the diagnosis of influenza from Nasopharyngeal swab specimens and should not be used as a sole basis for treatment. Nasal washings and aspirates are unacceptable for Xpert Xpress SARS-CoV-2/FLU/RSV testing.  Fact Sheet for Patients: EntrepreneurPulse.com.au  Fact Sheet for Healthcare Providers: IncredibleEmployment.be  This test is not yet approved or cleared by the [place] FDA and has been authorized for detection and/or diagnosis of SARS-CoV-2 by FDA under an Emergency Use Authorization (EUA). This EUA will remain in effect (meaning this test can be used) for the duration of the COVID-19 declaration under Section 564(b)(1) of the Act, 21 U.S.C. section 360bbb-3(b)(1), unless the authorization is terminated or revoked.  Performed at [facility], [address] [place], [place] [address]   MRSA PCR Screening     Status: None   Collection Time: 12/15/19  7:04 PM   Specimen: Nasal Mucosa; Nasopharyngeal  Result Value Ref Range Status   MRSA by PCR NEGATIVE NEGATIVE Final    Comment:        The GeneXpert MRSA Assay (FDA approved for NASAL specimens only), is one component of a comprehensive MRSA colonization surveillance program. It is not intended to diagnose MRSA infection nor to guide or monitor treatment for MRSA infections. Performed at [facility], [address] [place],  [address]           IMAGING    EEG  Result Date: 12/17/2019 Lora Havens, MD     12/17/2019  3:14 PM Patient Name: Adrian HEYER MRN: [id number] Epilepsy Attending: Lora Havens Referring Physician/Provider: Dr Annice Pih Date: 12/17/2019 Duration: 29 mins Patient history: 59 year old male with an extensive PMHx including alcoholic liver disease and epilepsy, presenting with  breakthrough seizures. EEG to evaluate for seizure. Level of alertness:  comatose AEDs during EEG study: ZNS, LEV, Versed, Ativan Technical aspects: This EEG study was done with scalp electrodes positioned according to the 10-20 International system of electrode placement. Electrical activity was acquired at a sampling rate of 500Hz  and reviewed with a high frequency filter of 70Hz  and a low frequency filter of 1Hz . EEG data were recorded continuously and digitally stored. Description: EEG showed continuous generalized polymorphic mixed frequencies with predominantly 5-8hz  theta-alpha activity as well as 2-3hz  delta slowing. Sharp transient was seen in right cenrotemporal region. Hyperventilation and photic stimulation were not performed. ABNORMALITY -Continuous slow, generalized IMPRESSION: This study is suggestive of severe diffuse encephalopathy, nonspecific etiology but could be related to sedation. No seizures or epileptiform discharges were seen throughout the recording. Lora Havens   DG Abd 1 View  Result Date: 12/17/2019 CLINICAL DATA:  NG tube placement EXAM: ABDOMEN - 1 VIEW COMPARISON:  12/14/2019 FINDINGS: Esophageal tube tip and side port overlie the gastric fundal region. Stent in the right upper quadrant. Mild atelectasis left base. IMPRESSION: Esophageal tube tip overlies the gastric fundus. Electronically Signed   By: Donavan Foil M.D.   On: 12/17/2019 17:28   DG Chest Port 1 View  Result Date: 12/17/2019 CLINICAL DATA:  Endotracheal tube placement. EXAM: PORTABLE CHEST 1 VIEW COMPARISON:  December 15, 2019. FINDINGS: Stable cardiomediastinal silhouette. Endotracheal and nasogastric tubes are unchanged in position. Right-sided PICC line is noted with distal tip in expected position of the SVC. No pneumothorax or pleural effusion is noted. Lungs  are clear. Old left rib fractures are noted. IMPRESSION: Stable support apparatus. No acute cardiopulmonary abnormality seen. Electronically  Signed   By: Marijo Conception M.D.   On: 12/17/2019 14:00     Nutrition Status: Nutrition Problem: Moderate Malnutrition Etiology: chronic illness (EtOH abuse, cirrhosis, COPD, metastatic hepatocellular carcinoma) Signs/Symptoms: moderate muscle depletion, moderate fat depletion Interventions: Tube feeding, Prostat  CBC    Component Value Date/Time   WBC 8.8 12/18/2019 0633   RBC 3.14 (L) 12/18/2019 0633   HGB 11.4 (L) 12/18/2019 0633   HGB 12.6 (L) 11/10/2013 0640   HCT 33.1 (L) 12/18/2019 0633   HCT 36.3 (L) 11/10/2013 0640   PLT 80 (L) 12/18/2019 0633   PLT 108 (L) 11/10/2013 0640   MCV 105.4 (H) 12/18/2019 0633   MCV 96 11/10/2013 0640   MCH 36.3 (H) 12/18/2019 0633   MCHC 34.4 12/18/2019 0633   RDW 15.1 12/18/2019 0633   RDW 14.1 11/10/2013 0640   LYMPHSABS 0.8 12/20/2019 1830   LYMPHSABS 2.6 11/10/2013 0640   MONOABS 0.5 01/05/2020 1830   MONOABS 1.2 (H) 11/10/2013 0640   EOSABS 0.2 01/07/2020 1830   EOSABS 0.1 11/10/2013 0640   BASOSABS 0.0 12/12/2019 1830   BASOSABS 0.1 11/10/2013 0640   BMP Latest Ref Rng & Units 12/18/2019 12/16/2019 12/15/2019  Glucose 70 - 99 mg/dL 119(H) 99 98  BUN 6 - 20 mg/dL 21(H) 18 10  Creatinine 0.61 - 1.24 mg/dL 0.60(L) 0.69 0.62  Sodium 135 - 145 mmol/L 142 141 142  Potassium 3.5 - 5.1 mmol/L 3.7 3.7 3.7  Chloride 98 - 111 mmol/L 110 110 110  CO2 22 - 32 mmol/L 26 24 21(L)  Calcium 8.9 - 10.3 mg/dL 8.6(L) 8.5(L) 8.4(L)      Indwelling Urinary Catheter continued, requirement due to   Reason to continue Indwelling Urinary Catheter strict Intake/Output monitoring for hemodynamic instability   Central Line/ continued, requirement due to  Reason to continue [employer] of central venous pressure or other hemodynamic parameters and poor IV access   Ventilator continued, requirement due to severe respiratory failure   Ventilator Sedation RASS 0 to -2      ASSESSMENT AND PLAN SYNOPSIS   Severe ACUTE Hypoxic and  Hypercapnic Respiratory Failure Status epilepticus, known seizure disorder with breakthrough seizures Breakthrough on 2 anticonvulsants Recent alcohol binge, patient with metastatic liver cancer   Severe ACUTE Hypoxic and Hypercapnic Respiratory Failure -continue Full MV support -continue Bronchodilator Therapy -Wean Fio2 and PEEP as tolerated -VAP/VENT bundle implementation   Morbid obesity, possible OSA.   Will certainly impact respiratory mechanics, ventilator weaning Suspect will need to consider additional PEEP    NEUROLOGY Acute toxic metabolic encephalopathy, need for sedation Goal RASS -2 to -3 Follow up neurologist consultation  CARDIAC ICU monitoring  ID -continue IV abx as prescibed -follow up cultures  GI End stage liver disease and liver cirrhosis  NUTRITIONAL STATUS Nutrition Status: Nutrition Problem: Moderate Malnutrition Etiology: chronic illness (EtOH abuse, cirrhosis, COPD, metastatic hepatocellular carcinoma) Signs/Symptoms: moderate muscle depletion, moderate fat depletion Interventions: Tube feeding, Prostat   DIET-->TF's as tolerated Constipation protocol as indicated  ENDO - will use ICU hypoglycemic\Hyperglycemia protocol if indicated     ELECTROLYTES -follow labs as needed -replace as needed -pharmacy consultation and following   DVT/GI PRX ordered and assessed TRANSFUSIONS AS NEEDED MONITOR FSBS I Assessed the need for Labs I Assessed the need for Foley I Assessed the need for Central Venous Line Family Discussion when available I Assessed  the need for Mobilization I made an Assessment of medications to be adjusted accordingly Safety Risk assessment completed   CASE DISCUSSED IN MULTIDISCIPLINARY ROUNDS WITH ICU TEAM  Critical Care Time devoted to patient care services described in this note is 56 minutes.   Overall, patient is critically ill, prognosis is guarded.  Patient with Multiorgan failure and at high risk for  cardiac arrest and death.   Recommend DNR status and comfort care measures  Corrin Parker, M.D.  [facility]  Medical Director [place] Director [facility] Cardio-Pulmonary Department

## 2019-12-18 NOTE — Progress Notes (Signed)
NEUROLOGY CONSULTATION PROGRESS NOTE   Date of service: December 18, 2019 Patient Name: Adrian Hale MRN:  [id number] DOB:  12-04-1960  Brief HPI  Adrian Hale is a 59 year old male with an extensive PMHx including alcoholic liver disease and epilepsy, presenting with breakthrough seizures and status. Overall impression is that his presentation was precipitated by acute alcohol withdrawal. MRI with  cortical restricted diffusion in parietal lobes symmetrically; cortical edema from seizure is highest on the DDx and is also likely contributing to his obtunded state. PRES is felt to be unlikely.  Currently on Versed at a rate of 10. Keppra 1500mg  BID and Zonegran 400mg  QHS.   We will plan on Ativan taper with 2 mg IV q6h x 4 doses, then 1 mg IV q6h x 8 doses when weaning down Versed.  rEEG completed on Monday 12/6 and negative for seizures. Notable for severe diffuse encephalopathy.   Interval Hx   ICU team working on extubation.  Vitals   Vitals:   12/18/19 0400 12/18/19 0433 12/18/19 0500 12/18/19 0600  BP: 111/66  112/71 103/63  Pulse: 65  65 65  Resp: (!) 21  20 19   Temp: 99.1 F (37.3 C)     TempSrc: Oral     SpO2: 96%  97% 98%  Weight:  77.6 kg    Height:         Body mass index is 23.2 kg/m.  Physical Exam   General: Laying comfortably in bed; in no acute distress. HENT: Normal oropharynx and mucosa. Normal external appearance of ears and nose. Neck: Supple, no pain or tenderness CV: No JVD. No peripheral edema. Pulmonary: Symmetric Chest rise. Normal respiratory effort. Abdomen: palpable enlarged lower liver border, non-tender. Ext: No cyanosis, edema, or deformity Skin: No rash. Normal palpation of skin.  Musculoskeletal: Normal digits and nails by inspection. No clubbing.  Neurologic Examination  Mental status/Cognition: intubated on versed  Brainstem: corneals intact BL. Cough: intact Gag: intact Pupils equal round and reactive to  light. Symmetric grimace to pain BL.  Motor: Moves all extremities spontaneously and localizes to pain in all extremities. Responds to pain in all extremities.  Labs   Basic Metabolic Panel:  Lab Results  Component Value Date   NA 142 12/18/2019   K 3.7 12/18/2019   CO2 26 12/18/2019   GLUCOSE 119 (H) 12/18/2019   BUN 21 (H) 12/18/2019   CREATININE 0.60 (L) 12/18/2019   CALCIUM 8.6 (L) 12/18/2019   GFRNONAA >60 12/18/2019   GFRAA >60 07/13/2019   HbA1c: No results found for: HGBA1C LDL: No results found for: [facility] Urine Drug Screen:     Component Value Date/Time   LABOPIA NONE DETECTED 12/20/2019 1911   COCAINSCRNUR NONE DETECTED 12/25/2019 1911   LABBENZ POSITIVE (A) 12/12/2019 1911   AMPHETMU NONE DETECTED 01/10/2020 1911   THCU POSITIVE (A) 12/14/2019 1911   LABBARB NONE DETECTED 01/04/2020 1911    Alcohol Level     Component Value Date/Time   ETH <10 12/21/2019 1830   Lab Results  Component Value Date   PHENYTOIN 8.2 (L) 07/13/2011   Lab Results  Component Value Date   PHENYTOIN 8.2 (L) 07/13/2011    Imaging and Diagnostic studies   MRI Brain without contrast: IMPRESSION: 1. Cortical diffusion restriction along the cingulate gyri and within the posterior parietal lobe. This may be a postictal phenomenon. Posterior reversible encephalopathy syndrome (PRES) is also a possibility. 2. No hemorrhage or mass effect. 3. Hyperintense T1-weighted signal within  the globi pallidi, most commonly due to manganese deposition in the setting of chronic liver Disease.  rEEG: This study is suggestive of severe diffuse encephalopathy, nonspecific etiology but could be related to sedation. No seizures or epileptiform discharges were seen throughout the recording.  Impression   Adrian Hale is a73 year old male with an extensive PMHx including alcoholic liver disease and epilepsy, presenting with breakthrough seizures. Overall impression is that his  presentation was precipitated by acute alcohol withdrawal. No further clinical events concerning for seizures and rEEG with no seizures.  Team planning on weaning down Versed.  Recommendations  - Ativan taper after Versed is off with 2 mg IV q6h x 4 doses, then 1 mg IV q6h x 8 doses. Would recommend that this be ordered after Versed is off. - continue Keppra 1500mg  BID and Zonegran 400mg  QHS. - Ammonia is trending down. - We will continue to follow along. ______________________________________________________________________   Thank you for the opportunity to take part in the care of this patient. If you have any further questions, please contact the neurology consultation attending.  Signed,  [place] Pager Number [phone]

## 2019-12-18 NOTE — Progress Notes (Signed)
Extubation order written for one way extubation.  Patient extubated and placed on 2lpm [place] for comfort.

## 2019-12-18 NOTE — Progress Notes (Signed)
Silver colored, wedding band removed from left ring finger and given to pt's wife Aura Dials per her request. She states that she will wear it around her neck if he passes away or will return to pt if he survives.

## 2019-12-19 DIAGNOSIS — J9601 Acute respiratory failure with hypoxia: Secondary | ICD-10-CM | POA: Diagnosis not present

## 2019-12-19 DIAGNOSIS — E440 Moderate protein-calorie malnutrition: Secondary | ICD-10-CM | POA: Diagnosis not present

## 2019-12-19 DIAGNOSIS — K7290 Hepatic failure, unspecified without coma: Secondary | ICD-10-CM | POA: Diagnosis not present

## 2019-12-19 LAB — BASIC METABOLIC PANEL
Anion gap: 5 (ref 5–15)
BUN: 25 mg/dL — ABNORMAL HIGH (ref 6–20)
CO2: 28 mmol/L (ref 22–32)
Calcium: 8.7 mg/dL — ABNORMAL LOW (ref 8.9–10.3)
Chloride: 110 mmol/L (ref 98–111)
Creatinine, Ser: 0.58 mg/dL — ABNORMAL LOW (ref 0.61–1.24)
GFR, Estimated: >60 mL/min (ref >60)
Glucose, Bld: 82 mg/dL (ref 70–99)
Potassium: 4.2 mmol/L (ref 3.5–5.1)
Sodium: 143 mmol/L (ref 135–145)

## 2019-12-19 LAB — GLUCOSE, CAPILLARY
Glucose-Capillary: 80 mg/dL (ref 70–99)
Glucose-Capillary: 92 mg/dL (ref 70–99)

## 2019-12-19 LAB — CBC
HCT: 36.3 % — ABNORMAL LOW (ref 39.0–52.0)
Hemoglobin: 12.1 g/dL — ABNORMAL LOW (ref 13.0–17.0)
MCH: 36.4 pg — ABNORMAL HIGH (ref 26.0–34.0)
MCHC: 33.3 g/dL (ref 30.0–36.0)
MCV: 109.3 fL — ABNORMAL HIGH (ref 80.0–100.0)
Platelets: 107 10*3/uL — ABNORMAL LOW (ref 150–400)
RBC: 3.32 MIL/uL — ABNORMAL LOW (ref 4.22–5.81)
RDW: 15.2 % (ref 11.5–15.5)
WBC: 15.4 10*3/uL — ABNORMAL HIGH (ref 4.0–10.5)
nRBC: 0 % (ref 0.0–0.2)

## 2019-12-19 LAB — MAGNESIUM: Magnesium: 2 mg/dL (ref 1.7–2.4)

## 2019-12-19 LAB — PHOSPHORUS: Phosphorus: 4.5 mg/dL (ref 2.5–4.6)

## 2019-12-19 MED ORDER — IPRATROPIUM-ALBUTEROL 0.5-2.5 (3) MG/3ML IN SOLN: 3.0000 mL | Freq: Four times a day (QID) | RESPIRATORY_TRACT | Status: DC | PRN

## 2019-12-19 MED ORDER — GLYCOPYRROLATE 0.2 MG/ML IJ SOLN: 0.2000 mg | INTRAMUSCULAR | Status: DC | PRN

## 2019-12-19 MED ORDER — MORPHINE BOLUS VIA INFUSION
1.0000 mg | INTRAVENOUS | Status: DC | PRN
  Filled 2019-12-19: qty 2

## 2019-12-19 MED ORDER — HALOPERIDOL LACTATE 5 MG/ML IJ SOLN: 0.5000 mg | INTRAMUSCULAR | Status: DC | PRN

## 2019-12-19 MED ORDER — LORAZEPAM 2 MG/ML IJ SOLN: 1.0000 mg | INTRAMUSCULAR | Status: DC | PRN

## 2019-12-28 LAB — BLOOD GAS, ARTERIAL
Acid-base deficit: 2 mmol/L (ref 0.0–2.0)
Bicarbonate: 22.2 mmol/L (ref 20.0–28.0)
FIO2: 0.5
MECHVT: 500 mL
O2 Saturation: 98.3 %
PEEP: 5 cmH2O
Patient temperature: 37
RATE: 15 resp/min
pCO2 arterial: 35 mmHg (ref 32.0–48.0)
pH, Arterial: 7.41 (ref 7.350–7.450)
pO2, Arterial: 110 mmHg — ABNORMAL HIGH (ref 83.0–108.0)

## 2020-01-12 NOTE — Progress Notes (Signed)
Pts wife Adrian Hale arrived at bedside per my request to discuss transitioning pt to comfort measures only. Pts respiratory status has declined throughout the night, and he now appears at the end of life.  Following discussion pts wife would like to transition pt to comfort measures only, therefore will place orders.  Marda Stalker, [place] Pager [phone] (please enter 7 digits) PCCM Consult Pager [phone] (please enter 7 digits)

## 2020-01-12 NOTE — Death Summary Note (Signed)
DEATH SUMMARY   Patient Details  Name: Adrian Hale MRN: [id number] DOB: September 06, 1960  Admission/Discharge Information   Admit Date:  21-Dec-2019  Date of Death: Date of Death: 2019-12-27  Time of Death: Time of Death: 0945  Length of Stay: Mar 25, 2022  Referring Physician: [facility]   Reason(s) for Hospitalization  LIVER FAILURE  Diagnoses  Preliminary cause of death: LIVER CANCER WITH METS, LIVER CIRRHOSIS WITH ETOH ABUSE Secondary Diagnoses (including complications and co-morbidities):  Active Problems:   Hepatic encephalopathy (HCC)   Seizure disorder (HCC)   Alcohol use disorder, severe, dependence ([place])   Encounter for intubation   Malnutrition of moderate degree   Brief Hospital Course (including significant findings, care, treatment, and services provided and events leading to death)   60 y.o.male,current smoker (tobacco/marijuana) with a history of alcoholic cirrhosis, portal hypertension (status post TIPS),seizure disorder andmetastatic hepatocellular carcinomawho presented [facility] because of breakthrough seizures.Patient developed status epilepticus and had to be transferred to the ICU and intubated for Versed infusion. PCCM asked to assume care in ICU.  Events: 12/15/2019: Admitted to ICU in status epilepticus, intubated 12/16/2019: Transfer to [facility] canceled per wife's request patient's condition has stabilized, will continue to treat at [facility] 12/6 remains encephlopathic 27-Dec-2022 transitioned to Comfort care   PATIENT WITH END STAGE LIVER CIRRHOSIS AND LIVER CANCER  GOALS OF CARE DISCUSSION  The Clinical status was relayed to family in detail.  Updated and notified of patients medical condition.  Patient remains unresponsive and will not open eyes to command.   Upon assessment his breath sounds are course crackles with significant secretions to oral pharyngeal region.  Nasopharyngeal suction produced copious sanguineous  secretions.    Patient is having a weak cough and struggling to remove secretions.   patient with increased WOB and using accessory muscles to breathe Explained to family course of therapy and the modalities     Patient with Progressive multiorgan failure with very low chance of meaningful recovery despite all aggressive and optimal medical therapy. Patient is in the Dying  Process associated with Suffering.  Family understands the situation.  They have consented and agreed to DNR/DNI and would like to proceed with Comfort care measures.  Family are satisfied with Plan of action and management. All questions answered  TOD 945AM on 12-27-19      Pertinent Labs and Studies  Significant Diagnostic Studies EEG  Result Date: 12/17/2019 Lora Havens, MD     12/17/2019  3:14 PM Patient Name: Adrian Hale MRN: [id number] Epilepsy Attending: Lora Havens Referring Physician/Provider: Dr Annice Pih Date: 12/17/2019 Duration: 29 mins Patient history: 60 year old male with an extensive PMHx including alcoholic liver disease and epilepsy, presenting with breakthrough seizures. EEG to evaluate for seizure. Level of alertness:  comatose AEDs during EEG study: ZNS, LEV, Versed, Ativan Technical aspects: This EEG study was done with scalp electrodes positioned according to the 10-20 International system of electrode placement. Electrical activity was acquired at a sampling rate of 500Hz  and reviewed with a high frequency filter of 70Hz  and a low frequency filter of 1Hz . EEG data were recorded continuously and digitally stored. Description: EEG showed continuous generalized polymorphic mixed frequencies with predominantly 5-8hz  theta-alpha activity as well as 2-3hz  delta slowing. Sharp transient was seen in right cenrotemporal region. Hyperventilation and photic stimulation were not performed. ABNORMALITY -Continuous slow, generalized IMPRESSION: This study is suggestive of severe diffuse  encephalopathy, nonspecific etiology but could be related to sedation.  No seizures or epileptiform discharges were seen throughout the recording. Lora Havens   DG Abd 1 View  Result Date: 12/17/2019 CLINICAL DATA:  NG tube placement EXAM: ABDOMEN - 1 VIEW COMPARISON:  12/14/2019 FINDINGS: Esophageal tube tip and side port overlie the gastric fundal region. Stent in the right upper quadrant. Mild atelectasis left base. IMPRESSION: Esophageal tube tip overlies the gastric fundus. Electronically Signed   By: Donavan Foil M.D.   On: 12/17/2019 17:28   DG Abdomen 1 View  Result Date: 12/14/2019 CLINICAL DATA:  NG tube placement EXAM: ABDOMEN - 1 VIEW COMPARISON:  None. FINDINGS: The bowel gas pattern is normal. NG tube tip is seen within the proximal stomach. Side hole seen within the distal GE junction. No radio-opaque calculi or other significant radiographic abnormality are seen. IMPRESSION: NG tube tip seen just entering the proximal stomach and could be advanced several cm. Electronically Signed   By: Prudencio Pair M.D.   On: 12/14/2019 19:46   CT Head Wo Contrast  Result Date: 12/14/2019 CLINICAL DATA:  Seizure. Tonic colonic seizure lasting approximately 15-20 minutes. EXAM: CT HEAD WITHOUT CONTRAST TECHNIQUE: Contiguous axial images were obtained from the base of the skull through the vertex without intravenous contrast. COMPARISON:  07/11/2019 FINDINGS: Brain: No evidence of acute infarction, hemorrhage, hydrocephalus, extra-axial collection or mass lesion/mass effect. Mild prominence of the sulci and ventricles. Vascular: No hyperdense vessel or unexpected calcification. Skull: Normal. Negative for fracture or focal lesion. Sinuses/Orbits: No acute finding. Other: None. IMPRESSION: 1. No acute intracranial abnormalities. 2. Mild brain atrophy. Electronically Signed   By: Kerby Moors M.D.   On: 12/27/2019 19:19   MR BRAIN W WO CONTRAST  Result Date: 12/16/2019 CLINICAL DATA:  Seizure  EXAM: MRI HEAD WITHOUT AND WITH CONTRAST TECHNIQUE: Multiplanar, multiecho pulse sequences of the brain and surrounding structures were obtained without and with intravenous contrast. CONTRAST:  7.75mL GADAVIST GADOBUTROL 1 MMOL/ML IV SOLN COMPARISON:  None. FINDINGS: Brain: There is cortical diffusion restriction along the cingulate gyri and within the posterior parietal lobe. No acute or chronic hemorrhage. There is multifocal hyperintense T2-weighted signal within the white matter. Parenchymal volume and CSF spaces are normal. There is hyperintense T1-weighted signal within the globi pallidi. Midline structures are normal. There is no abnormal contrast enhancement. Vascular: Major flow voids are preserved. Skull and upper cervical spine: Normal calvarium and skull base. Visualized upper cervical spine and soft tissues are normal. Sinuses/Orbits:No paranasal sinus fluid levels or advanced mucosal thickening. No mastoid or middle ear effusion. Normal orbits. IMPRESSION: 1. Cortical diffusion restriction along the cingulate gyri and within the posterior parietal lobe. This may be a postictal phenomenon. Posterior reversible encephalopathy syndrome (PRES) is also a possibility. 2. No hemorrhage or mass effect. 3. Hyperintense T1-weighted signal within the globi pallidi, most commonly due to manganese deposition in the setting of chronic liver disease. Electronically Signed   By: Ulyses Jarred M.D.   On: 12/16/2019 01:07   DG Chest Port 1 View  Result Date: 12/17/2019 CLINICAL DATA:  Endotracheal tube placement. EXAM: PORTABLE CHEST 1 VIEW COMPARISON:  December 15, 2019. FINDINGS: Stable cardiomediastinal silhouette. Endotracheal and nasogastric tubes are unchanged in position. Right-sided PICC line is noted with distal tip in expected position of the SVC. No pneumothorax or pleural effusion is noted. Lungs are clear. Old left rib fractures are noted. IMPRESSION: Stable support apparatus. No acute cardiopulmonary  abnormality seen. Electronically Signed   By: Marijo Conception M.D.   On: 12/17/2019  14:00   DG Chest Port 1 View  Result Date: 12/15/2019 CLINICAL DATA:  Endotracheal tube placement EXAM: PORTABLE CHEST 1 VIEW COMPARISON:  November 23, 2019 FINDINGS: The endotracheal tube terminates above the carina by approximately 3.2 cm. The enteric tube extends below the left hemidiaphragm. The heart size is enlarged. The pulmonary vasculature is dilated centrally. There is vascular congestion without overt pulmonary edema. There are old healed left-sided rib fractures. There is no pneumothorax. The patient is status post prior TIPS placement. IMPRESSION: 1. Lines and tubes as above. 2. Cardiomegaly with vascular congestion. No overt pulmonary edema. Electronically Signed   By: Constance Holster M.D.   On: 12/15/2019 19:15   DG Chest Portable 1 View  Result Date: 11/23/2019 CLINICAL DATA:  Left chest pain EXAM: PORTABLE CHEST 1 VIEW COMPARISON:  07/12/2019 FINDINGS: Borderline heart size. Unremarkable aortic contours. Borderline vascular congestion. There is no edema, consolidation, effusion, or pneumothorax. Remote and healed left rib fractures. IMPRESSION: Borderline vascular congestion. Electronically Signed   By: Monte Fantasia M.D.   On: 11/23/2019 07:12   CT Angio Chest/Abd/Pel for Dissection W and/or Wo Contrast  Result Date: 11/23/2019 CLINICAL DATA:  Chest and back pain. History of cirrhosis and tips procedure. EXAM: CT ANGIOGRAPHY CHEST, ABDOMEN AND PELVIS TECHNIQUE: Non-contrast CT of the chest was initially obtained. Multidetector CT imaging through the chest, abdomen and pelvis was performed using the standard protocol during bolus administration of intravenous contrast. Multiplanar reconstructed images and MIPs were obtained and reviewed to evaluate the vascular anatomy. CONTRAST:  152mL OMNIPAQUE IOHEXOL 350 MG/ML SOLN COMPARISON:  CT scan 05/25/2010 FINDINGS: CTA CHEST FINDINGS Cardiovascular: The  heart is normal in size. No pericardial effusion. There is mild tortuosity and calcification of the thoracic aorta but no focal aneurysm or dissection. The aortic branch vessels are patent. Scattered calcifications. Scattered coronary artery calcifications. The pulmonary arteries are enlarged suggesting pulmonary hypertension. No filling defects to suggest pulmonary embolism. Mediastinum/Nodes: Small scattered mediastinal and hilar lymph nodes but no mass or overt adenopathy. The esophagus is grossly normal. Lungs/Pleura: Innumerable small bilateral pulmonary nodules worrisome for metastatic disease. No pulmonary mass or pleural effusion. Musculoskeletal: No acute bony findings. No destructive bone lesions. Remote healed rib fractures are noted. Mild asymmetric left-sided gynecomastia is noted. Recommend clinical correlation. No supraclavicular or axillary adenopathy. Review of the MIP images confirms the above findings. CTA ABDOMEN AND PELVIS FINDINGS VASCULAR Aorta: Moderate age advanced atherosclerotic calcifications but no aneurysm or dissection. Celiac: Small ostial calcifications but no stenosis. SMA: Moderate ostial calcifications but no significant stenosis. Renals: Bilateral ostial calcifications but no significant stenosis. IMA: Patent. Inflow: Moderate atherosclerotic calcifications involving both common iliac arteries but no aneurysm or dissection. Veins: The major venous structures are grossly patent. Review of the MIP images confirms the above findings. NON-VASCULAR Hepatobiliary: Advanced cirrhotic changes involving the liver. Evidence of a tips procedure with a portal vein to IVC shunt. There are numerous low-attenuation lesions in the liver. Most of these measure is simple cyst. I do not see any definite worrisome hepatic lesions. The gallbladder is moderately distended. There are small layering gallstones noted. No common bile duct dilatation. Pancreas: No mass, inflammation or ductal dilatation.  Spleen: Mild splenomegaly. Small splenic lesions are noted. Adrenals/Urinary Tract: There is a large partially necrotic enhancing left adrenal gland mass measuring approximately 10 x 8.5 x 7.4 cm. There is some mass effect on the upper pole region of the left kidney and also on the pancreas but no direct invasion. 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 The right adrenal gland is normal. Both kidneys are unremarkable. No renal lesions or hydronephrosis. The bladder is unremarkable. Stomach/Bowel: Stomach, duodenum, small bowel and colon are grossly normal without oral contrast. No acute inflammatory changes, mass lesions or obstructive findings. The terminal ileum is normal. The appendix is normal. Colonic diverticulosis without findings for acute diverticulitis. Lymphatic: Small scattered mesenteric and retroperitoneal lymph nodes but no overt adenopathy. Reproductive: The prostate gland and seminal vesicles are unremarkable. Other: No free pelvic fluid collections or pelvic adenopathy. No inguinal adenopathy. Musculoskeletal: There are few small scattered sclerotic bone lesions noted in the spine and pelvis in the fourth right posterior rib. These were also present on the prior CT scan from 2012 but were much smaller. There are likely benign bone islands. Review of the MIP images confirms the above findings. IMPRESSION: 1. 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 2. Innumerable small bilateral pulmonary nodules worrisome for metastatic disease. 3. Advanced cirrhotic changes involving the liver and evidence of prior tips shunt. Numerous low-attenuation liver lesions are likely benign cysts. No worrisome hepatic lesions are identified. 4. Scattered sclerotic bone lesions, likely benign bone islands. 5. Age advanced atherosclerotic calcifications involving the thoracic and abdominal aorta and branch vessels but no aneurysm or dissection or significant  stenosis. Aortic Atherosclerosis (ICD10-I70.0). Electronically Signed   By: Marijo Sanes M.D.   On: 11/23/2019 08:02   [place] EKG SITE RITE  Result Date: 12/15/2019 If Site Rite image not attached, placement could not be confirmed due to current cardiac rhythm.   Microbiology Recent Results (from the past 240 hour(s))  Resp Panel by RT-PCR (Flu A&B, Covid) Nasopharyngeal Swab     Status: None   Collection Time: 01/02/2020  8:35 PM   Specimen: Nasopharyngeal Swab; Nasopharyngeal(NP) swabs in vial transport medium  Result Value Ref Range Status   SARS Coronavirus 2 by RT PCR NEGATIVE NEGATIVE Final    Comment: (NOTE) SARS-CoV-2 target nucleic acids are NOT DETECTED.  The SARS-CoV-2 RNA is generally detectable in upper respiratory specimens during the acute phase of infection. The lowest concentration of SARS-CoV-2 viral copies this assay can detect is 138 copies/mL. A negative result does not preclude SARS-Cov-2 infection and should not be used as the sole basis for treatment or other patient management decisions. A negative result may occur with  improper specimen collection/handling, submission of specimen other than nasopharyngeal swab, presence of viral mutation(s) within the areas targeted by this assay, and inadequate number of viral copies(<138 copies/mL). A negative result must be combined with clinical observations, patient history, and epidemiological information. The expected result is Negative.  Fact Sheet for Patients:  EntrepreneurPulse.com.au  Fact Sheet for Healthcare Providers:  IncredibleEmployment.be  This test is no t yet approved or cleared by the [place] FDA and  has been authorized for detection and/or diagnosis of SARS-CoV-2 by FDA under an Emergency Use Authorization (EUA). This EUA will remain  in effect (meaning this test can be used) for the duration of the COVID-19 declaration under Section 564(b)(1) of the Act,  21 U.S.C.section 360bbb-3(b)(1), unless the authorization is terminated  or revoked sooner.       Influenza A by PCR NEGATIVE NEGATIVE Final   Influenza B by PCR NEGATIVE NEGATIVE Final    Comment: (NOTE) The Xpert Xpress SARS-CoV-2/FLU/RSV plus assay is intended as an aid in the diagnosis of influenza from Nasopharyngeal swab specimens and should not be used as a sole  basis for treatment. Nasal washings and aspirates are unacceptable for Xpert Xpress SARS-CoV-2/FLU/RSV testing.  Fact Sheet for Patients: EntrepreneurPulse.com.au  Fact Sheet for Healthcare Providers: IncredibleEmployment.be  This test is not yet approved or cleared by the [place] FDA and has been authorized for detection and/or diagnosis of SARS-CoV-2 by FDA under an Emergency Use Authorization (EUA). This EUA will remain in effect (meaning this test can be used) for the duration of the COVID-19 declaration under Section 564(b)(1) of the Act, 21 U.S.C. section 360bbb-3(b)(1), unless the authorization is terminated or revoked.  Performed at [facility], [address] [place], [place] [address]   MRSA PCR Screening     Status: None   Collection Time: 12/15/19  7:04 PM   Specimen: Nasal Mucosa; Nasopharyngeal  Result Value Ref Range Status   MRSA by PCR NEGATIVE NEGATIVE Final    Comment:        The GeneXpert MRSA Assay (FDA approved for NASAL specimens only), is one component of a comprehensive MRSA colonization surveillance program. It is not intended to diagnose MRSA infection nor to guide or monitor treatment for MRSA infections. Performed at [facility], [address] [place], [place] [address]     Lab Basic Metabolic Panel: Recent Labs  Lab 12/14/19 [phone] 12/15/19 0440 12/15/19 0449 12/15/19 2210 12/16/19 0447 12/17/19 1651 12/18/19 0633 2020-01-16 0519  NA 138 142  --   --  141  --  142 143  K 4.1 3.7  --   --  3.7  --   3.7 4.2  CL 109 110  --   --  110  --  110 110  CO2 21* 21*  --   --  24  --  26 28  GLUCOSE 97 98  --   --  99  --  119* 82  BUN 8 10  --   --  18  --  21* 25*  CREATININE 0.55* 0.62  --   --  0.69  --  0.60* 0.58*  CALCIUM 8.3* 8.4*  --   --  8.5*  --  8.6* 8.7*  MG 1.8  --    < > 2.5* 2.4 1.9 1.9 2.0  PHOS  --   --   --   --   --  2.6 3.2 4.5   < > = values in this interval not displayed.   Liver Function Tests: Recent Labs  Lab 12/31/2019 1830 12/14/19 0539 12/18/19 0633  AST 40 47* 40  ALT 29 27 26   ALKPHOS 151* 142* 134*  BILITOT 1.7* 1.8* 1.9*  PROT 5.1* 5.1* 5.6*  ALBUMIN 2.6* 2.5* 2.6*   No results for input(s): LIPASE, AMYLASE in the last 168 hours. Recent Labs  Lab 12/14/19 0539 12/15/19 0440 12/16/19 0447 12/17/19 0540 12/18/19 0633  AMMONIA 74* 67* 54* 100* 66*   CBC: Recent Labs  Lab 01/06/2020 1830 12/18/2019 1830 12/14/19 0737 12/15/19 0440 12/16/19 0447 12/18/19 0633 Jan 16, 2020 0519  WBC 3.8*   < > 4.5 6.4 10.2 8.8 15.4*  NEUTROABS 2.3  --   --   --   --   --   --   HGB 9.8*   < > 10.0* 10.5* 11.2* 11.4* 12.1*  HCT 26.9*   < > 28.8* 30.4* 32.6* 33.1* 36.3*  MCV 101.9*   < > 104.7* 104.8* 105.2* 105.4* 109.3*  PLT 98*   < > 99* 109* 92* 80* 107*   < > = values in this interval  not displayed.   Cardiac Enzymes: No results for input(s): CKTOTAL, CKMB, CKMBINDEX, TROPONINI in the last 168 hours. Sepsis Labs: Recent Labs  Lab 12/15/19 0440 12/16/19 0447 12/18/19 0633 12/21/2019 0519  WBC 6.4 10.2 8.8 15.4*     Danh Bayus 2019/12/21, 9:56 AM

## 2020-01-12 NOTE — Progress Notes (Addendum)
Patient pronounced dead at 26 by writing Rn and Nancy Fetter, RN. Wife Bethena Roys and two other family members at bedside.  [facility] notified, pt is not a donor, body will be prepared when family departs the hospital.  [place] on funeral release form

## 2020-01-12 NOTE — Progress Notes (Signed)
OT Cancellation Note  Patient Details Name: Adrian Hale MRN: [id number] DOB: Sep 12, 1960   Cancelled Treatment:    Reason Eval/Treat Not Completed: Other (comment)  Pt transitioned to comfort care, OT order d/c'ed by practitioner. Will sign off at this time. Thank you.  Gerrianne Scale, [place], OTR/L ascom [phone] 31-Dec-2019, 8:28 AM

## 2020-01-12 NOTE — Progress Notes (Signed)
CRITICAL CARE NOTE 60 y.o.male,current smoker (tobacco/marijuana) with a history of alcoholic cirrhosis, portal hypertension (status post TIPS),seizure disorder andmetastatic hepatocellular carcinomawho presented to [facility] on 2December because of breakthrough seizures.Patient developed status epilepticus and had to be transferred to the ICU and intubated for Versed infusion. PCCM asked to assume care in ICU.  Events: 12/15/2019: Admitted to ICU in status epilepticus, intubated 12/16/2019: Transfer to [facility] canceled per wife's request patient's condition has stabilized, will continue to treat at [facility] 12/6 remains encephlopathic 12/8 transitioned to Comfort care   CC  follow up respiratory failure  SUBJECTIVE Patient remains critically ill Prognosis is guarded Patient is in dying process   BP 127/76   Pulse 85   Temp 98.4 F (36.9 C) (Axillary)   Resp 10   Ht 6' 0.01" (1.829 m)   Wt 78.2 kg   SpO2 94%   BMI 23.38 kg/m    I/O last 3 completed shifts: In: 1331.8 [I.V.:462; NG/GT:270; IV Piggyback:599.8] Out: 7001 [Urine:1005; Emesis/NG output:650; Stool:1] No intake/output data recorded.  SpO2: 94 % O2 Flow Rate (L/min): 3 L/min FiO2 (%): 30 %  Estimated body mass index is 23.38 kg/m as calculated from the following:   Height as of this encounter: 6' 0.01" (1.829 m).   Weight as of this encounter: 78.2 kg.  SIGNIFICANT EVENTS   REVIEW OF SYSTEMS  PATIENT IS UNABLE TO PROVIDE COMPLETE REVIEW OF SYSTEMS DUE TO SEVERE CRITICAL ILLNESS        PHYSICAL EXAMINATION:  GENERAL:critically ill appearing, +resp distress PULMONARY: +rhonchi, +wheezing CARDIOVASCULAR: S1 and S2. Regular rate and rhythm. No murmurs, rubs, or gallops.  GASTROINTESTINAL: Soft, nontender, -distended.  Positive bowel sounds.   MUSCULOSKELETAL: No swelling, clubbing, or edema.  NEUROLOGIC: obtunded, GCS<8 SKIN:intact,warm,dry  MEDICATIONS: I have reviewed all medications and  confirmed regimen as documented   CULTURE RESULTS   Recent Results (from the past 240 hour(s))  Resp Panel by RT-PCR (Flu A&B, Covid) Nasopharyngeal Swab     Status: None   Collection Time: 12/21/2019  8:35 PM   Specimen: Nasopharyngeal Swab; Nasopharyngeal(NP) swabs in vial transport medium  Result Value Ref Range Status   SARS Coronavirus 2 by RT PCR NEGATIVE NEGATIVE Final    Comment: (NOTE) SARS-CoV-2 target nucleic acids are NOT DETECTED.  The SARS-CoV-2 RNA is generally detectable in upper respiratory specimens during the acute phase of infection. The lowest concentration of SARS-CoV-2 viral copies this assay can detect is 138 copies/mL. A negative result does not preclude SARS-Cov-2 infection and should not be used as the sole basis for treatment or other patient management decisions. A negative result may occur with  improper specimen collection/handling, submission of specimen other than nasopharyngeal swab, presence of viral mutation(s) within the areas targeted by this assay, and inadequate number of viral copies(<138 copies/mL). A negative result must be combined with clinical observations, patient history, and epidemiological information. The expected result is Negative.  Fact Sheet for Patients:  EntrepreneurPulse.com.au  Fact Sheet for Healthcare Providers:  IncredibleEmployment.be  This test is no t yet approved or cleared by the [place] FDA and  has been authorized for detection and/or diagnosis of SARS-CoV-2 by FDA under an Emergency Use Authorization (EUA). This EUA will remain  in effect (meaning this test can be used) for the duration of the COVID-19 declaration under Section 564(b)(1) of the Act, 21 U.S.C.section 360bbb-3(b)(1), unless the authorization is terminated  or revoked sooner.       Influenza A by PCR NEGATIVE NEGATIVE Final  Influenza B by PCR NEGATIVE NEGATIVE Final    Comment: (NOTE) The Xpert  Xpress SARS-CoV-2/FLU/RSV plus assay is intended as an aid in the diagnosis of influenza from Nasopharyngeal swab specimens and should not be used as a sole basis for treatment. Nasal washings and aspirates are unacceptable for Xpert Xpress SARS-CoV-2/FLU/RSV testing.  Fact Sheet for Patients: EntrepreneurPulse.com.au  Fact Sheet for Healthcare Providers: IncredibleEmployment.be  This test is not yet approved or cleared by the [place] FDA and has been authorized for detection and/or diagnosis of SARS-CoV-2 by FDA under an Emergency Use Authorization (EUA). This EUA will remain in effect (meaning this test can be used) for the duration of the COVID-19 declaration under Section 564(b)(1) of the Act, 21 U.S.C. section 360bbb-3(b)(1), unless the authorization is terminated or revoked.  Performed at [facility], [address] [place], [place] [address]   MRSA PCR Screening     Status: None   Collection Time: 12/15/19  7:04 PM   Specimen: Nasal Mucosa; Nasopharyngeal  Result Value Ref Range Status   MRSA by PCR NEGATIVE NEGATIVE Final    Comment:        The GeneXpert MRSA Assay (FDA approved for NASAL specimens only), is one component of a comprehensive MRSA colonization surveillance program. It is not intended to diagnose MRSA infection nor to guide or monitor treatment for MRSA infections. Performed at [facility], [address] [place], [place] [address]           IMAGING    No results found.   Nutrition Status: Nutrition Problem: Moderate Malnutrition Etiology: chronic illness (EtOH abuse, cirrhosis, COPD, metastatic hepatocellular carcinoma) Signs/Symptoms: moderate muscle depletion, moderate fat depletion Interventions: Tube feeding, Prostat  CBC    Component Value Date/Time   WBC 15.4 (H) 2019-12-29 0519   RBC 3.32 (L) 2019/12/29 0519   HGB 12.1 (L) 2019-12-29 0519   HGB 12.6 (L)  11/10/2013 0640   HCT 36.3 (L) 29-Dec-2019 0519   HCT 36.3 (L) 11/10/2013 0640   PLT 107 (L) 12/29/2019 0519   PLT 108 (L) 11/10/2013 0640   MCV 109.3 (H) 12-29-2019 0519   MCV 96 11/10/2013 0640   MCH 36.4 (H) Dec 29, 2019 0519   MCHC 33.3 29-Dec-2019 0519   RDW 15.2 Dec 29, 2019 0519   RDW 14.1 11/10/2013 0640   LYMPHSABS 0.8 12/18/2019 1830   LYMPHSABS 2.6 11/10/2013 0640   MONOABS 0.5 12/14/2019 1830   MONOABS 1.2 (H) 11/10/2013 0640   EOSABS 0.2 12/23/2019 1830   EOSABS 0.1 11/10/2013 0640   BASOSABS 0.0 01/10/2020 1830   BASOSABS 0.1 11/10/2013 0640   BMP Latest Ref Rng & Units Dec 29, 2019 12/18/2019 12/16/2019  Glucose 70 - 99 mg/dL 82 119(H) 99  BUN 6 - 20 mg/dL 25(H) 21(H) 18  Creatinine 0.61 - 1.24 mg/dL 0.58(L) 0.60(L) 0.69  Sodium 135 - 145 mmol/L 143 142 141  Potassium 3.5 - 5.1 mmol/L 4.2 3.7 3.7  Chloride 98 - 111 mmol/L 110 110 110  CO2 22 - 32 mmol/L 28 26 24   Calcium 8.9 - 10.3 mg/dL 8.7(L) 8.6(L) 8.5(L)      Indwelling Urinary Catheter continued, requirement due to   Reason to continue Indwelling Urinary Catheter strict Intake/Output monitoring for hemodynamic instability   Central Line/ continued, requirement due to  Reason to continue [employer] of central venous pressure or other hemodynamic parameters and poor IV access   Ventilator continued, requirement due to severe respiratory failure   Ventilator Sedation RASS 0 to -2  ASSESSMENT AND PLAN SYNOPSIS Severe ACUTE Hypoxic and Hypercapnic Respiratory Failure Status epilepticus, known seizure disorder with breakthrough seizures Breakthrough on 2 anticonvulsants Recent alcohol binge, patient with metastatic liver cancer  Patient is in dying process placed on  Morphine infusion Family coming to bedside    [place] WITH ICU TEAM    Critical Care Time devoted to patient care services described in this note is 45 minutes.     Corrin Parker, M.D.  [facility]  Medical Director [place] Director [facility] Cardio-Pulmonary Department

## 2020-01-12 NOTE — Progress Notes (Signed)
  Chaplain On-Call was paged to the ICU at 0948 hours with report of patient's death, and family is present at bedside. Met patient's wife Bethena Roys, and Judy's brother and sister. Provided spiritual and emotional support and prayer for them.  Chaplain assisted the family in providing [place] information to [place] for the Release Form. Chaplain accompanied the family to the [place] entrance for their departure.  Lamont Romayne Ticas M.Div., [facility]

## 2020-01-12 NOTE — Progress Notes (Signed)
Nutrition Brief Follow-Up Note  Chart reviewed. Patient has transitioned to comfort care.   No further nutrition interventions warranted at this time. Please re-consult RD as needed.   Shaday Rayborn King, MS, RD, LDN Pager number available on Amion  

## 2020-01-12 DEATH — deceased

## 2020-12-05 IMAGING — CT CT HEAD W/O CM
3 series · 16 of 47 positions shown, 19 images · non-contrast
Comparison: None.

CLINICAL DATA: Pt with ams since this am per ems. Pt with history
of cirrhosis, hydrocephalus. Pt complains of headache and is alert
to self and day only. fsbs 119 per ems

EXAM:
CT HEAD WITHOUT CONTRAST
TECHNIQUE: Contiguous axial images were obtained from the base of the skull
through the vertex without intravenous contrast.

[Series 3: head wo · axial · 0.42mm/px · z∈[+207,+332]mm · 10 of 30 slices shown, 13 images]
[im 3/30  brain]
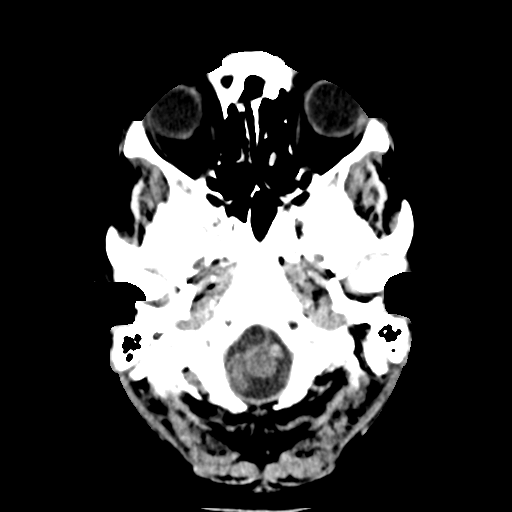
[im 3/30  bone]
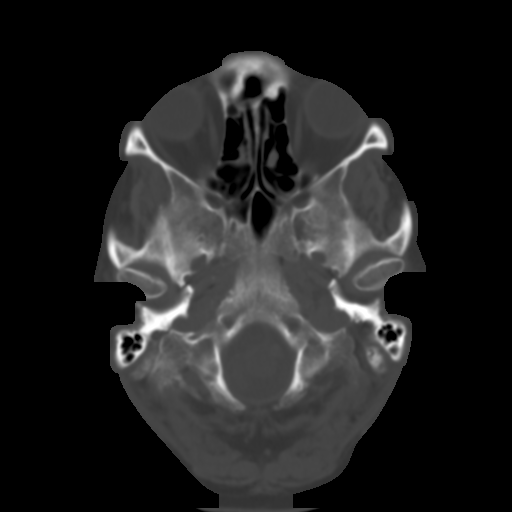
[im 6/30  brain]
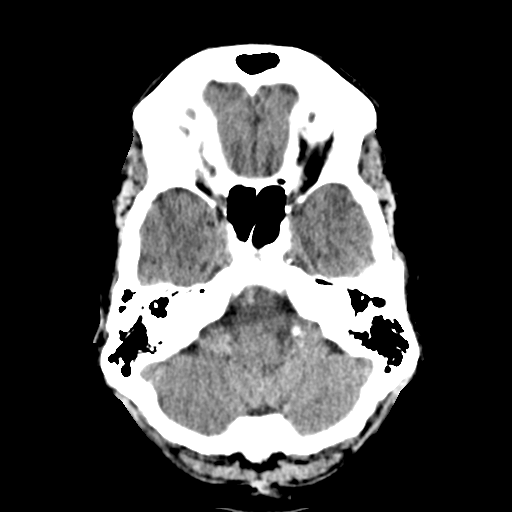
[im 9/30  brain]
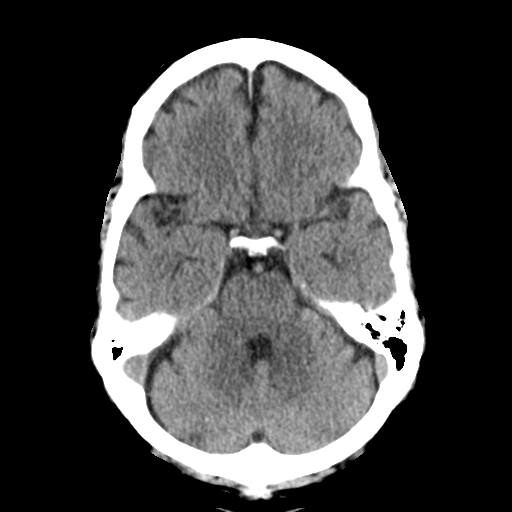
[im 11/30  brain]
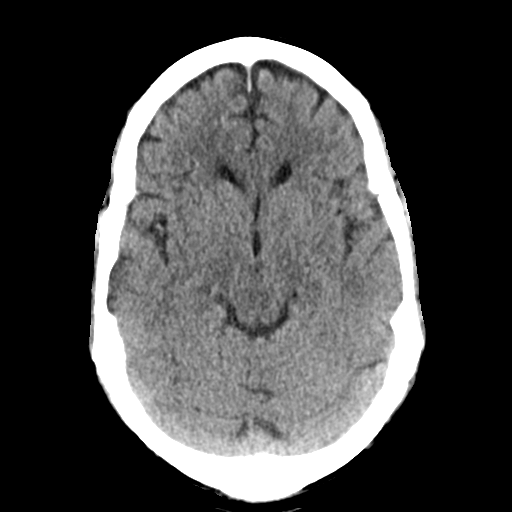
[im 14/30  brain]
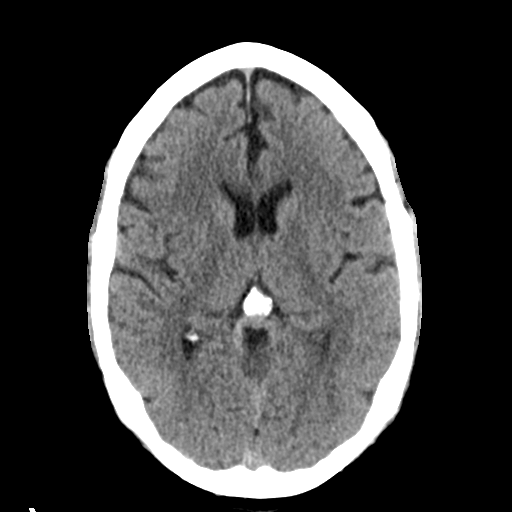
[im 14/30  bone]
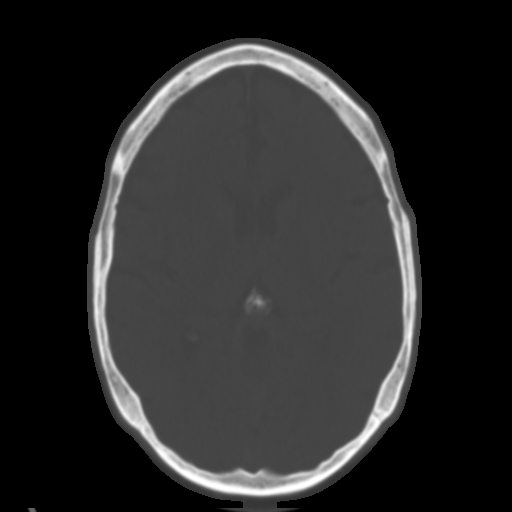
[im 17/30  brain]
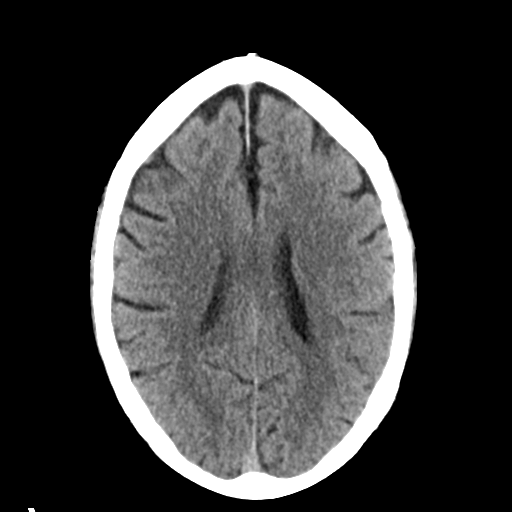
[im 20/30  brain]
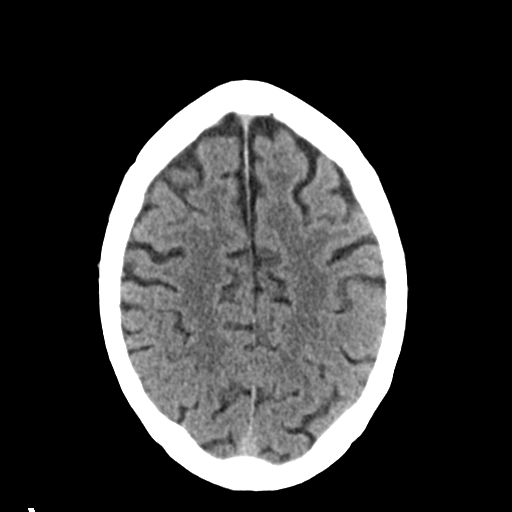
[im 23/30  brain]
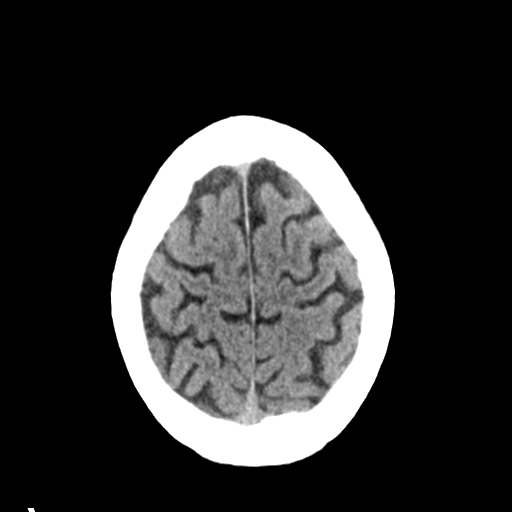
[im 25/30  brain]
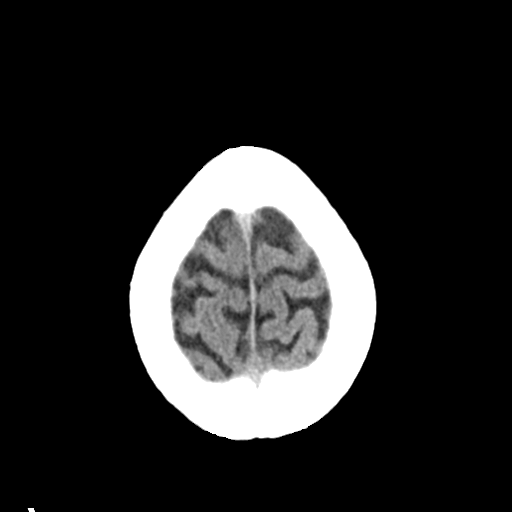
[im 25/30  bone]
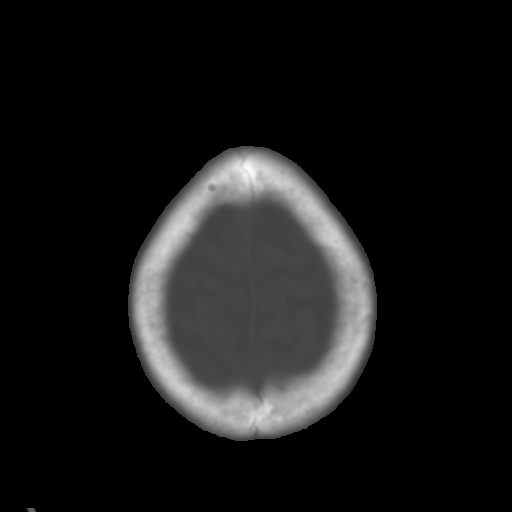
[im 28/30  brain]
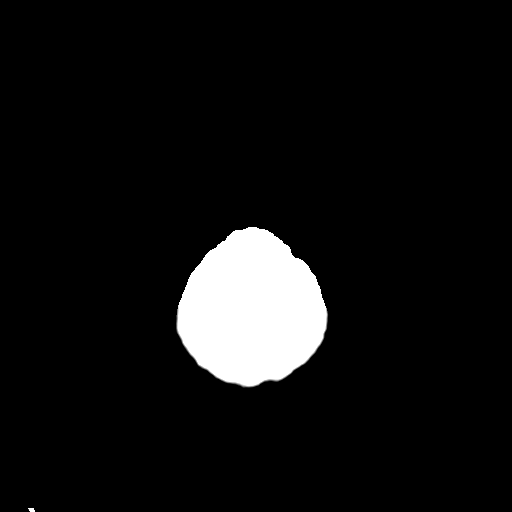

[Series 4: coronal soft tissue · coronal · 0.33mm/px · 3 of 69 slices shown]
[im 23/69  brain]
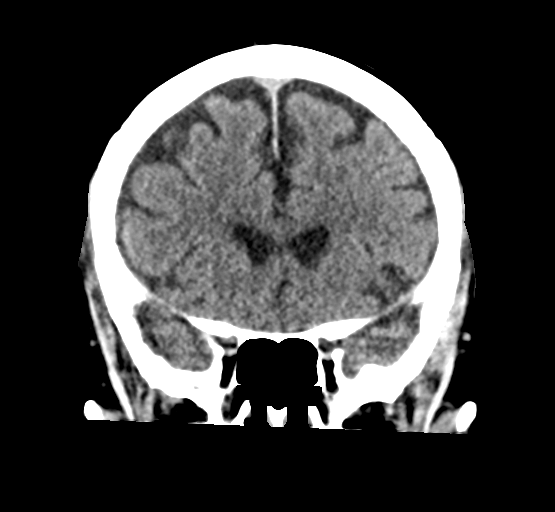
[im 31/69  brain]
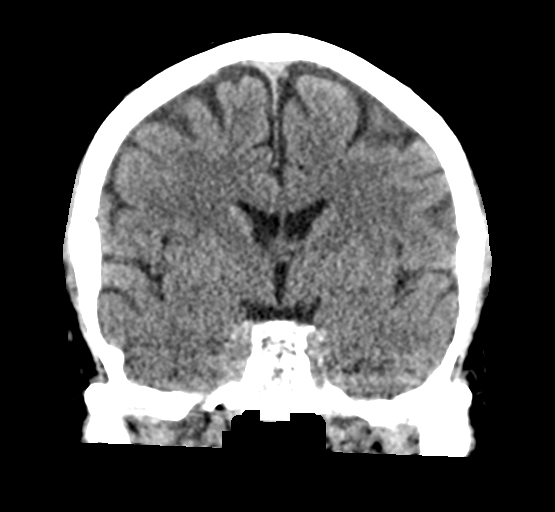
[im 38/69  brain]
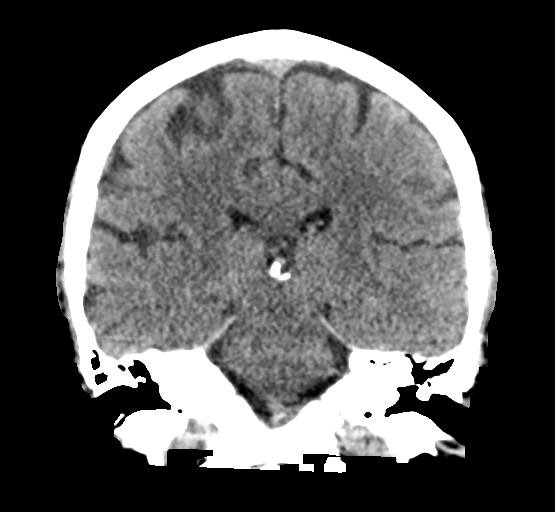

[Series 5: sagittal soft tissue · sagittal · 0.33mm/px · 3 of 61 slices shown]
[im 21/61  brain]
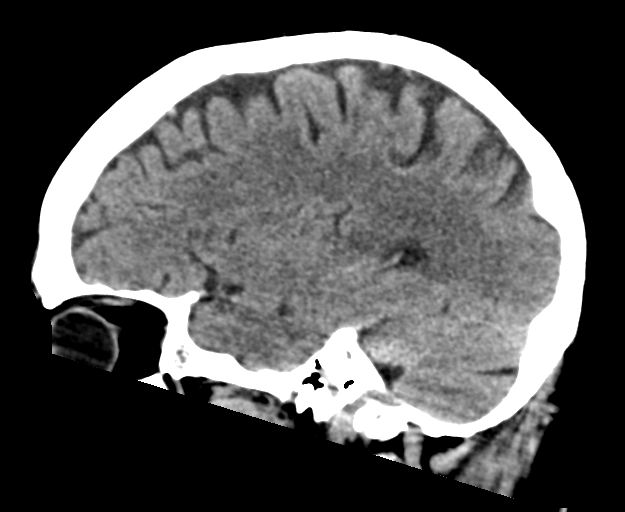
[im 31/61  brain]
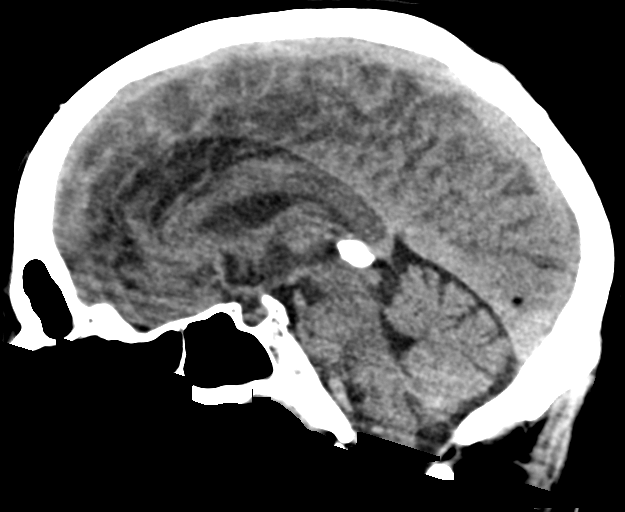
[im 41/61  brain]
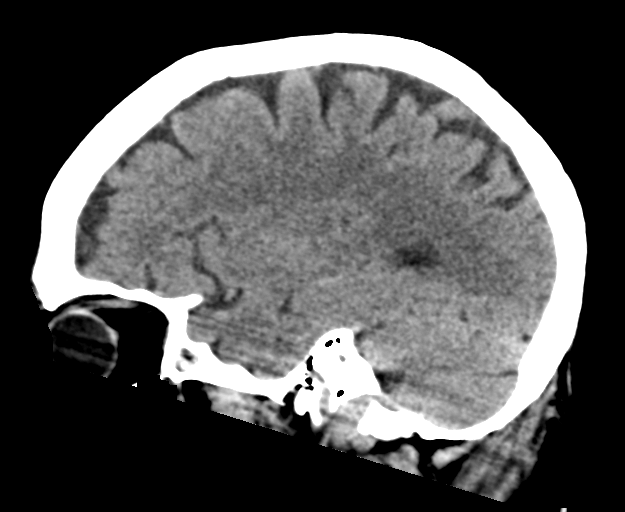

[16 of 47 positions shown; findings below may reference images not displayed]

FINDINGS: Brain: No evidence of acute infarction, hemorrhage, hydrocephalus,
extra-axial collection or mass lesion/mass effect.

Vascular: No hyperdense vessel or unexpected calcification.

Skull: Normal. Negative for fracture or focal lesion.

Sinuses/Orbits: Globes and orbits are unremarkable. Visualized
sinuses and mastoid air cells are clear.

Other: None.
IMPRESSION: Normal unenhanced CT scan of the brain.
